# Patient Record
Sex: Female | Born: 1945 | Race: White | Hispanic: No | Smoking: Former smoker
Health system: Southern US, Community
[De-identification: ages and names within clinical notes are randomized; demographics above are authoritative.]

## PROBLEM LIST (undated history)

## (undated) DIAGNOSIS — E039 Hypothyroidism, unspecified: Secondary | ICD-10-CM

## (undated) DIAGNOSIS — I2789 Other specified pulmonary heart diseases: Secondary | ICD-10-CM

## (undated) DIAGNOSIS — I4891 Unspecified atrial fibrillation: Secondary | ICD-10-CM

## (undated) DIAGNOSIS — Z9989 Dependence on other enabling machines and devices: Secondary | ICD-10-CM

## (undated) DIAGNOSIS — I10 Essential (primary) hypertension: Secondary | ICD-10-CM

## (undated) DIAGNOSIS — E662 Morbid (severe) obesity with alveolar hypoventilation: Secondary | ICD-10-CM

## (undated) DIAGNOSIS — E785 Hyperlipidemia, unspecified: Secondary | ICD-10-CM

## (undated) DIAGNOSIS — J449 Chronic obstructive pulmonary disease, unspecified: Secondary | ICD-10-CM

## (undated) DIAGNOSIS — G2581 Restless legs syndrome: Secondary | ICD-10-CM

## (undated) DIAGNOSIS — I5032 Chronic diastolic (congestive) heart failure: Secondary | ICD-10-CM

## (undated) DIAGNOSIS — E119 Type 2 diabetes mellitus without complications: Secondary | ICD-10-CM

## (undated) DIAGNOSIS — G4733 Obstructive sleep apnea (adult) (pediatric): Secondary | ICD-10-CM

## (undated) DIAGNOSIS — I2721 Secondary pulmonary arterial hypertension: Secondary | ICD-10-CM

## (undated) DIAGNOSIS — J45909 Unspecified asthma, uncomplicated: Secondary | ICD-10-CM

## (undated) DIAGNOSIS — I2781 Cor pulmonale (chronic): Secondary | ICD-10-CM

## (undated) HISTORY — DX: Restless legs syndrome: G25.81

## (undated) HISTORY — DX: Hypothyroidism, unspecified: E03.9

## (undated) HISTORY — DX: Chronic obstructive pulmonary disease, unspecified: J44.9

## (undated) HISTORY — DX: Unspecified asthma, uncomplicated: J45.909

## (undated) HISTORY — DX: Dependence on other enabling machines and devices: Z99.89

## (undated) HISTORY — DX: Morbid (severe) obesity with alveolar hypoventilation: E66.2

## (undated) HISTORY — DX: Other specified pulmonary heart diseases: I27.89

## (undated) HISTORY — PX: CARDIOVERSION: SHX1299

## (undated) HISTORY — DX: Unspecified atrial fibrillation: I48.91

## (undated) HISTORY — DX: Chronic diastolic (congestive) heart failure: I50.32

## (undated) HISTORY — DX: Secondary pulmonary arterial hypertension: I27.21

## (undated) HISTORY — DX: Cor pulmonale (chronic): I27.81

## (undated) HISTORY — DX: Obstructive sleep apnea (adult) (pediatric): G47.33

## (undated) HISTORY — DX: Type 2 diabetes mellitus without complications: E11.9

## (undated) HISTORY — DX: Essential (primary) hypertension: I10

## (undated) HISTORY — DX: Hyperlipidemia, unspecified: E78.5

---
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
## 2002-09-05 HISTORY — PX: ABDOMINAL HYSTERECTOMY: SHX81

## 2005-09-05 LAB — HM COLONOSCOPY

## 2009-09-05 LAB — HM MAMMOGRAPHY

## 2012-09-26 ENCOUNTER — Ambulatory Visit (INDEPENDENT_AMBULATORY_CARE_PROVIDER_SITE_OTHER): Payer: Medicare Other | Admitting: Emergency Medicine

## 2012-09-26 ENCOUNTER — Encounter: Payer: Self-pay | Admitting: Emergency Medicine

## 2012-09-26 VITALS — BP 104/68 | HR 86 | Temp 97.0°F | Ht 65.0 in | Wt 282.6 lb

## 2012-09-26 DIAGNOSIS — I10 Essential (primary) hypertension: Secondary | ICD-10-CM

## 2012-09-26 DIAGNOSIS — E662 Morbid (severe) obesity with alveolar hypoventilation: Secondary | ICD-10-CM

## 2012-09-26 DIAGNOSIS — Z9989 Dependence on other enabling machines and devices: Secondary | ICD-10-CM

## 2012-09-26 DIAGNOSIS — R0902 Hypoxemia: Secondary | ICD-10-CM

## 2012-09-26 DIAGNOSIS — R0609 Other forms of dyspnea: Secondary | ICD-10-CM

## 2012-09-26 DIAGNOSIS — J449 Chronic obstructive pulmonary disease, unspecified: Secondary | ICD-10-CM

## 2012-09-26 DIAGNOSIS — I482 Chronic atrial fibrillation, unspecified: Secondary | ICD-10-CM | POA: Insufficient documentation

## 2012-09-26 DIAGNOSIS — R0600 Dyspnea, unspecified: Secondary | ICD-10-CM

## 2012-09-26 DIAGNOSIS — I4891 Unspecified atrial fibrillation: Secondary | ICD-10-CM

## 2012-09-26 DIAGNOSIS — R0989 Other specified symptoms and signs involving the circulatory and respiratory systems: Secondary | ICD-10-CM

## 2012-09-26 DIAGNOSIS — G4733 Obstructive sleep apnea (adult) (pediatric): Secondary | ICD-10-CM

## 2012-09-26 MED ORDER — ROSUVASTATIN CALCIUM 20 MG PO TABS: 20.0000 mg | ORAL_TABLET | Freq: Every day | ORAL | Status: DC

## 2012-09-26 MED ORDER — HYDROCHLOROTHIAZIDE 50 MG PO TABS: 50.0000 mg | ORAL_TABLET | Freq: Every day | ORAL | Status: DC

## 2012-09-26 NOTE — Assessment & Plan Note (Signed)
Severely hypoxic on presentation today (her baseline) - start O2 with an oximizer, titrate by DME company

## 2012-09-26 NOTE — Assessment & Plan Note (Addendum)
-  continue BD's - start O2 - get copies of her PFT from [place] - needs a PCP, will set her up with [place] or [facility]

## 2012-09-26 NOTE — Patient Instructions (Addendum)
Order o2 (needs new DME company) for rest and exertion; also for CPAP maintenance Refer to [facility] Cardiology [facility] Tracey Duncan) and PCP Continue with same bronchodilators Will request same records from MD in [place] Walking oximetry done today and O2 started today Please follow up in One month

## 2012-09-26 NOTE — Progress Notes (Signed)
Subjective:    Patient ID: Tracey Duncan, female    DOB: 07-06-1946, 67 y.o.   MRN: [id number]  HPI 67 yo woman, former tobacco (80 pk-yrs), hx of OSA dx in 4/13 (on CPAP + O2), HTN, A fib (failed cardioversion), allergies. COPD dx in 2002, currently on Advair + Spiriva. Albuterol prn, uses it only . Since moving to  in last 6 months she has had more SOB. She is having occasional wheeze, no cough. No CP.  Significant hypoxemia on arrival. Needs a local PCP and cardiologist   Review of Systems  Constitutional: Negative for fever and unexpected weight change.  HENT: Positive for congestion, rhinorrhea, sneezing, postnasal drip and sinus pressure. Negative for ear pain, nosebleeds, sore throat, trouble swallowing and dental problem.   Eyes: Negative for redness and itching.  Respiratory: Positive for cough and shortness of breath. Negative for chest tightness and wheezing.   Cardiovascular: Positive for palpitations and leg swelling.  Gastrointestinal: Negative for nausea and vomiting.  Genitourinary: Negative for dysuria.  Musculoskeletal: Negative for joint swelling.  Skin: Negative for rash.  Neurological: Negative for headaches.  Hematological: Does not bruise/bleed easily.  Psychiatric/Behavioral: Negative for dysphoric mood. The patient is nervous/anxious.     Past Medical History  Diagnosis Date  . Hypertension   . Atrial fibrillation   . Sinus trouble   . Sleep apnea   . Asthma   . Hyperlipidemia   . COPD (chronic obstructive pulmonary disease)      Family History  Problem Relation Age of Onset  . Emphysema Mother   . Allergies Mother   . Heart disease Father   . Heart disease Brother   . Cancer Paternal Grandmother     tongue     History   Social History  . Marital Status: Widowed    Spouse Name: N/A    Number of Children: 0  . Years of Education: N/A   Occupational History  . retired    Social History Main Topics  . Smoking status: Former Smoker -- 1.5  packs/day for 28 years    Types: Cigarettes    Quit date: 09/06/1991  . Smokeless tobacco: Never Used  . Alcohol Use: Yes     Comment: "very seldom" 09/26/12  . Drug Use: No  . Sexually Active: Not on file   Other Topics Concern  . Not on file   Social History Narrative  . No narrative on file     Allergies  Allergen Reactions  . Volmax (Albuterol)     Pt was told this but is unsure     No outpatient prescriptions prior to visit.    This patient's medications have not been reviewed.       Objective:   Physical Exam Filed Vitals:   09/26/12 1612  BP: 104/68  Pulse: 86  Temp: 97 F (36.1 C)   Gen: Pleasant, obese, in no distress,  normal affect  ENT: No lesions,  mouth clear,  oropharynx clear, no postnasal drip  Neck: No JVD, no TMG, no carotid bruits  Lungs: No use of accessory muscles, clear without rales or rhonchi  Cardiovascular: RRR, heart sounds normal, no murmur or gallops, 1+ peripheral edema  Musculoskeletal: No deformities, no cyanosis or clubbing  Neuro: alert, non focal  Skin: Warm, no lesions or rashes     Assessment & Plan:  OSA on CPAP Will get her a local DME to maintain her CPAP Will get her PSG records from [place]  Hypoxemia Severely  hypoxic on presentation today (her baseline) - start O2 with an oximizer, titrate by DME company   COPD (chronic obstructive pulmonary disease) - continue BD's - start O2 - get copies of her PFT from [place] - needs a PCP, will set her up with [place] or [place]  Atrial fibrillation, chronic Will refer to cardiology to establish care in [place]

## 2012-09-26 NOTE — Assessment & Plan Note (Signed)
Will refer to cardiology to establish care in [place]

## 2012-09-26 NOTE — Assessment & Plan Note (Signed)
Will get her a local DME to maintain her CPAP Will get her PSG records from [place]

## 2012-09-28 ENCOUNTER — Telehealth: Payer: Self-pay | Admitting: Emergency Medicine

## 2012-09-28 NOTE — Telephone Encounter (Signed)
Spoke with patients sister, states patient has not been sleeping well (at night or in the day) prior and especially since concentrator has been placed in home 2 days ago.  Patient using CPAP at night and concentrator throughout day. Sister would like to know if this is normal or if there is anything that can be done to help patient sleep. Also states she "cant really explain" symptoms patient is having.  I have advised to have patient call our office so that we can speak directly with her to know what is going on. Sister states she will have patient call our office to better verbalize symptoms.

## 2012-09-28 NOTE — Telephone Encounter (Signed)
Pt returned call & asked to be reached [phone].  Satira Anis

## 2012-09-28 NOTE — Telephone Encounter (Signed)
She can temporarily try stopping the oxygen and continuing CPAP alone until I get her PSG from [place] and she gets a local DME company - new equipment may solve the noise problem.

## 2012-09-28 NOTE — Telephone Encounter (Signed)
Spoke with pt She states unable to sleep with CPAP and o2 3lpm  She states that the concentrator is too loud and she thinks this is why No problems with tolerating the pressure or mask Any recs?  Please advise, thanks!

## 2012-09-28 NOTE — Telephone Encounter (Signed)
Spoke with the pt and notified of recs per RB Pt verbalized understanding and states nothing further needed

## 2012-10-05 ENCOUNTER — Encounter: Payer: Self-pay | Admitting: *Deleted

## 2012-10-08 ENCOUNTER — Ambulatory Visit (INDEPENDENT_AMBULATORY_CARE_PROVIDER_SITE_OTHER): Payer: Medicare Other | Admitting: Cardiology

## 2012-10-08 ENCOUNTER — Encounter: Payer: Self-pay | Admitting: Cardiology

## 2012-10-08 ENCOUNTER — Other Ambulatory Visit: Payer: Self-pay | Admitting: *Deleted

## 2012-10-08 VITALS — BP 110/64 | HR 87 | Ht 65.0 in | Wt 281.0 lb

## 2012-10-08 DIAGNOSIS — I509 Heart failure, unspecified: Secondary | ICD-10-CM

## 2012-10-08 DIAGNOSIS — R0602 Shortness of breath: Secondary | ICD-10-CM

## 2012-10-08 DIAGNOSIS — I5032 Chronic diastolic (congestive) heart failure: Secondary | ICD-10-CM

## 2012-10-08 DIAGNOSIS — J449 Chronic obstructive pulmonary disease, unspecified: Secondary | ICD-10-CM

## 2012-10-08 DIAGNOSIS — I482 Chronic atrial fibrillation, unspecified: Secondary | ICD-10-CM

## 2012-10-08 DIAGNOSIS — R7301 Impaired fasting glucose: Secondary | ICD-10-CM

## 2012-10-08 DIAGNOSIS — I4891 Unspecified atrial fibrillation: Secondary | ICD-10-CM

## 2012-10-08 LAB — CBC WITH DIFFERENTIAL/PLATELET
Basophils Absolute: 0 10*3/uL (ref 0.0–0.1)
Basophils Relative: 0.4 % (ref 0.0–3.0)
Eosinophils Absolute: 0.1 10*3/uL (ref 0.0–0.7)
HCT: 37.6 % (ref 36.0–46.0)
Hemoglobin: 12.1 g/dL (ref 12.0–15.0)
Lymphocytes Relative: 11.7 % — ABNORMAL LOW (ref 12.0–46.0)
Lymphs Abs: 0.9 10*3/uL (ref 0.7–4.0)
MCHC: 32.3 g/dL (ref 30.0–36.0)
MCV: 87.8 fl (ref 78.0–100.0)
Neutro Abs: 6 10*3/uL (ref 1.4–7.7)
RBC: 4.28 Mil/uL (ref 3.87–5.11)
RDW: 18.2 % — ABNORMAL HIGH (ref 11.5–14.6)

## 2012-10-08 LAB — BRAIN NATRIURETIC PEPTIDE: Pro B Natriuretic peptide (BNP): 364 pg/mL — ABNORMAL HIGH (ref 0.0–100.0)

## 2012-10-08 LAB — LIPID PANEL
HDL: 42.5 mg/dL (ref >39.00)
Total CHOL/HDL Ratio: 2
VLDL: 19.2 mg/dL (ref 0.0–40.0)

## 2012-10-08 LAB — BASIC METABOLIC PANEL
BUN: 32 mg/dL — ABNORMAL HIGH (ref 6–23)
Creatinine, Ser: 1.7 mg/dL — ABNORMAL HIGH (ref 0.4–1.2)
GFR: 31.37 mL/min — ABNORMAL LOW (ref >60.00)
Potassium: 4 mEq/L (ref 3.5–5.1)

## 2012-10-08 MED ORDER — APIXABAN 5 MG PO TABS: 5.0000 mg | ORAL_TABLET | Freq: Two times a day (BID) | ORAL | Status: DC

## 2012-10-08 MED ORDER — APIXABAN 2.5 MG PO TABS: 2.5000 mg | ORAL_TABLET | Freq: Two times a day (BID) | ORAL | Status: DC

## 2012-10-08 MED ORDER — POTASSIUM CHLORIDE CRYS ER 20 MEQ PO TBCR: 20.0000 meq | EXTENDED_RELEASE_TABLET | Freq: Every day | ORAL | Status: DC

## 2012-10-08 MED ORDER — BISOPROLOL FUMARATE 10 MG PO TABS: 10.0000 mg | ORAL_TABLET | Freq: Every day | ORAL | Status: DC

## 2012-10-08 MED ORDER — FUROSEMIDE 40 MG PO TABS: 40.0000 mg | ORAL_TABLET | Freq: Every day | ORAL | Status: DC

## 2012-10-08 NOTE — Progress Notes (Signed)
Patient ID: Tracey Duncan, female   DOB: 1946-06-19, 67 y.o.   MRN: [id number] PCP: Baity  67 yo with history of COPD on home oxygen, OSA (? OHS/OSA), and chronic atrial fibrillation presents to establish outpatient cardiology followup.  Patient moved recently from [place] to [place] and is currently living with her sister.  She was diagnosed with atrial fibrillation around 2004.  She failed cardioversion and it has been chronic.  She is not on anticoagulation though she says that her cardiologist had planned to start her on a NOAC drug.  She has noted exertional dyspnea since at least 2012.  She was diagnosed with COPD back in 2002.  She no longer smokes.  She had been on CPAP for OSA prior, but was started on home oxygen all the time after her last appointment with Dr. Lamonte Sakai where she was found to be hypoxemic.  Her oxygen saturation was in the mid 80s today with her oxygen after walking in the office.  It rose to 98% at rest.  No tachypalpitations, no chest pain.  She reports dyspnea walking up steps or walking about 50 feet on flat ground.  She has chronic lower leg edema.  No history of GI bleeding.   ECG: atrial fibrillation with right axis deviation, low voltage, pulmonary disease pattern.   PMH: 1. OSA (? OHS/OSA): On CPAP at night.  2. COPD: Diagnosed 2002.  Recently started home oxygen.   3. HTN 4. Chronic atrial fibrillation: Failed DCCV in 12/04.   5. Chronic diastolic CHF: Echo (54/98) with EF 60% and enlarged RV. 6. Lexiscan Thallium in 11/11 with no evidence for ischemia or infarction.  7. Impaired fasting glucose. 8. Hypothyroidism 9. Obesity.  SH: Widow, recently moved to [place] from [place] to live with sister.  Quit smoking in 1993.  Rare ETOH. Retired Optometrist.   FH: Brother with MI at 70, father with MI at 4.  ROS: All systems reviewed and negative except as per HPI.   Current Outpatient Prescriptions  Medication Sig Dispense Refill  . albuterol (PROAIR HFA) 108 (90 BASE)  MCG/ACT inhaler Inhale 2 puffs into the lungs every 6 (six) hours as needed.      Marland Kitchen albuterol (PROVENTIL) (2.5 MG/3ML) 0.083% nebulizer solution Take 2.5 mg by nebulization every 6 (six) hours as needed.      . Calcium Carb-Cholecalciferol (CALCIUM + D3 PO) Take 1 tablet by mouth 2 (two) times daily.       . cetirizine (ZYRTEC) 10 MG tablet Take 10 mg by mouth daily.      . Cholecalciferol (VITAMIN D PO) Take 1.25 mg by mouth once a week.      . Fluticasone-Salmeterol (ADVAIR) 250-50 MCG/DOSE AEPB Inhale 1 puff into the lungs every 12 (twelve) hours.      Marland Kitchen ibuprofen (ADVIL,MOTRIN) 800 MG tablet Take 800 mg by mouth every 8 (eight) hours as needed.      . irbesartan (AVAPRO) 300 MG tablet Take 300 mg by mouth daily.       Marland Kitchen levothyroxine (LEVOTHROID) 50 MCG tablet Take 50 mcg by mouth daily.      . metFORMIN (GLUCOPHAGE) 500 MG tablet Take 500 mg by mouth 2 (two) times daily with a meal.      . montelukast (SINGULAIR) 10 MG tablet Take 10 mg by mouth at bedtime.      . Multiple Vitamin (MULTIVITAMIN) tablet Take 1 tablet by mouth daily.      . NON FORMULARY CPAP w 3L o2  each night      . rosuvastatin (CRESTOR) 20 MG tablet Take 1 tablet (20 mg total) by mouth daily.  30 tablet  6  . tiotropium (SPIRIVA) 18 MCG inhalation capsule Place 18 mcg into inhaler and inhale daily.      Marland Kitchen apixaban (ELIQUIS) 2.5 MG TABS tablet Take 1 tablet (2.5 mg total) by mouth 2 (two) times daily.  60 tablet  1  . bisoprolol (ZEBETA) 10 MG tablet Take 1 tablet (10 mg total) by mouth daily.  30 tablet  6  . furosemide (LASIX) 40 MG tablet Take 1 tablet (40 mg total) by mouth daily.  30 tablet  6  . potassium chloride SA (K-DUR,KLOR-CON) 20 MEQ tablet Take 1 tablet (20 mEq total) by mouth daily.  30 tablet  6    BP 110/64  Pulse 87  Ht _0  (1.651 m)  Wt 281 lb (127.461 kg)  BMI 46.76 kg/m2  SpO2 98% General: NAD, obese.  Neck:JVP 10-12 cm, no thyromegaly or thyroid nodule.  Lungs: Distant breath sounds  bilaterally.  CV: Nondisplaced PMI.  Heart irregular S1/S2, no S3/S4, no murmur.  1+ edema to knees bilaterally, L>R.  No carotid bruit.   Abdomen: Soft, nontender, no hepatosplenomegaly, no distention.  Skin: Intact without lesions or rashes.  Neurologic: Alert and oriented x 3.  Psych: Normal affect. Extremities: No clubbing or cyanosis.  HEENT: Normal.   Assessment/Plan: 1. Atrial fibrillation: Chronic x years.  She would be unlikely to remain in NSR if cardioversion were attempted.  Plan rate control.  CHADSVASC = 3 at least.  She should be anticoagulated.  No history of GI bleeding. - Start apixaban => will get CBC and BMET today prior to starting the medication.   - She can stop aspirin after starting apixaban.  - Given COPD history, would use bisoprolol (more beta-1 selective) rather than Coreg for rate control.  I will stop Coreg today and start bisoprolol 10 mg daily.  2. CHF: Chronic diastolic CHF.  She is volume overloaded on exam.   She has dyspnea that is likely a combination of COPD, ? OHS, and CHF.  Last echo report from prior cardiologist reports an "enlarged RV."  I suspect that she has cor pulmonale.  She was just started on home oxygen but may have been hypoxemic for a long period prior, leading to pulmonary vasoconstriction, pulmonary arterial hypertension, and RV failure.   - Echocardiogram - Stop HCTZ and start Lasix 40 mg daily + KCl 20 mEq daily.  - Needs BMET and BNP checked today.  3. Family history of premature CAD: Negative stress test in 11/11.  She is on Crestor.  I will check lipids.   [name] 10/08/2012

## 2012-10-08 NOTE — Patient Instructions (Signed)
Stop coreg(carvedilol).   Start bisoprolol 68m daily.  Stop HCTZ(hydrochlorothiazide).   Start lasix(furosemide) 424mdaily,   Start KCL(potassium) 20 mEq daily.  Stop aspirin. Do not stop until we have your lab results.   Start Eliquis 26m26mapixiban)  two times a day the day after you stop aspirin.   Your physician recommends that you have lab work today--BMET/BNP/CBCd/HGB A1c/ Lipid profile.   Your physician has requested that you have an echocardiogram. Echocardiography is a painless test that uses sound waves to create images of your heart. It provides your doctor with information about the size and shape of your heart and how well your heart's chambers and valves are working. This procedure takes approximately one hour. There are no restrictions for this procedure.  Your physician recommends that you schedule a follow-up appointment in: 3 weeks with Dr McLAundra Dubinour physician recommends that you schedule a follow-up appointment in: Coumadin Clinic 1 month after you start Eliquis.

## 2012-10-17 ENCOUNTER — Encounter: Payer: Self-pay | Admitting: Cardiology

## 2012-10-22 ENCOUNTER — Other Ambulatory Visit (INDEPENDENT_AMBULATORY_CARE_PROVIDER_SITE_OTHER): Payer: Medicare Other

## 2012-10-22 ENCOUNTER — Ambulatory Visit (HOSPITAL_COMMUNITY): Payer: Medicare Other | Attending: Internal Medicine | Admitting: Radiology

## 2012-10-22 DIAGNOSIS — R0989 Other specified symptoms and signs involving the circulatory and respiratory systems: Secondary | ICD-10-CM | POA: Insufficient documentation

## 2012-10-22 DIAGNOSIS — I4891 Unspecified atrial fibrillation: Secondary | ICD-10-CM | POA: Insufficient documentation

## 2012-10-22 DIAGNOSIS — I5032 Chronic diastolic (congestive) heart failure: Secondary | ICD-10-CM

## 2012-10-22 DIAGNOSIS — R0602 Shortness of breath: Secondary | ICD-10-CM

## 2012-10-22 DIAGNOSIS — J4489 Other specified chronic obstructive pulmonary disease: Secondary | ICD-10-CM | POA: Insufficient documentation

## 2012-10-22 DIAGNOSIS — G4733 Obstructive sleep apnea (adult) (pediatric): Secondary | ICD-10-CM | POA: Insufficient documentation

## 2012-10-22 DIAGNOSIS — I509 Heart failure, unspecified: Secondary | ICD-10-CM | POA: Insufficient documentation

## 2012-10-22 DIAGNOSIS — R0609 Other forms of dyspnea: Secondary | ICD-10-CM | POA: Insufficient documentation

## 2012-10-22 DIAGNOSIS — J449 Chronic obstructive pulmonary disease, unspecified: Secondary | ICD-10-CM | POA: Insufficient documentation

## 2012-10-22 LAB — BASIC METABOLIC PANEL
CO2: 29 mEq/L (ref 19–32)
Chloride: 104 mEq/L (ref 96–112)
Potassium: 4.1 mEq/L (ref 3.5–5.1)
Sodium: 143 mEq/L (ref 135–145)

## 2012-10-22 NOTE — Progress Notes (Signed)
Echocardiogram performed.  

## 2012-10-23 ENCOUNTER — Telehealth: Payer: Self-pay | Admitting: *Deleted

## 2012-10-23 NOTE — Telephone Encounter (Signed)
Spoke with patient about test results. Pt states she had gotten a letter from [employer] that Eliquis need prior authorization. I spoke with Malachy Mood at [place]. Eliquis 2.51m bid approved for 1 year. Pt aware.

## 2012-10-25 ENCOUNTER — Ambulatory Visit (INDEPENDENT_AMBULATORY_CARE_PROVIDER_SITE_OTHER): Payer: Medicare Other | Admitting: Internal Medicine

## 2012-10-25 ENCOUNTER — Encounter: Payer: Self-pay | Admitting: Internal Medicine

## 2012-10-25 ENCOUNTER — Other Ambulatory Visit (INDEPENDENT_AMBULATORY_CARE_PROVIDER_SITE_OTHER): Payer: Medicare Other

## 2012-10-25 VITALS — BP 128/68 | HR 92 | Temp 98.5°F | Ht 65.0 in | Wt 274.5 lb

## 2012-10-25 DIAGNOSIS — R7303 Prediabetes: Secondary | ICD-10-CM | POA: Insufficient documentation

## 2012-10-25 DIAGNOSIS — B372 Candidiasis of skin and nail: Secondary | ICD-10-CM | POA: Insufficient documentation

## 2012-10-25 DIAGNOSIS — M899 Disorder of bone, unspecified: Secondary | ICD-10-CM

## 2012-10-25 DIAGNOSIS — M8580 Other specified disorders of bone density and structure, unspecified site: Secondary | ICD-10-CM | POA: Insufficient documentation

## 2012-10-25 DIAGNOSIS — E039 Hypothyroidism, unspecified: Secondary | ICD-10-CM | POA: Insufficient documentation

## 2012-10-25 DIAGNOSIS — Z1239 Encounter for other screening for malignant neoplasm of breast: Secondary | ICD-10-CM

## 2012-10-25 DIAGNOSIS — J3489 Other specified disorders of nose and nasal sinuses: Secondary | ICD-10-CM

## 2012-10-25 DIAGNOSIS — R7309 Other abnormal glucose: Secondary | ICD-10-CM

## 2012-10-25 DIAGNOSIS — L299 Pruritus, unspecified: Secondary | ICD-10-CM

## 2012-10-25 LAB — TSH: TSH: 4.54 u[IU]/mL (ref 0.35–5.50)

## 2012-10-25 MED ORDER — METFORMIN HCL 500 MG PO TABS: 500.0000 mg | ORAL_TABLET | Freq: Two times a day (BID) | ORAL | Status: DC

## 2012-10-25 MED ORDER — HYDROXYZINE HCL 10 MG PO TABS: 10.0000 mg | ORAL_TABLET | Freq: Three times a day (TID) | ORAL | Status: DC | PRN

## 2012-10-25 MED ORDER — KETOCONAZOLE 2 % EX CREA: TOPICAL_CREAM | Freq: Every day | CUTANEOUS | Status: DC

## 2012-10-25 MED ORDER — MUPIROCIN 2 % EX OINT: TOPICAL_OINTMENT | Freq: Three times a day (TID) | CUTANEOUS | Status: DC

## 2012-10-25 NOTE — Assessment & Plan Note (Signed)
Dexa scan normal Will recheck Vit D level today

## 2012-10-25 NOTE — Assessment & Plan Note (Signed)
Wash and dry the area thourougly eRx for ketoconazole cream

## 2012-10-25 NOTE — Progress Notes (Signed)
HPI  Pt presents to the clinic to establish care. Tracey Duncan recently moved to the are from [place]. Tracey Duncan is in need of some medication refills today. Tracey Duncan does c/o generalized itching. This is a chronic problem for her. Tracey Duncan admits to not moisturizing as well as Tracey Duncan could. Tracey Duncan does have dry have dry skin in the winter. Tracey Duncan wonders if there is a pill Tracey Duncan can take for itching.  Flu: 09/2012 Pneumonia: unsure Tetanus: unsure Shingles: 2010 Colonoscopy: 2007 Mammogram: 2011 Pap Smear: hysterectomy  Past Medical History  Diagnosis Date  . Hypertension   . Atrial fibrillation     failed cardioversion  . Sinus trouble   . OSA on CPAP   . Asthma   . Hyperlipidemia   . COPD (chronic obstructive pulmonary disease)   . Thyroid disease     Current Outpatient Prescriptions  Medication Sig Dispense Refill  . albuterol (PROAIR HFA) 108 (90 BASE) MCG/ACT inhaler Inhale 2 puffs into the lungs every 6 (six) hours as needed.      Marland Kitchen albuterol (PROVENTIL) (2.5 MG/3ML) 0.083% nebulizer solution Take 2.5 mg by nebulization every 6 (six) hours as needed.      Marland Kitchen apixaban (ELIQUIS) 2.5 MG TABS tablet Take 1 tablet (2.5 mg total) by mouth 2 (two) times daily.  60 tablet  1  . bisoprolol (ZEBETA) 10 MG tablet Take 1 tablet (10 mg total) by mouth daily.  30 tablet  6  . calcium-vitamin D (OSCAL WITH D) 500-200 MG-UNIT per tablet Take 1 tablet by mouth daily.      . cetirizine (ZYRTEC) 10 MG tablet Take 10 mg by mouth daily.      . Cholecalciferol (VITAMIN D PO) Take 1.25 mg by mouth once a week.      . Fluticasone-Salmeterol (ADVAIR) 250-50 MCG/DOSE AEPB Inhale 1 puff into the lungs every 12 (twelve) hours.      . furosemide (LASIX) 40 MG tablet Take 1 tablet (40 mg total) by mouth daily.  30 tablet  6  . hydrOXYzine (ATARAX/VISTARIL) 10 MG tablet Take 1 tablet (10 mg total) by mouth 3 (three) times daily as needed for itching.  30 tablet  0  . ibuprofen (ADVIL,MOTRIN) 800 MG tablet Take 800 mg by mouth every 8  (eight) hours as needed.      . irbesartan (AVAPRO) 300 MG tablet Take 300 mg by mouth daily.       Marland Kitchen levothyroxine (LEVOTHROID) 50 MCG tablet Take 50 mcg by mouth daily.      . metFORMIN (GLUCOPHAGE) 500 MG tablet Take 1 tablet (500 mg total) by mouth 2 (two) times daily with a meal.  60 tablet  2  . montelukast (SINGULAIR) 10 MG tablet Take 10 mg by mouth at bedtime.      . Multiple Vitamin (MULTIVITAMIN) tablet Take 1 tablet by mouth daily.      . NON FORMULARY CPAP w 3L o2 each night      . potassium chloride SA (K-DUR,KLOR-CON) 20 MEQ tablet Take 1 tablet (20 mEq total) by mouth daily.  30 tablet  6  . rosuvastatin (CRESTOR) 20 MG tablet Take 1 tablet (20 mg total) by mouth daily.  30 tablet  6  . tiotropium (SPIRIVA) 18 MCG inhalation capsule Place 18 mcg into inhaler and inhale daily.      Marland Kitchen ketoconazole (NIZORAL) 2 % cream Apply topically daily.  15 g  0  . mupirocin ointment (BACTROBAN) 2 % Apply topically 3 (three) times daily.  22 g  0   No current facility-administered medications for this visit.    Allergies  Allergen Reactions  . Volmax (Albuterol)     Pt was told this but is unsure    Family History  Problem Relation Age of Onset  . Emphysema Mother   . Allergies Mother   . Arthritis Mother   . Heart disease Father   . Heart disease Brother   . Tongue cancer Paternal Grandmother   . Cancer Cousin     Ovarian Cancer  . Cancer Cousin     Breast Cancer    History   Social History  . Marital Status: Widowed    Spouse Name: N/A    Number of Children: 0  . Years of Education: N/A   Occupational History  . retired     Optometrist   Social History Main Topics  . Smoking status: Former Smoker -- 1.50 packs/day for 28 years    Types: Cigarettes    Quit date: 09/06/1991  . Smokeless tobacco: Never Used  . Alcohol Use: No     Comment: "very seldom" 09/26/12  . Drug Use: No  . Sexually Active: Not on file   Other Topics Concern  . Not on file   Social  History Narrative   Regular exercise-no   Caffeine Use-yes          ROS:  Constitutional: Pt reports fatigue. Denies fever, malaise, headache or abrupt weight changes.  HEENT: Denies eye pain, eye redness, ear pain, ringing in the ears, wax buildup, runny nose, nasal congestion, bloody nose, or sore throat. Respiratory:  Pt reports difficulty breathing. Tracey Duncan does wear 3 L [place]. Denies difficulty breathing, shortness of breath, cough or sputum production.   Cardiovascular: Denies chest pain, chest tightness, palpitations or swelling in the hands or feet.  Gastrointestinal: Denies abdominal pain, bloating, constipation, diarrhea or blood in the stool.  GU: Denies frequency, urgency, pain with urination, blood in urine, odor or discharge. Musculoskeletal: Denies decrease in range of motion, difficulty with gait, muscle pain or joint pain and swelling.  Skin: Denies redness, rashes, lesions or ulcercations.  Neurological: Denies dizziness, difficulty with memory, difficulty with speech or problems with balance and coordination.   No other specific complaints in a complete review of systems (except as listed in HPI above).  PE:  BP 128/68  Pulse 92  Temp(Src) 98.5 F (36.9 C) (Oral)  Ht _0  (1.651 m)  Wt 274 lb 8 oz (124.512 kg)  BMI 45.68 kg/m2  SpO2 90% Wt Readings from Last 3 Encounters:  10/25/12 274 lb 8 oz (124.512 kg)  10/08/12 281 lb (127.461 kg)  09/26/12 282 lb 9.6 oz (128.187 kg)    General: Appears her stated age, obese but well developed, well nourished in NAD. HEENT: Head: normal shape and size; Eyes: sclera white, no icterus, conjunctiva pink, PERRLA and EOMs intact; Ears: Tm's gray and intact, normal light reflex; Nose: mucosa pink and moist, septum midline; Throat/Mouth: Teeth present, mucosa pink and moist, no lesions or ulcerations noted.  Neck: Normal range of motion. Neck supple, trachea midline. No massses, lumps or thyromegaly present.  Cardiovascular: Normal  rate and rhythm. S1,S2 noted.  No murmur, rubs or gallops noted. No JVD or BLE edema. No carotid bruits noted. Pulmonary/Chest: Normal effort and positive vesicular breath sounds. Mild respiratory distress. No wheezes, rales or ronchi noted.  Abdomen: Soft and nontender. Normal bowel sounds, no bruits noted. No distention or masses noted. Liver, spleen and  kidneys non palpable. Musculoskeletal: Normal range of motion. No signs of joint swelling. No difficulty with gait.  Neurological: Alert and oriented. Cranial nerves II-XII intact. Coordination normal. +DTRs bilaterally. Psychiatric: Mood and affect normal. Behavior is normal. Judgment and thought content normal.     BMET    Component Value Date/Time   NA 143 10/22/2012 1535   K 4.1 10/22/2012 1535   CL 104 10/22/2012 1535   CO2 29 10/22/2012 1535   GLUCOSE 101* 10/22/2012 1535   BUN 28* 10/22/2012 1535   CREATININE 1.7* 10/22/2012 1535   CALCIUM 10.0 10/22/2012 1535    Lipid Panel     Component Value Date/Time   CHOL 95 10/08/2012 1131   TRIG 96.0 10/08/2012 1131   HDL 42.50 10/08/2012 1131   CHOLHDL 2 10/08/2012 1131   VLDL 19.2 10/08/2012 1131   LDLCALC 33 10/08/2012 1131    CBC    Component Value Date/Time   WBC 7.4 10/08/2012 1131   RBC 4.28 10/08/2012 1131   HGB 12.1 10/08/2012 1131   HCT 37.6 10/08/2012 1131   PLT 130.0* 10/08/2012 1131   MCV 87.8 10/08/2012 1131   MCHC 32.3 10/08/2012 1131   RDW 18.2* 10/08/2012 1131   LYMPHSABS 0.9 10/08/2012 1131   MONOABS 0.4 10/08/2012 1131   EOSABS 0.1 10/08/2012 1131   BASOSABS 0.0 10/08/2012 1131    Hgb A1C Lab Results  Component Value Date   HGBA1C 6.2 10/08/2012     Assessment and Plan:  Itching, new onset with additional workup required:  Apply moisturizer as needed eRx for Hydroxyzine  Preventative health maintenance:  Will obtain mammogram Start a diet and exercise program  RTC in 6 months for f/u

## 2012-10-25 NOTE — Assessment & Plan Note (Signed)
Will recheck TSH today Will refill synthroid after lab results back. Will titrate as needed

## 2012-10-25 NOTE — Patient Instructions (Signed)
Itching Itching is a symptom that can be caused by many things. These include skin problems (including infections) as well as some internal diseases.  If the itching is affecting just one area of the body, it is most likely due to a common skin problem, such as:  Poison oak and poison ivy.  Contact dermatitis (skin irritation from a plant, chemicals, fiberglass, detergents, new cosmetic, new jewelry, or other substance).  Fungus (such as athlete's foot, jock itch, or ringworm).  Head lice  Dandruff  Insect bite  Infection (such as Shingles or other virus infections). If the itching is all over (widespread), the possible causes are many. These include:   Dry skin or eczema  Heat rash  Hives  Liver disorders  Kidney disorders TREATMENT  Localized itching   Lubrication of the skin. Use an ointment or cream or other unperfumed moisturizers if the skin is dry. Apply frequently, especially after bathing.  Anti-itch medicines. These medications may help control the urge to scratch. Scratching always makes itching worse and increases the chance of getting an infection.  Cortisone creams and ointments. These help reduce the inflammation.  Antibiotics. Skin infections can cause itching. Topical or oral antibiotics may be needed for 10 to 20 days to get rid of an infection. If you can identify what caused the itching, avoid this substance in the future.  Widespread itching  The following measures may help to relieve itching regardless of the cause:   Wash the skin once with soap to remove irritants.  Bathe in tepid water with baking soda, cornstarch, or oatmeal.  Use calamine lotion (nonprescription) or a baking soda solution (1 teaspoon in 4 ounces of water on the skin).  Apply 1% hydrocortisone cream (no prescription needed). Do not use this if there might be a skin infection.  Avoid scratching.  Avoid itchy or tight-fitting clothes.  Avoid excessive heat, sweating,  scented soaps, and swimming pools.  The lubricants, anti-itch medicines, etc. noted above may be helpful for controlling symptoms. SEEK MEDICAL CARE IF:   The itching becomes severe.  Your itch is not better after 1 week of treatment. Contact your caregiver to schedule further evaluation. Document Released: 08/22/2005 Document Revised: 11/14/2011 Document Reviewed: 02/09/2007 [facility] Patient Information 2013 [place].

## 2012-10-25 NOTE — Assessment & Plan Note (Signed)
Will check HgbA1C today Will refill Metformin today

## 2012-10-25 NOTE — Assessment & Plan Note (Signed)
Likely due to un humidified oxygen via [place] eRx for bactroban ointment

## 2012-10-29 ENCOUNTER — Other Ambulatory Visit: Payer: Self-pay

## 2012-10-29 MED ORDER — LEVOTHYROXINE SODIUM 50 MCG PO TABS: 50.0000 ug | ORAL_TABLET | Freq: Every day | ORAL | Status: DC

## 2012-10-30 ENCOUNTER — Telehealth: Payer: Self-pay | Admitting: *Deleted

## 2012-10-30 ENCOUNTER — Ambulatory Visit (INDEPENDENT_AMBULATORY_CARE_PROVIDER_SITE_OTHER): Payer: Medicare Other | Admitting: Emergency Medicine

## 2012-10-30 ENCOUNTER — Encounter: Payer: Self-pay | Admitting: Emergency Medicine

## 2012-10-30 VITALS — BP 112/62 | HR 72 | Temp 98.5°F | Ht 65.0 in | Wt 274.0 lb

## 2012-10-30 DIAGNOSIS — R0902 Hypoxemia: Secondary | ICD-10-CM

## 2012-10-30 DIAGNOSIS — J449 Chronic obstructive pulmonary disease, unspecified: Secondary | ICD-10-CM

## 2012-10-30 DIAGNOSIS — I2789 Other specified pulmonary heart diseases: Secondary | ICD-10-CM

## 2012-10-30 DIAGNOSIS — IMO0002 Reserved for concepts with insufficient information to code with codable children: Secondary | ICD-10-CM | POA: Insufficient documentation

## 2012-10-30 DIAGNOSIS — G4733 Obstructive sleep apnea (adult) (pediatric): Secondary | ICD-10-CM

## 2012-10-30 DIAGNOSIS — Z9989 Dependence on other enabling machines and devices: Secondary | ICD-10-CM

## 2012-10-30 MED ORDER — VITAMIN D 50 MCG (2000 UT) PO CAPS: 1.0000 | ORAL_CAPSULE | Freq: Every day | ORAL | Status: DC

## 2012-10-30 MED ORDER — MONTELUKAST SODIUM 10 MG PO TABS: 10.0000 mg | ORAL_TABLET | Freq: Every day | ORAL | Status: DC

## 2012-10-30 MED ORDER — TIOTROPIUM BROMIDE MONOHYDRATE 18 MCG IN CAPS: 18.0000 ug | ORAL_CAPSULE | Freq: Every day | RESPIRATORY_TRACT | Status: DC

## 2012-10-30 NOTE — Telephone Encounter (Signed)
Ok to refill 

## 2012-10-30 NOTE — Telephone Encounter (Signed)
Rx sent to [facility], pt informed.

## 2012-10-30 NOTE — Assessment & Plan Note (Signed)
Severe hypoxemia that is out of proportion to her OSA/OHS, likely also due to COPD and newly identified secondary PAH . Currently on 6-8L/min via oximizer, will need to consider duel tanks or a portable concentrator

## 2012-10-30 NOTE — Patient Instructions (Addendum)
Please continue your oxygen at 6L/min at rest and 8L/min with activity Continue your inhaled medications as you are taking them  We will set up a CPAP titration study at your home We will perform full pulmonary function testing at your next office visit Follow with Dr Lamonte Sakai in 1 month with full PFT

## 2012-10-30 NOTE — Progress Notes (Signed)
  Subjective:  Patient ID: Tracey Duncan, female    DOB: Aug 20, 1946, 67 y.o.   MRN: [id number] HPI 67 yo woman, former tobacco (56 pk-yrs), hx of OSA dx in 4/13 (on CPAP + O2), HTN, A fib (failed cardioversion), allergies. COPD dx in 2002, currently on Advair + Spiriva. Albuterol prn, uses it only . Since moving to [place] in last 6 months she has had more SOB. She is having occasional wheeze, no cough. No CP.  Significant hypoxemia on arrival. Needs a local PCP and cardiologist  ROV 10/30/12 -- COPD, OSA, allergies, A Fib, HTN. She was seen by Dr Aundra Dubin and started anticoagulation for her A fib. Also had TTE that showed PAH with PASP ~ 26mHg. She is wearing her O2 reliably - has a documented desat on O2 at Dr MClaris [facility]addenoffice. She is ordered for 6L and 8L with exertion.  Currently on advair, spiriva. Singulair + zyrtec.      Objective:   Physical Exam Filed Vitals:   10/30/12 1629  BP: 112/62  Pulse: 72  Temp: 98.5 F (36.9 C)    Gen: Pleasant, obese, in no distress,  normal affect  ENT: No lesions,  mouth clear,  oropharynx clear, no postnasal drip  Neck: No JVD, no TMG, no carotid bruits  Lungs: No use of accessory muscles, clear without rales or rhonchi  Cardiovascular: RRR, heart sounds normal, no murmur or gallops, 1+ peripheral edema  Musculoskeletal: No deformities, no cyanosis or clubbing  Neuro: alert, non focal  Skin: Warm, no lesions or rashes   10/22/12 --  Study Conclusions  - Left ventricle: The cavity size was normal. Wall thickness was normal. Systolic function was normal. The estimated ejection fraction was in the range of 55% to 60%. Wall motion was normal; there were no regional wall motion abnormalities. - Left atrium: The atrium was mildly dilated. - Right ventricle: The cavity size was moderately dilated. Systolic function was moderately reduced. - Right atrium: The atrium was moderately dilated. - Pulmonary arteries: Systolic pressure was moderately  to severely increased. PA peak pressure: 689mHg (S).      Assessment & Plan:  OSA on CPAP Auto-titration study with ONO, then adjust CPAP accordingly  COPD (chronic obstructive pulmonary disease) Continue current BD's and O2 Full PFT next visit  Hypoxemia Severe hypoxemia that is out of proportion to her OSA/OHS, likely also due to COPD and newly identified secondary PAH . Currently on 6-8L/min via oximizer, will need to consider duel tanks or a portable concentrator

## 2012-10-30 NOTE — Telephone Encounter (Signed)
Pt is requesting refill of prescription Vitamin D to go [facility].

## 2012-10-30 NOTE — Assessment & Plan Note (Signed)
Auto-titration study with ONO, then adjust CPAP accordingly

## 2012-10-30 NOTE — Assessment & Plan Note (Addendum)
Continue current BD's and O2 Full PFT next visit

## 2012-11-02 ENCOUNTER — Telehealth: Payer: Self-pay | Admitting: Emergency Medicine

## 2012-11-02 DIAGNOSIS — G4733 Obstructive sleep apnea (adult) (pediatric): Secondary | ICD-10-CM

## 2012-11-02 NOTE — Telephone Encounter (Signed)
Spoke with patient, she states she still has not heard from [facility] to set up for CPAP titration study. Saw in referrals that [facility] had spoke with Melissa from [facility]-- spoke with a rep that stated she did not see documentation about this study and gave me Lenna Sciara number @ [phone] Lenna Sciara stated she will check on this and call back.  Will await call, patient is aware of the above and verbalized understanding of this.

## 2012-11-02 NOTE — Telephone Encounter (Signed)
Spoke with Philemon Kingdom states patient does not  Get CPAP supplies only o2 from [facility]. Spoke with patient, patient CPAP is still be serviced from old DME in [place]. Patient needs order for new DME for cpap support and supplies.  Patients old DME company: [facility] [phone]  Or [phone]  Will forward to [facility] as well.

## 2012-11-05 NOTE — Telephone Encounter (Signed)
Spoke with Tracey Duncan this am informed her order for Cpap in her box.  She needs auto range that is needed for this cpap titration study, Can Dr Lamonte Sakai please put on order auto range?

## 2012-11-05 NOTE — Telephone Encounter (Signed)
[name] liasion has order  .Tracey Duncan

## 2012-11-05 NOTE — Telephone Encounter (Signed)
Auto Range- 5-20-- order placed

## 2012-11-08 ENCOUNTER — Ambulatory Visit (INDEPENDENT_AMBULATORY_CARE_PROVIDER_SITE_OTHER): Payer: Medicare Other | Admitting: Cardiology

## 2012-11-08 ENCOUNTER — Ambulatory Visit (INDEPENDENT_AMBULATORY_CARE_PROVIDER_SITE_OTHER): Payer: Medicare Other

## 2012-11-08 ENCOUNTER — Encounter: Payer: Self-pay | Admitting: Cardiology

## 2012-11-08 VITALS — BP 110/70 | HR 93 | Ht 65.0 in | Wt 277.0 lb

## 2012-11-08 DIAGNOSIS — I5032 Chronic diastolic (congestive) heart failure: Secondary | ICD-10-CM

## 2012-11-08 DIAGNOSIS — R0602 Shortness of breath: Secondary | ICD-10-CM

## 2012-11-08 DIAGNOSIS — I2781 Cor pulmonale (chronic): Secondary | ICD-10-CM

## 2012-11-08 DIAGNOSIS — E662 Morbid (severe) obesity with alveolar hypoventilation: Secondary | ICD-10-CM

## 2012-11-08 DIAGNOSIS — I4891 Unspecified atrial fibrillation: Secondary | ICD-10-CM

## 2012-11-08 DIAGNOSIS — Z7901 Long term (current) use of anticoagulants: Secondary | ICD-10-CM

## 2012-11-08 DIAGNOSIS — I279 Pulmonary heart disease, unspecified: Secondary | ICD-10-CM

## 2012-11-08 DIAGNOSIS — I482 Chronic atrial fibrillation, unspecified: Secondary | ICD-10-CM

## 2012-11-08 MED ORDER — FUROSEMIDE 20 MG PO TABS: ORAL_TABLET | ORAL | Status: DC

## 2012-11-08 MED ORDER — POTASSIUM CHLORIDE CRYS ER 20 MEQ PO TBCR: EXTENDED_RELEASE_TABLET | ORAL | Status: DC

## 2012-11-08 MED ORDER — IRBESARTAN 300 MG PO TABS: 150.0000 mg | ORAL_TABLET | Freq: Every day | ORAL | Status: DC

## 2012-11-08 NOTE — Patient Instructions (Addendum)
Your physician has recommended you make the following change in your medication: decrease Avapro to 150 mg daily, Lasix to 40 mg in the mornings and 20 mg in the evening and Potassium to 20 meq in the mornings and 40 meq in the evening.  Your physician recommends that you return for lab work in: 3/17 any time that day  Your physician recommends that you schedule a follow-up appointment in: 1 month

## 2012-11-08 NOTE — Patient Instructions (Addendum)
A full discussion of the nature of anticoagulants has been carried out.  A benefit/risk analysis has been presented to the patient, so that they understand the justification for choosing anticoagulation with Eliquis at this time.  The need for compliance is stressed.  Pt is aware to take the medication twice daily.  Side effects of potential bleeding are discussed, including unusual colored urine or stools, coughing up blood or coffee ground emesis, nose bleeds or serious fall or head trauma.  Discussed signs and symptoms of stroke. The patient should avoid any OTC items containing aspirin or ibuprofen.  Avoid alcohol consumption.   Call if any signs of abnormal bleeding.  Discussed financial obligations and resolved any difficulty in obtaining medication.  Next lab test test in 6 months.

## 2012-11-08 NOTE — Progress Notes (Signed)
Pt was started on Eliquis 2.75m BID for atrial fibrillation on 10/08/12 by Dr MAundra Dubin  Reviewed patients medication list.  Pt is not currently on any combined P-gp and strong CYP3A4 inhibitors/inducers (ketoconazole, traconazole, ritonavir, carbamazepine, phenytoin, rifampin, St. John's wort).  Reviewed labs.  SCr 2.4, Weight 117 kg.  Dose inappropriate based on weight, age, and serum creatine.   Hgb and HCT  Decreased from 12.1 10/08/12 to 10.8 12/05/12. Discussed with Dr MAundra Dubin  per his instructions pt to remain on 2.516mBID given decr renal fx and drop in Hgb.

## 2012-11-09 ENCOUNTER — Other Ambulatory Visit: Payer: Self-pay | Admitting: *Deleted

## 2012-11-09 DIAGNOSIS — I2781 Cor pulmonale (chronic): Secondary | ICD-10-CM | POA: Insufficient documentation

## 2012-11-09 DIAGNOSIS — I272 Pulmonary hypertension, unspecified: Secondary | ICD-10-CM

## 2012-11-09 NOTE — Progress Notes (Signed)
Patient ID: Tracey Duncan, female   DOB: 1945-11-22, 67 y.o.   MRN: [id number] PCP: Baity  67 yo with history of COPD on home oxygen, OSA (? OHS/OSA), and chronic atrial fibrillation presents for cardiology followup.  Patient moved recently from [place] to [place] and is currently living with her sister.  She was diagnosed with atrial fibrillation around 2004.  She failed cardioversion and it has been chronic.  She has noted exertional dyspnea since at least 2012.  She was diagnosed with COPD back in 2002.  She no longer smokes.  She had been on CPAP for OSA prior, but was started on home oxygen all the time by Dr. Lamonte Sakai.  Her oxygen saturation was in the mid 80s again today with her oxygen after walking in the office.  It rose to 98% at rest.  No tachypalpitations, no chest pain.  She reports dyspnea walking up steps or walking about 50 feet on flat ground.  She has chronic lower leg edema.    At last appointment, I started her on Lasix.  Weight is down 4 lbs and her edema has decreased a little but she has not noted much difference in her breathing.  Echo showed a moderately dilated RV with moderately decreased systolic function, PA systolic pressure 65 mmHg. She has started on low-dose apixaban for atrial fibrillation (dosed at 2.5 mg bid due to elevated creatinine).     PMH: 1. OSA/OHS: On CPAP at night and oxygen during the day. 2. COPD: Diagnosed 2002.   3. HTN 4. Chronic atrial fibrillation: Failed DCCV in 12/04.   5. 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  Echo (2/14) with EF 55-60%, moderately dilated RV with moderately decreased systolic function, PA systolic pressure 65 mmHg.   6. Lexiscan Thallium in 11/11 with no evidence for ischemia or infarction.  7. Impaired fasting glucose. 8. Hypothyroidism 9. Obesity. 10. CKD  SH: Widow, recently moved to [place] from [place] to live with sister.  Quit smoking in 1993.  Rare ETOH. Retired Optometrist.    FH: Brother with MI at 80, father with MI at 74.  ROS: All systems reviewed and negative except as per HPI.   Current Outpatient Prescriptions  Medication Sig Dispense Refill  . albuterol (PROAIR HFA) 108 (90 BASE) MCG/ACT inhaler Inhale 2 puffs into the lungs every 6 (six) hours as needed.      Marland Kitchen albuterol (PROVENTIL) (2.5 MG/3ML) 0.083% nebulizer solution Take 2.5 mg by nebulization every 6 (six) hours as needed.      Marland Kitchen apixaban (ELIQUIS) 2.5 MG TABS tablet Take 1 tablet (2.5 mg total) by mouth 2 (two) times daily.  60 tablet  1  . bisoprolol (ZEBETA) 10 MG tablet Take 1 tablet (10 mg total) by mouth daily.  30 tablet  6  . calcium-vitamin D (OSCAL WITH D) 500-200 MG-UNIT per tablet Take 1 tablet by mouth daily.      . cetirizine (ZYRTEC) 10 MG tablet Take 10 mg by mouth daily.      . Cholecalciferol (VITAMIN D) 2000 UNITS CAPS Take 1 capsule (2,000 Units total) by mouth daily.  30 capsule  5  . Fluticasone-Salmeterol (ADVAIR) 250-50 MCG/DOSE AEPB Inhale 1 puff into the lungs every 12 (twelve) hours.      . furosemide (LASIX) 20 MG tablet Take 40 mg in the am and 20 mg in the pm  90 tablet  1  . hydrOXYzine (ATARAX/VISTARIL) 10 MG tablet Take  1 tablet (10 mg total) by mouth 3 (three) times daily as needed for itching.  30 tablet  0  . ibuprofen (ADVIL,MOTRIN) 800 MG tablet Take 800 mg by mouth every 8 (eight) hours as needed.      . irbesartan (AVAPRO) 300 MG tablet Take 0.5 tablets (150 mg total) by mouth daily.  30 tablet  1  . ketoconazole (NIZORAL) 2 % cream Apply topically daily.  15 g  0  . levothyroxine (LEVOTHROID) 50 MCG tablet Take 1 tablet (50 mcg total) by mouth daily.  90 tablet  1  . metFORMIN (GLUCOPHAGE) 500 MG tablet Take 1 tablet (500 mg total) by mouth 2 (two) times daily with a meal.  60 tablet  2  . montelukast (SINGULAIR) 10 MG tablet Take 1 tablet (10 mg total) by mouth at bedtime.  30 tablet  3  . Multiple Vitamin (MULTIVITAMIN) tablet Take 1 tablet by mouth daily.       . mupirocin ointment (BACTROBAN) 2 % Apply topically 3 (three) times daily.  22 g  0  . NON FORMULARY CPAP w 3L o2 each night      . potassium chloride SA (K-DUR,KLOR-CON) 20 MEQ tablet Take 20 meq in the am and 40 mg in the pm  90 tablet  1  . rosuvastatin (CRESTOR) 20 MG tablet Take 1 tablet (20 mg total) by mouth daily.  30 tablet  6  . tiotropium (SPIRIVA) 18 MCG inhalation capsule Place 1 capsule (18 mcg total) into inhaler and inhale daily.  30 capsule  3   No current facility-administered medications for this visit.    BP 110/70  Pulse 93  Ht _0  (1.651 m)  Wt 277 lb (125.646 kg)  BMI 46.1 kg/m2  SpO2 97% General: NAD, obese.  Neck:JVP 10-12 cm, no thyromegaly or thyroid nodule.  Lungs: Distant breath sounds bilaterally.  CV: Nondisplaced PMI.  Heart irregular S1/S2, no S3/S4, no murmur.  1+ edema to knees bilaterally, L>R.  No carotid bruit.   Abdomen: Soft, nontender, no hepatosplenomegaly, no distention.  Skin: Intact without lesions or rashes.  Neurologic: Alert and oriented x 3.  Psych: Normal affect. Extremities: No clubbing or cyanosis.   Assessment/Plan: 1. Atrial fibrillation: Chronic x years.  She would be unlikely to remain in NSR if cardioversion were attempted.  Plan rate control.  CHADSVASC = 3 at least.  No history of GI bleeding.  She has been started on apixaban 2.5 mg bid (used lower dose given creatinine 1.7).  Reasonable rate control with bisoprolol.  2. Pulmonary HTN/cor pulmonale: Significant right heart failure.  Echo with moderately dilated and dysfunctional RV and PA systolic pressure 65 mmHg.  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  I do not think that there is a role here for pulmonary vasodilators.  I am going to try to gently diurese her to decompress the RV (and simultaneously decrease any compression of the RV on the LV).  - Increase Lasix to 40 qam, 20 qpm - Increase Lasix to 20 qam, 10 qpm.  -  BMET/BNP in 10 days.  - Followup in 1 month.  - V/Q scan to rule out chronic PE as a contributor to pulmonary HTN.  3. CKD: Creatinine 1.7.  May improve if cardiac output improves with diuresis.  As her BP is not elevated, will decrease irbesartan to 150 mg daily.    [name] 11/09/2012

## 2012-11-12 ENCOUNTER — Telehealth: Payer: Self-pay | Admitting: Cardiology

## 2012-11-12 ENCOUNTER — Other Ambulatory Visit: Payer: Self-pay | Admitting: *Deleted

## 2012-11-12 DIAGNOSIS — I2699 Other pulmonary embolism without acute cor pulmonale: Secondary | ICD-10-CM

## 2012-11-12 MED ORDER — POTASSIUM CHLORIDE CRYS ER 20 MEQ PO TBCR: EXTENDED_RELEASE_TABLET | ORAL | Status: DC

## 2012-11-12 NOTE — Telephone Encounter (Signed)
Prescription sent to pharmacy. Pt aware.

## 2012-11-12 NOTE — Telephone Encounter (Signed)
New problem    K+  & potassium was increase. Need a new Rx for [facility]      [facility] [place] [phone]

## 2012-11-15 ENCOUNTER — Telehealth: Payer: Self-pay | Admitting: Emergency Medicine

## 2012-11-15 NOTE — Telephone Encounter (Signed)
Tracey Duncan from [facility] needing sleep study from pt old DME company. We have requested this before when patient had consult and received papers on patient however RB has cleaned out his "look out" and "scan folder" recently so papers may be in process of being scanned. I have called DME -- [facility] x 2  [phone]) and spoke with representative to have them send over sleep study so that patient can do sleep titration/cpap/supplies. First time I spoke with DME i asked them to fax study to [phone] Second time (today) I spoke with Levada Dy and Asked her to fax to [employer] [facility]) fax [phone]  Levada Dy has stated she will send request to their records dept and have this done.

## 2012-11-21 ENCOUNTER — Encounter (HOSPITAL_COMMUNITY)
Admission: RE | Admit: 2012-11-21 | Discharge: 2012-11-21 | Disposition: A | Discharge status: Home / Self Care | Payer: Medicare Other | Source: Ambulatory Visit | Attending: Cardiology | Admitting: Cardiology

## 2012-11-21 ENCOUNTER — Ambulatory Visit (HOSPITAL_COMMUNITY)
Admission: RE | Admit: 2012-11-21 | Discharge: 2012-11-21 | Disposition: A | Discharge status: Home / Self Care | Payer: Medicare Other | Source: Ambulatory Visit | Attending: Cardiology | Admitting: Cardiology

## 2012-11-21 ENCOUNTER — Other Ambulatory Visit (HOSPITAL_COMMUNITY): Payer: Medicare Other

## 2012-11-21 DIAGNOSIS — I2699 Other pulmonary embolism without acute cor pulmonale: Secondary | ICD-10-CM | POA: Insufficient documentation

## 2012-11-21 DIAGNOSIS — Z86711 Personal history of pulmonary embolism: Secondary | ICD-10-CM | POA: Insufficient documentation

## 2012-11-21 DIAGNOSIS — J4489 Other specified chronic obstructive pulmonary disease: Secondary | ICD-10-CM | POA: Insufficient documentation

## 2012-11-21 DIAGNOSIS — I2789 Other specified pulmonary heart diseases: Secondary | ICD-10-CM | POA: Insufficient documentation

## 2012-11-21 DIAGNOSIS — I272 Pulmonary hypertension, unspecified: Secondary | ICD-10-CM

## 2012-11-21 DIAGNOSIS — J449 Chronic obstructive pulmonary disease, unspecified: Secondary | ICD-10-CM | POA: Insufficient documentation

## 2012-11-21 DIAGNOSIS — Z86718 Personal history of other venous thrombosis and embolism: Secondary | ICD-10-CM | POA: Insufficient documentation

## 2012-11-21 MED ORDER — TECHNETIUM TO 99M ALBUMIN AGGREGATED
5.5000 | Freq: Once | INTRAVENOUS | Status: AC | PRN
  Administered 2012-11-21: 6 via INTRAVENOUS

## 2012-11-21 MED ORDER — TECHNETIUM TC 99M DIETHYLENETRIAME-PENTAACETIC ACID
50.5000 | Freq: Once | INTRAVENOUS | Status: AC | PRN
  Administered 2012-11-21: 50.5 via INTRAVENOUS

## 2012-11-23 NOTE — Telephone Encounter (Signed)
error

## 2012-11-26 ENCOUNTER — Telehealth: Payer: Self-pay | Admitting: Emergency Medicine

## 2012-11-26 ENCOUNTER — Encounter: Payer: Self-pay | Admitting: Emergency Medicine

## 2012-11-26 NOTE — Telephone Encounter (Signed)
Sleep study found now scanned into Epic. I have printed out study and given to Alida to get patient started on new cpap w [facility].

## 2012-11-29 ENCOUNTER — Ambulatory Visit (HOSPITAL_COMMUNITY)
Admission: RE | Admit: 2012-11-29 | Discharge: 2012-11-29 | Disposition: A | Discharge status: Home / Self Care | Payer: Medicare Other | Source: Ambulatory Visit | Attending: Internal Medicine | Admitting: Internal Medicine

## 2012-11-29 DIAGNOSIS — Z1239 Encounter for other screening for malignant neoplasm of breast: Secondary | ICD-10-CM

## 2012-11-29 DIAGNOSIS — Z1231 Encounter for screening mammogram for malignant neoplasm of breast: Secondary | ICD-10-CM | POA: Insufficient documentation

## 2012-11-30 ENCOUNTER — Other Ambulatory Visit: Payer: Self-pay | Admitting: Internal Medicine

## 2012-11-30 DIAGNOSIS — R928 Other abnormal and inconclusive findings on diagnostic imaging of breast: Secondary | ICD-10-CM

## 2012-12-01 ENCOUNTER — Other Ambulatory Visit: Payer: Self-pay | Admitting: Cardiology

## 2012-12-04 ENCOUNTER — Telehealth: Payer: Self-pay | Admitting: Emergency Medicine

## 2012-12-04 NOTE — Telephone Encounter (Signed)
I spoke with pt and she is wanting to know if she can go to our lab to have labs drawn that her cardiologists ordered. I advised her it would not be a problem if her cardiologists did not mind. Nothing further was needed

## 2012-12-05 ENCOUNTER — Other Ambulatory Visit: Payer: Medicare Other

## 2012-12-05 ENCOUNTER — Ambulatory Visit (INDEPENDENT_AMBULATORY_CARE_PROVIDER_SITE_OTHER): Payer: Medicare Other | Admitting: Emergency Medicine

## 2012-12-05 ENCOUNTER — Encounter: Payer: Self-pay | Admitting: Emergency Medicine

## 2012-12-05 ENCOUNTER — Other Ambulatory Visit: Payer: Self-pay | Admitting: *Deleted

## 2012-12-05 ENCOUNTER — Other Ambulatory Visit (INDEPENDENT_AMBULATORY_CARE_PROVIDER_SITE_OTHER): Payer: Medicare Other

## 2012-12-05 VITALS — BP 104/68 | HR 103 | Temp 98.0°F | Ht 64.0 in | Wt 259.0 lb

## 2012-12-05 DIAGNOSIS — I4891 Unspecified atrial fibrillation: Secondary | ICD-10-CM

## 2012-12-05 DIAGNOSIS — J449 Chronic obstructive pulmonary disease, unspecified: Secondary | ICD-10-CM

## 2012-12-05 DIAGNOSIS — R0602 Shortness of breath: Secondary | ICD-10-CM

## 2012-12-05 DIAGNOSIS — I2789 Other specified pulmonary heart diseases: Secondary | ICD-10-CM

## 2012-12-05 DIAGNOSIS — I5032 Chronic diastolic (congestive) heart failure: Secondary | ICD-10-CM

## 2012-12-05 DIAGNOSIS — G4733 Obstructive sleep apnea (adult) (pediatric): Secondary | ICD-10-CM

## 2012-12-05 LAB — PULMONARY FUNCTION TEST

## 2012-12-05 LAB — BASIC METABOLIC PANEL
CO2: 28 mEq/L (ref 19–32)
Calcium: 10 mg/dL (ref 8.4–10.5)
GFR: 21.8 mL/min — ABNORMAL LOW (ref >60.00)
Sodium: 138 mEq/L (ref 135–145)

## 2012-12-05 LAB — CBC
Platelets: 163 10*3/uL (ref 150.0–400.0)
RBC: 3.64 Mil/uL — ABNORMAL LOW (ref 3.87–5.11)

## 2012-12-05 LAB — BRAIN NATRIURETIC PEPTIDE: Pro B Natriuretic peptide (BNP): 318 pg/mL — ABNORMAL HIGH (ref 0.0–100.0)

## 2012-12-05 NOTE — Progress Notes (Signed)
PFT done today. 

## 2012-12-05 NOTE — Assessment & Plan Note (Signed)
-  continue the spiriva and advair - SABA prn

## 2012-12-05 NOTE — Progress Notes (Signed)
  Subjective:  Patient ID: Tracey Duncan, female    DOB: Sep 25, 1945, 67 y.o.   MRN: [id number] HPI 67 yo woman, former tobacco (10 pk-yrs), hx of OSA dx in 4/13 (on CPAP + O2), HTN, A fib (failed cardioversion), allergies. COPD dx in 2002, currently on Advair + Spiriva. Albuterol prn, uses it only . Since moving to  in last 6 months she has had more SOB. She is having occasional wheeze, no cough. No CP.  Significant hypoxemia on arrival. Needs a local PCP and cardiologist  ROV 10/30/12 -- COPD, OSA, allergies, A Fib, HTN. She was seen by Dr Aundra Dubin and started anticoagulation for her A fib. Also had TTE that showed PAH with PASP ~ 62mHg. She is wearing her O2 reliably - has a documented desat on O2 at Dr MClaris Gladdenoffice. She is ordered for 6L and 8L with exertion.  Currently on advair, spiriva. Singulair + zyrtec.   ROV 12/05/12 -- COPD, OSA, allergies, A Fib, HTN. Also secondary PAH and severe hypoxemia.  PFT today with moderately severe AFL, no BD response, decreased DLCO. A V/Q on 11/21/12 was normal. Her lasix has been adjusted by Dr MAundra Dubin She is using her oximizer reliably. She is trying to wear CPAP reliably, but still poor sleep - difficulty falling asleep and also staying asleep. Supposed to have ONO on CPAP 4/3.     Objective:   Physical Exam Filed Vitals:   12/05/12 1617  BP: 104/68  Pulse: 103  Temp: 98 F (36.7 C)   Gen: Pleasant, obese, in no distress,  normal affect  ENT: No lesions,  mouth clear,  oropharynx clear, no postnasal drip  Neck: No JVD, no TMG, no carotid bruits  Lungs: No use of accessory muscles, clear without rales or rhonchi  Cardiovascular: RRR, heart sounds normal, no murmur or gallops, 1+ peripheral edema  Musculoskeletal: No deformities, no cyanosis or clubbing  Neuro: alert, non focal  Skin: Warm, no lesions or rashes   10/22/12 --  Study Conclusions  - Left ventricle: The cavity size was normal. Wall thickness was normal. Systolic function was  normal. The estimated ejection fraction was in the range of 55% to 60%. Wall motion was normal; there were no regional wall motion abnormalities. - Left atrium: The atrium was mildly dilated. - Right ventricle: The cavity size was moderately dilated. Systolic function was moderately reduced. - Right atrium: The atrium was moderately dilated. - Pulmonary arteries: Systolic pressure was moderately to severely increased. PA peak pressure: 623mHg (S).      Assessment & Plan:  OSA on CPAP Await overnight oximetry on CPAP. May need to consider referral to Sleep Med to overhaul her CPAP pressures and O2 at night  Secondary PAH Multifactorial - will attempt to optimize all the contributing factors before considering pulm vasodilator therapy   COPD (chronic obstructive pulmonary disease) - continue the spiriva and advair - SABA prn

## 2012-12-05 NOTE — Assessment & Plan Note (Signed)
Multifactorial - will attempt to optimize all the contributing factors before considering pulm vasodilator therapy

## 2012-12-05 NOTE — Assessment & Plan Note (Signed)
Await overnight oximetry on CPAP. May need to consider referral to Sleep Med to overhaul her CPAP pressures and O2 at night

## 2012-12-05 NOTE — Patient Instructions (Addendum)
Please continue your Spiriva and Advair Wear your oxygen as directed Continue your CPAP at night We will review your overnight oximetry on CPAP  Follow with Dr Lamonte Sakai in 6 weeks or sooner if you have any problems

## 2012-12-11 ENCOUNTER — Encounter: Payer: Self-pay | Admitting: Cardiology

## 2012-12-11 ENCOUNTER — Ambulatory Visit (INDEPENDENT_AMBULATORY_CARE_PROVIDER_SITE_OTHER): Payer: Medicare Other | Admitting: Cardiology

## 2012-12-11 VITALS — BP 107/61 | HR 84 | Ht 64.0 in | Wt 253.0 lb

## 2012-12-11 DIAGNOSIS — I5032 Chronic diastolic (congestive) heart failure: Secondary | ICD-10-CM

## 2012-12-11 DIAGNOSIS — I482 Chronic atrial fibrillation, unspecified: Secondary | ICD-10-CM

## 2012-12-11 DIAGNOSIS — E662 Morbid (severe) obesity with alveolar hypoventilation: Secondary | ICD-10-CM

## 2012-12-11 DIAGNOSIS — I4891 Unspecified atrial fibrillation: Secondary | ICD-10-CM

## 2012-12-11 DIAGNOSIS — I509 Heart failure, unspecified: Secondary | ICD-10-CM

## 2012-12-11 NOTE — Patient Instructions (Addendum)
Decrease lasix(furosemide) to 66m daily.   Decrease KCL(potassium) to 20 mEq daily.  Your physician recommends that you return for lab work in: 1 week--BMET/BNP/ANA/Rheumatoid factor.   Your physician recommends that you schedule a follow-up appointment in: 2 months with Dr MAundra Dubin

## 2012-12-11 NOTE — Progress Notes (Signed)
Patient ID: Tracey Duncan, female   DOB: [birth year], 67 y.o.   MRN: [id number] PCP: Baity  67 yo with history of COPD on home oxygen, OSA (? OHS/OSA), and chronic atrial fibrillation presents for cardiology followup.  Patient moved recently from [place] to [place]. She was diagnosed with atrial fibrillation around 2004.  She failed cardioversion and it has been chronic.  She has noted exertional dyspnea since at least 2012.  She was diagnosed with COPD back in 2002.  She no longer smokes.  She had been on CPAP for OSA prior, but was started on home oxygen all the time by Dr. Lamonte Sakai.  Her oxygen saturation is 97% at rest today.  No tachypalpitations, no chest pain.  She reports dyspnea walking up steps or walking about 50 feet on flat ground.  She has chronic lower leg edema. Echo in 2/14 showed a moderately dilated RV with moderately decreased systolic function, PA systolic pressure 65 mmHg. She has started on low-dose apixaban for atrial fibrillation (dosed at 2.5 mg bid due to elevated creatinine).   V/Q scan was negative for chronic PE.  At last appointment, I increased her Lasix to 40 qam, 20 qpm.   Since then, she has lost 24 lbs.  She feels better overall but is still not very active. Her energy level is poor.  Creatinine rose to 2.4 with diuresis from 1.7, so I stopped her Avapro.  Her BP has been well-controlled off Avapro.  Main complaint today is arthritis in her finger joints.   PMH: 1. OSA/OHS: On CPAP at night and oxygen during the day. 2. COPD: Diagnosed 2002.   3. HTN 4. Chronic atrial fibrillation: Failed DCCV in 12/04.   5. 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  Echo (2/14) with EF 55-60%, moderately dilated RV with moderately decreased systolic function, PA systolic pressure 65 mmHg.  V/Q scan in 3/14 was negative for chronic PE.  6. Lexiscan Thallium in 11/11 with no evidence for ischemia or infarction.  7. Impaired fasting glucose. 8.  Hypothyroidism 9. Obesity. 10. CKD  SH: Widow, recently moved to [place] from [place] to live with sister.  Quit smoking in 1993.  Rare ETOH. Retired Optometrist.   FH: Brother with MI at 44, father with MI at 43.  ROS: All systems reviewed and negative except as per HPI.   Current Outpatient Prescriptions  Medication Sig Dispense Refill  . albuterol (PROAIR HFA) 108 (90 BASE) MCG/ACT inhaler Inhale 2 puffs into the lungs every 6 (six) hours as needed.      Marland Kitchen albuterol (PROVENTIL) (2.5 MG/3ML) 0.083% nebulizer solution Take 2.5 mg by nebulization every 6 (six) hours as needed.      . bisoprolol (ZEBETA) 10 MG tablet Take 1 tablet (10 mg total) by mouth daily.  30 tablet  6  . calcium-vitamin D (OSCAL WITH D) 500-200 MG-UNIT per tablet Take 1 tablet by mouth daily.      . cetirizine (ZYRTEC) 10 MG tablet Take 10 mg by mouth daily.      . Cholecalciferol (VITAMIN D) 2000 UNITS CAPS Take 1 capsule (2,000 Units total) by mouth daily.  30 capsule  5  . ELIQUIS 2.5 MG TABS tablet TAKE 1 TABLET (2.5 MG TOTAL) BY MOUTH 2 (TWO) TIMES DAILY.  60 tablet  10  . Fluticasone-Salmeterol (ADVAIR) 250-50 MCG/DOSE AEPB Inhale 1 puff into the lungs every 12 (twelve) hours.      . hydrOXYzine (ATARAX/VISTARIL) 10  MG tablet TAKE 1 TABLET (10 MG TOTAL) BY MOUTH 3 (THREE) TIMES DAILY AS NEEDED FOR ITCHING.  30 tablet  0  . ibuprofen (ADVIL,MOTRIN) 800 MG tablet Take 800 mg by mouth every 8 (eight) hours as needed.      Marland Kitchen ketoconazole (NIZORAL) 2 % cream Apply topically daily.  15 g  0  . levothyroxine (LEVOTHROID) 50 MCG tablet Take 1 tablet (50 mcg total) by mouth daily.  90 tablet  1  . metFORMIN (GLUCOPHAGE) 500 MG tablet Take 1 tablet (500 mg total) by mouth 2 (two) times daily with a meal.  60 tablet  2  . montelukast (SINGULAIR) 10 MG tablet Take 1 tablet (10 mg total) by mouth at bedtime.  30 tablet  3  . Multiple Vitamin (MULTIVITAMIN) tablet Take 1 tablet by mouth daily.      . mupirocin ointment  (BACTROBAN) 2 % Apply topically 3 (three) times daily.  22 g  0  . NON FORMULARY CPAP w 3L o2 each night      . rosuvastatin (CRESTOR) 20 MG tablet Take 1 tablet (20 mg total) by mouth daily.  30 tablet  6  . tiotropium (SPIRIVA) 18 MCG inhalation capsule Place 1 capsule (18 mcg total) into inhaler and inhale daily.  30 capsule  3  . furosemide (LASIX) 40 MG tablet Take 1 tablet (40 mg total) by mouth daily.      . potassium chloride SA (K-DUR,KLOR-CON) 20 MEQ tablet Take 1 tablet (20 mEq total) by mouth daily.       No current facility-administered medications for this visit.    BP 107/61  Pulse 84  Ht _0  (1.626 m)  Wt 253 lb (114.76 kg)  BMI 43.41 kg/m2  SpO2 97% General: NAD, obese.  Neck:JVP 8 cm (improved), no thyromegaly or thyroid nodule.  Lungs: Distant breath sounds bilaterally.  CV: Nondisplaced PMI.  Heart irregular S1/S2, no S3/S4, no murmur.  Trace ankle edema (improved).  No carotid bruit.   Abdomen: Soft, nontender, no hepatosplenomegaly, no distention.  Skin: Intact without lesions or rashes.  Neurologic: Alert and oriented x 3.  Psych: Normal affect. Extremities: No clubbing or cyanosis.   Assessment/Plan: 1. Atrial fibrillation: Chronic x years.  She would be unlikely to remain in NSR if cardioversion were attempted.  Plan rate control.  CHADSVASC = 3 at least.  No history of GI bleeding.  She has been started on apixaban 2.5 mg bid (used lower dose given elevated creatinine).  Reasonable rate control with bisoprolol.  2. Pulmonary HTN/cor pulmonale: Significant right heart failure.  Echo with moderately dilated and dysfunctional RV and PA systolic pressure 65 mmHg.  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  I do not think that there is a role here for pulmonary vasodilators.  She has lost 24 lbs with diuresis since last appointment.  - Given rise in creatinine and improved volume status, will decrease Lasix to 40 qd and will  decrease KCl to 20 daily.   - BMET/BNP in 7 days.  - Followup in 2 months.  3. CKD: Creatinine up to 2.4.  As above, decreasing Lasix.  Already stopped irbesartan.     [name] 12/11/2012

## 2012-12-13 ENCOUNTER — Ambulatory Visit
Admission: RE | Admit: 2012-12-13 | Discharge: 2012-12-13 | Disposition: A | Discharge status: Home / Self Care | Payer: Medicare Other | Source: Ambulatory Visit | Attending: Internal Medicine | Admitting: Internal Medicine

## 2012-12-13 DIAGNOSIS — R928 Other abnormal and inconclusive findings on diagnostic imaging of breast: Secondary | ICD-10-CM

## 2012-12-13 LAB — HM MAMMOGRAPHY: HM MAMMO: NORMAL

## 2012-12-18 ENCOUNTER — Other Ambulatory Visit (INDEPENDENT_AMBULATORY_CARE_PROVIDER_SITE_OTHER): Payer: Medicare Other

## 2012-12-18 DIAGNOSIS — R0609 Other forms of dyspnea: Secondary | ICD-10-CM

## 2012-12-18 DIAGNOSIS — I5032 Chronic diastolic (congestive) heart failure: Secondary | ICD-10-CM

## 2012-12-18 LAB — BRAIN NATRIURETIC PEPTIDE: Pro B Natriuretic peptide (BNP): 294 pg/mL — ABNORMAL HIGH (ref 0.0–100.0)

## 2012-12-18 LAB — BASIC METABOLIC PANEL
BUN: 40 mg/dL — ABNORMAL HIGH (ref 6–23)
Glucose, Bld: 153 mg/dL — ABNORMAL HIGH (ref 70–99)
Sodium: 137 mEq/L (ref 135–145)

## 2012-12-24 ENCOUNTER — Encounter: Payer: Self-pay | Admitting: *Deleted

## 2013-01-16 ENCOUNTER — Ambulatory Visit (INDEPENDENT_AMBULATORY_CARE_PROVIDER_SITE_OTHER): Payer: Medicare Other | Admitting: Emergency Medicine

## 2013-01-16 ENCOUNTER — Encounter: Payer: Self-pay | Admitting: Emergency Medicine

## 2013-01-16 VITALS — BP 120/76 | HR 104 | Ht 64.0 in | Wt 251.2 lb

## 2013-01-16 DIAGNOSIS — M129 Arthropathy, unspecified: Secondary | ICD-10-CM

## 2013-01-16 DIAGNOSIS — I2789 Other specified pulmonary heart diseases: Secondary | ICD-10-CM

## 2013-01-16 DIAGNOSIS — M1990 Unspecified osteoarthritis, unspecified site: Secondary | ICD-10-CM

## 2013-01-16 DIAGNOSIS — J4489 Other specified chronic obstructive pulmonary disease: Secondary | ICD-10-CM

## 2013-01-16 DIAGNOSIS — G4733 Obstructive sleep apnea (adult) (pediatric): Secondary | ICD-10-CM

## 2013-01-16 DIAGNOSIS — Z9989 Dependence on other enabling machines and devices: Secondary | ICD-10-CM

## 2013-01-16 DIAGNOSIS — J449 Chronic obstructive pulmonary disease, unspecified: Secondary | ICD-10-CM

## 2013-01-16 MED ORDER — TRAMADOL HCL 50 MG PO TABS: 50.0000 mg | ORAL_TABLET | Freq: Four times a day (QID) | ORAL | Status: DC | PRN

## 2013-01-16 NOTE — Assessment & Plan Note (Addendum)
Continue albuterol as needed, scheduled advair, spiriva, singulair.

## 2013-01-16 NOTE — Progress Notes (Signed)
Subjective:  Patient ID: Tracey Duncan, female    DOB: 05-21-1946, 67 y.o.   MRN: [id number] HPI 67 yo woman, former tobacco (68 pk-yrs), hx of OSA dx in 4/13 (on CPAP + O2), HTN, A fib (failed cardioversion), allergies. COPD dx in 2002, currently on Advair + Spiriva. Albuterol prn, uses it only . Since moving to [place] in last 6 months she has had more SOB. She is having occasional wheeze, no cough. No CP.  Significant hypoxemia on arrival. Needs a local PCP and cardiologist  ROV 10/30/12 -- COPD, OSA, allergies, A Fib, HTN. She was seen by Dr Aundra Dubin and started anticoagulation for her A fib. Also had TTE that showed PAH with PASP ~ 100mHg. She is wearing her O2 reliably - has a documented desat on O2 at Dr MClaris Gladdenoffice. She is ordered for 6L and 8L with exertion.  Currently on advair, spiriva. Singulair + zyrtec.   ROV 12/05/12 -- COPD, OSA, allergies, A Fib, HTN. Also secondary PAH and severe hypoxemia.  PFT today with moderately severe AFL, no BD response, decreased DLCO. A V/Q on 11/21/12 was normal. Her lasix has been adjusted by Dr MAundra Dubin She is using her oximizer reliably. She is trying to wear CPAP reliably, but still poor sleep - difficulty falling asleep and also staying asleep. Supposed to have ONO on CPAP 4/3.  ROV 01/16/13 --  COPD (FEV1 1.38L), OSA on CPAP, allergies, A Fib, HTN. Also secondary PAH and severe hypoxemia.  Returns for f/u. Reports that her wt has dropped another 8 lbs since our last visit,  breathing well. ONO on CPAP + 3L/min performed > shows 3 discrete episodes of desat that could represent REM-related desats (the report says 2, but it was really 3L/min). Unfortunately about a week ago she developed R UE, hand pain, L knee pain. No f/c, no medication changes or other infectious sx. She follows w [facility].   PULMONARY FUNCTON TEST 12/05/2012  FVC 2.15  FEV1 1.38  FEV1/FVC 64.2  FVC  % Predicted 73  FEV % Predicted 65  FeF 25-75 .65  FeF 25-75 % Predicted 2.44       Objective:   Physical Exam Filed Vitals:   01/16/13 1649  BP: 120/76  Pulse: 104  Height: _0  (1.626 m)  Weight: 251 lb 3.2 oz (113.944 kg)  SpO2: 95%   Gen: Pleasant, obese, in no distress,  normal affect  ENT: No lesions,  mouth clear,  oropharynx clear, no postnasal drip  Neck: No JVD, no TMG, no carotid bruits  Lungs: No use of accessory muscles, clear without rales or rhonchi  Cardiovascular: RRR, heart sounds normal, no murmur or gallops, 1+ peripheral edema  Musculoskeletal: No deformities, no cyanosis or clubbing  Neuro: alert, non focal  Skin: Warm, no lesions or rashes   10/22/12 --  Study Conclusions - Left ventricle: The cavity size was normal. Wall thickness was normal. Systolic function was normal. The estimated ejection fraction was in the range of 55% to 60%. Wall motion was normal; there were no regional wall motion abnormalities. - Left atrium: The atrium was mildly dilated. - Right ventricle: The cavity size was moderately dilated. Systolic function was moderately reduced. - Right atrium: The atrium was moderately dilated. - Pulmonary arteries: Systolic pressure was moderately to severely increased. PA peak pressure: 658mHg (S).      Assessment & Plan:  COPD (chronic obstructive pulmonary disease) Continue albuterol as needed, scheduled advair, spiriva, singulair.    Arthritis  Of the R hand, L knee, also associated with R arm pain. No injury reported. No signs to support an infectious arthritis. Etiology is unclear to me, note a normal RF in the past during her [place] workup. I believe she will need further eval for this, have asked her to see Webb Silversmith to investigate. In the meantime will try to treat w ultram - AVOID NSAIDS given her renal fxn - tylenol and ultram prn - further eval by IM  OSA on CPAP Need to get download info from her CPAP, see if she is having leak or apneic events. She owns the machine (from [place]). Will ask [facility] to do  this and to start maintenance on her machine   Secondary PAH Continue management of her chronic underlying issues, diuretics per Dr Claris Gladden instructions.

## 2013-01-16 NOTE — Assessment & Plan Note (Signed)
Need to get download info from her CPAP, see if she is having leak or apneic events. She owns the machine (from [place]). Will ask [facility] to do this and to start maintenance on her machine

## 2013-01-16 NOTE — Assessment & Plan Note (Signed)
Of the R hand, L knee, also associated with R arm pain. No injury reported. No signs to support an infectious arthritis. Etiology is unclear to me, note a normal RF in the past during her [place] workup. I believe she will need further eval for this, have asked her to see Webb Silversmith to investigate. In the meantime will try to treat w ultram - AVOID NSAIDS given her renal fxn - tylenol and ultram prn - further eval by IM

## 2013-01-16 NOTE — Patient Instructions (Addendum)
Please continue your CPAP and oxygen at 3L/min every night Continue your oxygen at 6-8L/min during the day Continue your inhaled medications as you are taking them Please follow up with Webb Silversmith in Internal Medicine to report on and further investigate your hand and knee pain Take ultram 1-2 tabs up to every 6 hours if needed for pain.  You may also use tylenol, but AVOID ibuprofen Follow with Dr Lamonte Sakai in 3 months or sooner if you have any problems.

## 2013-01-16 NOTE — Assessment & Plan Note (Signed)
Continue management of her chronic underlying issues, diuretics per Dr Claris Gladden instructions.

## 2013-01-17 ENCOUNTER — Other Ambulatory Visit: Payer: Self-pay | Admitting: Emergency Medicine

## 2013-01-17 ENCOUNTER — Telehealth: Payer: Self-pay | Admitting: Emergency Medicine

## 2013-01-17 MED ORDER — ALBUTEROL SULFATE (2.5 MG/3ML) 0.083% IN NEBU: 2.5000 mg | INHALATION_SOLUTION | Freq: Four times a day (QID) | RESPIRATORY_TRACT | Status: DC | PRN

## 2013-01-17 NOTE — Telephone Encounter (Signed)
Per Dr. Lamonte Sakai patient needs to be in to see Webb Silversmith at first available Spoke with Hoyle Sauer-- @ [facility] Patient has been scheduled to see Rollene Fare 01/18/13 @ 330pm Patient aware of appt time/date and nothing further needed at this time

## 2013-01-17 NOTE — Telephone Encounter (Signed)
Rx printed and faxed to [facility] per patient request at last OV 01/16/13

## 2013-01-18 ENCOUNTER — Ambulatory Visit (INDEPENDENT_AMBULATORY_CARE_PROVIDER_SITE_OTHER)
Admission: RE | Admit: 2013-01-18 | Discharge: 2013-01-18 | Disposition: A | Discharge status: Home / Self Care | Payer: Medicare Other | Source: Ambulatory Visit | Attending: Internal Medicine | Admitting: Internal Medicine

## 2013-01-18 ENCOUNTER — Ambulatory Visit (INDEPENDENT_AMBULATORY_CARE_PROVIDER_SITE_OTHER): Payer: Medicare Other | Admitting: Internal Medicine

## 2013-01-18 ENCOUNTER — Encounter: Payer: Self-pay | Admitting: Internal Medicine

## 2013-01-18 VITALS — BP 122/68 | HR 85 | Temp 98.4°F

## 2013-01-18 DIAGNOSIS — S76112D Strain of left quadriceps muscle, fascia and tendon, subsequent encounter: Secondary | ICD-10-CM

## 2013-01-18 DIAGNOSIS — M79609 Pain in unspecified limb: Secondary | ICD-10-CM

## 2013-01-18 DIAGNOSIS — M25539 Pain in unspecified wrist: Secondary | ICD-10-CM

## 2013-01-18 DIAGNOSIS — Z5189 Encounter for other specified aftercare: Secondary | ICD-10-CM

## 2013-01-18 DIAGNOSIS — M79641 Pain in right hand: Secondary | ICD-10-CM

## 2013-01-18 DIAGNOSIS — M25531 Pain in right wrist: Secondary | ICD-10-CM

## 2013-01-18 NOTE — Progress Notes (Signed)
Subjective:    Patient ID: Tracey Duncan, female    DOB: [birth year], 67 y.o.   MRN: [id number]  HPI  Pt presents to the clinic today with c/o joint pain in her right hand and right wrist. This started about 1 month ago. She is taking Ibuprofen 800 mg prn and tramadol for pain. She has never had a history of arthritis that she know of. She has been checked for rheumatoid arthritis which has been negative. She wants to know if that is for sure causing her pain or not. Additionally she c/o pain behind her left knee. It is worse when she bends her knees. The ibuprofen and tramadol do help. She has had this pain in the past and stretching seemed to help it.  Review of Systems      Past Medical History  Diagnosis Date  . Hypertension   . Atrial fibrillation     failed cardioversion  . Sinus trouble   . OSA on CPAP   . Asthma   . Hyperlipidemia   . COPD (chronic obstructive pulmonary disease)   . Thyroid disease     Current Outpatient Prescriptions  Medication Sig Dispense Refill  . albuterol (PROAIR HFA) 108 (90 BASE) MCG/ACT inhaler Inhale 2 puffs into the lungs every 6 (six) hours as needed.      Marland Kitchen albuterol (PROVENTIL) (2.5 MG/3ML) 0.083% nebulizer solution Take 3 mLs (2.5 mg total) by nebulization every 6 (six) hours as needed.  360 mL  11  . bisoprolol (ZEBETA) 10 MG tablet Take 1 tablet (10 mg total) by mouth daily.  30 tablet  6  . calcium-vitamin D (OSCAL WITH D) 500-200 MG-UNIT per tablet Take 1 tablet by mouth daily.      . cetirizine (ZYRTEC) 10 MG tablet Take 10 mg by mouth daily.      . Cholecalciferol (VITAMIN D) 2000 UNITS CAPS Take 1 capsule (2,000 Units total) by mouth daily.  30 capsule  5  . ELIQUIS 2.5 MG TABS tablet TAKE 1 TABLET (2.5 MG TOTAL) BY MOUTH 2 (TWO) TIMES DAILY.  60 tablet  10  . Fluticasone-Salmeterol (ADVAIR) 250-50 MCG/DOSE AEPB Inhale 1 puff into the lungs every 12 (twelve) hours.      . furosemide (LASIX) 40 MG tablet Take 1 tablet (40 mg total) by  mouth daily.      Marland Kitchen ibuprofen (ADVIL,MOTRIN) 800 MG tablet Take 800 mg by mouth every 8 (eight) hours as needed.      Marland Kitchen levothyroxine (LEVOTHROID) 50 MCG tablet Take 1 tablet (50 mcg total) by mouth daily.  90 tablet  1  . metFORMIN (GLUCOPHAGE) 500 MG tablet Take 1 tablet (500 mg total) by mouth 2 (two) times daily with a meal.  60 tablet  2  . montelukast (SINGULAIR) 10 MG tablet Take 1 tablet (10 mg total) by mouth at bedtime.  30 tablet  3  . Multiple Vitamin (MULTIVITAMIN) tablet Take 1 tablet by mouth daily.      . NON FORMULARY CPAP w 3L o2 each night      . potassium chloride SA (K-DUR,KLOR-CON) 20 MEQ tablet Take 1 tablet (20 mEq total) by mouth daily.      . rosuvastatin (CRESTOR) 20 MG tablet Take 1 tablet (20 mg total) by mouth daily.  30 tablet  6  . tiotropium (SPIRIVA) 18 MCG inhalation capsule Place 1 capsule (18 mcg total) into inhaler and inhale daily.  30 capsule  3  . traMADol (ULTRAM) 50 MG tablet  Take 1-2 tablets (50-100 mg total) by mouth every 6 (six) hours as needed for pain.  60 tablet  0   No current facility-administered medications for this visit.    No Known Allergies  Family History  Problem Relation Age of Onset  . Emphysema Mother   . Allergies Mother   . Arthritis Mother   . Heart disease Father   . Heart disease Brother   . Tongue cancer Paternal Grandmother   . Cancer Cousin     Ovarian Cancer  . Cancer Cousin     Breast Cancer    History   Social History  . Marital Status: Widowed    Spouse Name: N/A    Number of Children: 0  . Years of Education: N/A   Occupational History  . retired     Optometrist   Social History Main Topics  . Smoking status: Former Smoker -- 1.50 packs/day for 28 years    Types: Cigarettes    Quit date: 09/06/1991  . Smokeless tobacco: Never Used  . Alcohol Use: No     Comment: "very seldom" 09/26/12  . Drug Use: No  . Sexually Active: Not on file   Other Topics Concern  . Not on file   Social History  Narrative   Regular exercise-no   Caffeine Use-yes           Constitutional: Denies fever, malaise, fatigue, headache or abrupt weight changes. . Musculoskeletal: Pt reports joint pain and pain behind left knee.Denies decrease in range of motion, difficulty with gait, muscle pain or joint pain and swelling.    No other specific complaints in a complete review of systems (except as listed in HPI above).  Objective:   Physical Exam   BP 122/68  Pulse 85  Temp(Src) 98.4 F (36.9 C) (Oral)  SpO2 92% Wt Readings from Last 3 Encounters:  01/16/13 251 lb 3.2 oz (113.944 kg)  12/11/12 253 lb (114.76 kg)  12/05/12 259 lb (117.482 kg)    General: Appears her stated age, obese but well developed, well nourished in NAD.Marland Kitchen  Cardiovascular: Normal rate and rhythm. S1,S2 noted.  No murmur, rubs or gallops noted. No JVD or BLE edema. No carotid bruits noted. Pulmonary/Chest: Normal effort and positive vesicular breath sounds. No respiratory distress. No wheezes, rales or ronchi noted. . Musculoskeletal: Arthritis noted of right hand. Decreased flexion of fingers of right hand. .        Assessment & Plan:   Pain in right hand and right wrist, new onset:  Likely due to osteoarthritis Continue ibuprofen and tramadol Will obtain xray of right hand and wrist  Tendonitis of left quad, recurrent:  Continue ibuprofen and tramadol Perform stretching exercises as indicated on handout  RTC as needed or if pain persist

## 2013-01-21 ENCOUNTER — Telehealth: Payer: Self-pay | Admitting: Internal Medicine

## 2013-01-21 NOTE — Telephone Encounter (Signed)
Patient is requesting a call back with the results of her x-ray

## 2013-01-21 NOTE — Telephone Encounter (Signed)
Pt informed results released to MyChart also informed of results and NP's advisement per MyChart message.

## 2013-01-26 ENCOUNTER — Other Ambulatory Visit: Payer: Self-pay | Admitting: Internal Medicine

## 2013-01-30 ENCOUNTER — Other Ambulatory Visit: Payer: Self-pay | Admitting: Internal Medicine

## 2013-01-30 ENCOUNTER — Other Ambulatory Visit: Payer: Self-pay | Admitting: Cardiology

## 2013-02-19 ENCOUNTER — Encounter: Payer: Self-pay | Admitting: Cardiology

## 2013-02-19 ENCOUNTER — Ambulatory Visit (INDEPENDENT_AMBULATORY_CARE_PROVIDER_SITE_OTHER): Payer: Medicare Other | Admitting: Cardiology

## 2013-02-19 VITALS — BP 110/58 | HR 86 | Ht 64.0 in | Wt 234.0 lb

## 2013-02-19 DIAGNOSIS — M129 Arthropathy, unspecified: Secondary | ICD-10-CM

## 2013-02-19 DIAGNOSIS — M1990 Unspecified osteoarthritis, unspecified site: Secondary | ICD-10-CM

## 2013-02-19 DIAGNOSIS — E662 Morbid (severe) obesity with alveolar hypoventilation: Secondary | ICD-10-CM

## 2013-02-19 DIAGNOSIS — I2781 Cor pulmonale (chronic): Secondary | ICD-10-CM

## 2013-02-19 DIAGNOSIS — I482 Chronic atrial fibrillation, unspecified: Secondary | ICD-10-CM

## 2013-02-19 DIAGNOSIS — M2550 Pain in unspecified joint: Secondary | ICD-10-CM

## 2013-02-19 DIAGNOSIS — I279 Pulmonary heart disease, unspecified: Secondary | ICD-10-CM

## 2013-02-19 DIAGNOSIS — I4891 Unspecified atrial fibrillation: Secondary | ICD-10-CM

## 2013-02-19 DIAGNOSIS — G4733 Obstructive sleep apnea (adult) (pediatric): Secondary | ICD-10-CM

## 2013-02-19 DIAGNOSIS — Z9989 Dependence on other enabling machines and devices: Secondary | ICD-10-CM

## 2013-02-19 DIAGNOSIS — I5032 Chronic diastolic (congestive) heart failure: Secondary | ICD-10-CM

## 2013-02-19 MED ORDER — TRAMADOL HCL 50 MG PO TABS: 50.0000 mg | ORAL_TABLET | Freq: Two times a day (BID) | ORAL | Status: DC | PRN

## 2013-02-19 MED ORDER — APIXABAN 5 MG PO TABS: 5.0000 mg | ORAL_TABLET | Freq: Two times a day (BID) | ORAL | Status: DC

## 2013-02-19 NOTE — Patient Instructions (Addendum)
Increase Eliquis(apixiban) to 101m two times a day.  Your physician recommends that you have  lab work today--CRP/Sed Rate  You have been referred to a rheumatologist for joint pain--Dr BAmil Amenor Dr DEstanislado Pandy Your physician recommends that you schedule a follow-up appointment in: 3 months with Dr MAundra Dubin

## 2013-02-20 LAB — SEDIMENTATION RATE: Sed Rate: 70 mm/hr — ABNORMAL HIGH (ref 0–22)

## 2013-02-21 ENCOUNTER — Telehealth: Payer: Self-pay | Admitting: Cardiology

## 2013-02-21 NOTE — Telephone Encounter (Signed)
Will forward to [facility].

## 2013-02-21 NOTE — Progress Notes (Signed)
Patient ID: Tracey Duncan, female   DOB: 07-06-1946, 67 y.o.   MRN: [id number] PCP: Baity  67 yo with history of COPD on home oxygen, OSA (? OHS/OSA), and chronic atrial fibrillation presents for cardiology followup.  Patient moved recently from [place] to [place]. She was diagnosed with atrial fibrillation around 2004.  She failed cardioversion and it has been chronic.  She has noted exertional dyspnea since at least 2012.  She was diagnosed with COPD back in 2002.  She no longer smokes.  She had been on CPAP for OSA prior, but was started on home oxygen all the time by Dr. Lamonte Sakai.  Her oxygen saturation is 97% at rest today.  No tachypalpitations, no chest pain.  She reports dyspnea walking up steps or walking about 50 feet on flat ground.  She has chronic lower leg edema. Echo in 2/14 showed a moderately dilated RV with moderately decreased systolic function, PA systolic pressure 65 mmHg. She was started on low-dose apixaban for atrial fibrillation (dosed at 2.5 mg bid due to elevated creatinine).   V/Q scan was negative for chronic PE.  Since last appointment, Tracey Duncan has lost another 19 lbs.  She is overall breathing better.  Still wearing her home oxygen (oxygen saturation in the mid to upper 90s at home).  She remains in atrial fibrillation with good rate control.  Main complaint today is joint pain.  She still has significant pain in her left knee and in her right wrist, MCP joints, and PIP joints.  This has been going on for a couple of months to this degree.   Labs (4/14): RF < 10, ANA negative, BNP 294, K 3.8, creatinine 1.7   PMH: 1. OSA/OHS: On CPAP at night and oxygen during the day. 2. COPD: Diagnosed 2002.   3. HTN 4. Chronic atrial fibrillation: Failed DCCV in 12/04.   5. 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  Echo (2/14) with EF 55-60%, moderately dilated RV with moderately decreased systolic function, PA systolic pressure 65 mmHg.  V/Q  scan in 3/14 was negative for chronic PE.  6. Lexiscan Thallium in 11/11 with no evidence for ischemia or infarction.  7. Impaired fasting glucose. 8. Hypothyroidism 9. Obesity. 10. CKD 11. Arthritis  SH: Widow, recently moved to [place] from [place] to live with sister.  Quit smoking in 1993.  Rare ETOH. Retired Optometrist.   FH: Brother with MI at 12, father with MI at 95.  ROS: All systems reviewed and negative except as per HPI.   Current Outpatient Prescriptions  Medication Sig Dispense Refill  . albuterol (PROAIR HFA) 108 (90 BASE) MCG/ACT inhaler Inhale 2 puffs into the lungs every 6 (six) hours as needed.      Marland Kitchen albuterol (PROVENTIL) (2.5 MG/3ML) 0.083% nebulizer solution Take 3 mLs (2.5 mg total) by nebulization every 6 (six) hours as needed.  360 mL  11  . apixaban (ELIQUIS) 5 MG TABS tablet Take 1 tablet (5 mg total) by mouth 2 (two) times daily.  60 tablet  4  . bisoprolol (ZEBETA) 10 MG tablet Take 1 tablet (10 mg total) by mouth daily.  30 tablet  6  . calcium-vitamin D (OSCAL WITH D) 500-200 MG-UNIT per tablet Take 1 tablet by mouth daily.      . cetirizine (ZYRTEC) 10 MG tablet Take 10 mg by mouth daily.      . Cholecalciferol (VITAMIN D) 2000 UNITS CAPS Take 1 capsule (  2,000 Units total) by mouth daily.  30 capsule  5  . Fluticasone-Salmeterol (ADVAIR) 250-50 MCG/DOSE AEPB Inhale 1 puff into the lungs every 12 (twelve) hours.      . furosemide (LASIX) 40 MG tablet Take 1 tablet (40 mg total) by mouth daily.      . furosemide (LASIX) 40 MG tablet Take 40 mg once daily  90 tablet  1  . ibuprofen (ADVIL,MOTRIN) 800 MG tablet Take 800 mg by mouth every 8 (eight) hours as needed.      Marland Kitchen levothyroxine (LEVOTHROID) 50 MCG tablet Take 1 tablet (50 mcg total) by mouth daily.  90 tablet  1  . metFORMIN (GLUCOPHAGE) 500 MG tablet TAKE 1 TABLET (500 MG TOTAL) BY MOUTH 2 (TWO) TIMES DAILY WITH A MEAL.  60 tablet  2  . metFORMIN (GLUCOPHAGE) 500 MG tablet TAKE 1 TABLET (500 MG TOTAL)  BY MOUTH 2 (TWO) TIMES DAILY WITH A MEAL.  60 tablet  2  . montelukast (SINGULAIR) 10 MG tablet Take 1 tablet (10 mg total) by mouth at bedtime.  30 tablet  3  . Multiple Vitamin (MULTIVITAMIN) tablet Take 1 tablet by mouth daily.      . NON FORMULARY CPAP w 3L o2 each night      . potassium chloride SA (K-DUR,KLOR-CON) 20 MEQ tablet Take 1 tablet (20 mEq total) by mouth daily.      . rosuvastatin (CRESTOR) 20 MG tablet Take 1 tablet (20 mg total) by mouth daily.  30 tablet  6  . tiotropium (SPIRIVA) 18 MCG inhalation capsule Place 1 capsule (18 mcg total) into inhaler and inhale daily.  30 capsule  3  . traMADol (ULTRAM) 50 MG tablet Take 1 tablet (50 mg total) by mouth 2 (two) times daily as needed for pain.  30 tablet  0   No current facility-administered medications for this visit.    BP 110/58  Pulse 86  Ht _0  (1.626 m)  Wt 234 lb (106.142 kg)  BMI 40.15 kg/m2 General: NAD, obese.  Neck:JVP 7 cm (improved), no thyromegaly or thyroid nodule.  Lungs: Distant breath sounds bilaterally.  CV: Nondisplaced PMI.  Heart irregular S1/S2, no S3/S4, no murmur.  Trace ankle edema (improved).  No carotid bruit.   Abdomen: Soft, nontender, no hepatosplenomegaly, no distention.  Skin: Intact without lesions or rashes.  Neurologic: Alert and oriented x 3.  Psych: Normal affect. Extremities: No clubbing or cyanosis.   Assessment/Plan: 1. Atrial fibrillation: Chronic x years.  She would be unlikely to remain in NSR if cardioversion were attempted.  Plan rate control.  CHADSVASC = 3 at least.  No history of GI bleeding.  She can increase her apixaban to 5 mg bid today.  Reasonable rate control with bisoprolol.  2. Pulmonary HTN/cor pulmonale: Significant right heart failure.  Echo with moderately dilated and dysfunctional RV and PA systolic pressure 65 mmHg.  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  I do not think that there is a role here for pulmonary  vasodilators.  She has lost 19 lbs with diuresis since last appointment.  - Continue current Lasix and K.    - BMET today  3. CKD: Creatinine down to 1.7 when last checked.  4. Arthritis: Negative ANA and RF.  May be osteoarthritis but given her pulmonary HTN think that we should formally rule out collagen vascular disease as this could be playing a role in the pulmonary HTN.  Will check ESR and CRP.  I will refer her to a rheumatologist.   [name] 02/21/2013

## 2013-02-21 NOTE — Telephone Encounter (Signed)
02/21/13 Spoke with Tracey Duncan today about her appointment to see the Rheumatology,she is aware that it could take a few weeks and that I will keep her up dated.

## 2013-02-21 NOTE — Telephone Encounter (Signed)
New Problem  Pt said she was supposed to get a call back from [place] for an appt for a rheumatologist. She said when she left her appt the other day, the office was closed so we could not do the referral. She asked is someone could call her back regarding this appt.

## 2013-03-06 ENCOUNTER — Telehealth: Payer: Self-pay | Admitting: *Deleted

## 2013-03-06 NOTE — Telephone Encounter (Signed)
03/06/13 Webb Silversmith, I spoke with his patient on yesterday about her appointment she told me that Dr.Beckman's office call her with an appointment as well,she will call her Insurance company and see witch one is in net work and keep that appt. And cancel the other one herself. Dr.Deveshwar is on 7/28 and Dr.Beckmans on 04/02/13.

## 2013-03-11 ENCOUNTER — Other Ambulatory Visit: Payer: Self-pay | Admitting: Cardiology

## 2013-03-11 ENCOUNTER — Other Ambulatory Visit: Payer: Self-pay | Admitting: Emergency Medicine

## 2013-03-31 ENCOUNTER — Other Ambulatory Visit: Payer: Self-pay | Admitting: Emergency Medicine

## 2013-04-08 ENCOUNTER — Telehealth: Payer: Self-pay | Admitting: Emergency Medicine

## 2013-04-08 MED ORDER — FLUTICASONE-SALMETEROL 250-50 MCG/DOSE IN AEPB: 1.0000 | INHALATION_SPRAY | Freq: Two times a day (BID) | RESPIRATORY_TRACT | Status: DC

## 2013-04-08 NOTE — Telephone Encounter (Signed)
Pt aware RX for advair had been sent. Nothing further was needed

## 2013-04-10 ENCOUNTER — Other Ambulatory Visit: Payer: Self-pay

## 2013-04-29 ENCOUNTER — Other Ambulatory Visit: Payer: Self-pay | Admitting: Internal Medicine

## 2013-04-29 ENCOUNTER — Other Ambulatory Visit: Payer: Self-pay | Admitting: Cardiology

## 2013-04-29 ENCOUNTER — Other Ambulatory Visit: Payer: Self-pay | Admitting: Emergency Medicine

## 2013-05-07 ENCOUNTER — Other Ambulatory Visit: Payer: Self-pay | Admitting: *Deleted

## 2013-05-07 MED ORDER — ROSUVASTATIN CALCIUM 20 MG PO TABS: 20.0000 mg | ORAL_TABLET | Freq: Every day | ORAL | Status: DC

## 2013-05-22 ENCOUNTER — Ambulatory Visit: Payer: Medicare Other | Admitting: Emergency Medicine

## 2013-05-27 ENCOUNTER — Telehealth: Payer: Self-pay | Admitting: Cardiology

## 2013-05-27 NOTE — Telephone Encounter (Signed)
Pt has appt Thursday with dentist, has tooth ached and wants to know if she needs abx prior?

## 2013-05-27 NOTE — Telephone Encounter (Signed)
Per AHA recommendations pt does not need antibiotics. Pt states appt Thurs is for check up since she is having a toothache and no procedures planned at present. I advised pt to make dentist aware she was taking Eliquis. I will forward to Dr Aundra Dubin for review.

## 2013-05-29 ENCOUNTER — Ambulatory Visit (INDEPENDENT_AMBULATORY_CARE_PROVIDER_SITE_OTHER): Payer: Medicare Other | Admitting: Emergency Medicine

## 2013-05-29 ENCOUNTER — Encounter: Payer: Self-pay | Admitting: Emergency Medicine

## 2013-05-29 ENCOUNTER — Encounter: Payer: Self-pay | Admitting: Cardiology

## 2013-05-29 ENCOUNTER — Encounter: Payer: Self-pay | Admitting: *Deleted

## 2013-05-29 VITALS — BP 110/80 | HR 101 | Temp 98.4°F | Ht 64.0 in | Wt 225.2 lb

## 2013-05-29 DIAGNOSIS — J449 Chronic obstructive pulmonary disease, unspecified: Secondary | ICD-10-CM

## 2013-05-29 DIAGNOSIS — I2789 Other specified pulmonary heart diseases: Secondary | ICD-10-CM

## 2013-05-29 DIAGNOSIS — Z23 Encounter for immunization: Secondary | ICD-10-CM

## 2013-05-29 DIAGNOSIS — R059 Cough, unspecified: Secondary | ICD-10-CM

## 2013-05-29 DIAGNOSIS — R05 Cough: Secondary | ICD-10-CM

## 2013-05-29 DIAGNOSIS — G4733 Obstructive sleep apnea (adult) (pediatric): Secondary | ICD-10-CM

## 2013-05-29 NOTE — Assessment & Plan Note (Addendum)
-  add nasonex to her singulair and zyrtec. If she benefits then will order fluticasone

## 2013-05-29 NOTE — Patient Instructions (Addendum)
Please continue your CPAP every night Please continue your inhaled medications as you are taking them Wear your oxygen at all times Try using nasonex 2 sprays each nostril daily to see if this helps your cough and congestion. If you benefit call our office and we will order a generic version from your pharmacy.  Follow with Dr Lamonte Sakai in 4 months or sooner if you have any problems.

## 2013-05-29 NOTE — Progress Notes (Signed)
Subjective:  Patient ID: Tracey Duncan, female    DOB: 1946/08/15, 67 y.o.   MRN: [id number] HPI 67 yo woman, former tobacco (42 pk-yrs), hx of OSA dx in 4/13 (on CPAP + O2), HTN, A fib (failed cardioversion), allergies. COPD dx in 2002, currently on Advair + Spiriva. Albuterol prn, uses it only . Since moving to [place] in last 6 months she has had more SOB. She is having occasional wheeze, no cough. No CP.  Significant hypoxemia on arrival. Needs a local PCP and cardiologist  ROV 10/30/12 -- COPD, OSA, allergies, A Fib, HTN. She was seen by Dr Aundra Dubin and started anticoagulation for her A fib. Also had TTE that showed PAH with PASP ~ 70mHg. She is wearing her O2 reliably - has a documented desat on O2 at Dr MClaris Gladdenoffice. She is ordered for 6L and 8L with exertion.  Currently on advair, spiriva. Singulair + zyrtec.   ROV 12/05/12 -- COPD, OSA, allergies, A Fib, HTN. Also secondary PAH and severe hypoxemia.  PFT today with moderately severe AFL, no BD response, decreased DLCO. A V/Q on 11/21/12 was normal. Her lasix has been adjusted by Dr MAundra Dubin She is using her oximizer reliably. She is trying to wear CPAP reliably, but still poor sleep - difficulty falling asleep and also staying asleep. Supposed to have ONO on CPAP 4/3.  ROV 01/16/13 --  COPD (FEV1 1.38L), OSA on CPAP, allergies, A Fib, HTN. Also secondary PAH and severe hypoxemia.  Returns for f/u. Reports that her wt has dropped another 8 lbs since our last visit,  breathing well. ONO on CPAP + 3L/min performed > shows 3 discrete episodes of desat that could represent REM-related desats (the report says 2, but it was really 3L/min). Unfortunately about a week ago she developed R UE, hand pain, L knee pain. No f/c, no medication changes or other infectious sx. She follows w [facility].   PULMONARY FUNCTON TEST 12/05/2012  FVC 2.15  FEV1 1.38  FEV1/FVC 64.2  FVC  % Predicted 73  FEV % Predicted 65  FeF 25-75 .65  FeF 25-75 % Predicted 2.44       05/29/13 -- COPD, OSA, newly dx RA. Secondary PAH and hypoxemia. She was started on prednisone, now weaning and being converted to [place] She feels MUCH better, is more active. She is compliant with her oximizer. She wears her CPAP. Compliance data shows that she wears it, she states that it helps her clinically.  She is having more nasal drainage, cough with clear sputum.   Objective:   Physical Exam Filed Vitals:   05/29/13 1625  BP: 110/80  Pulse: 101  Temp: 98.4 F (36.9 C)  TempSrc: Oral  Height: _0  (1.626 m)  Weight: 225 lb 3.2 oz (102.15 kg)  SpO2: 95%   Gen: Pleasant, obese, in no distress,  normal affect  ENT: No lesions,  mouth clear,  oropharynx clear, no postnasal drip  Neck: No JVD, no TMG, no carotid bruits  Lungs: No use of accessory muscles, clear without rales or rhonchi  Cardiovascular: RRR, heart sounds normal, no murmur or gallops, 1+ peripheral edema  Musculoskeletal: No deformities, no cyanosis or clubbing  Neuro: alert, non focal  Skin: Warm, no lesions or rashes   10/22/12 --  Study Conclusions - Left ventricle: The cavity size was normal. Wall thickness was normal. Systolic function was normal. The estimated ejection fraction was in the range of 55% to 60%. Wall motion was normal; there were no  regional wall motion abnormalities. - Left atrium: The atrium was mildly dilated. - Right ventricle: The cavity size was moderately dilated. Systolic function was moderately reduced. - Right atrium: The atrium was moderately dilated. - Pulmonary arteries: Systolic pressure was moderately to severely increased. PA peak pressure: 44m Hg (S).      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 Interestingly she now carries dx of RA. Her hypoxemia is more than I would expect due to her chronic conditions. It is possible that she might benefit from targeted therapy to treat PAH at some point.   OSA on  CPAP Download data confirms good compliance, 6cm H2O.   COPD (chronic obstructive pulmonary disease) - continue current BDs and O2  Cough - add nasonex to her singulair and zyrtec. If she benefits then will order fluticasone

## 2013-05-29 NOTE — Assessment & Plan Note (Signed)
Download data confirms good compliance, 6cm H2O.

## 2013-05-29 NOTE — Assessment & Plan Note (Signed)
-  continue current BDs and O2

## 2013-05-29 NOTE — Assessment & Plan Note (Signed)
I have ascribed to A Fib, L sided pressures, OSA, COPD. Interestingly she now carries dx of RA. Her hypoxemia is more than I would expect due to her chronic conditions. It is possible that she might benefit from targeted therapy to treat PAH at some point.

## 2013-06-26 ENCOUNTER — Ambulatory Visit (INDEPENDENT_AMBULATORY_CARE_PROVIDER_SITE_OTHER): Payer: Medicare Other | Admitting: Cardiology

## 2013-06-26 ENCOUNTER — Encounter: Payer: Self-pay | Admitting: Cardiology

## 2013-06-26 ENCOUNTER — Encounter: Payer: Self-pay | Admitting: *Deleted

## 2013-06-26 VITALS — BP 126/80 | HR 83 | Ht 64.0 in | Wt 230.8 lb

## 2013-06-26 DIAGNOSIS — I2789 Other specified pulmonary heart diseases: Secondary | ICD-10-CM

## 2013-06-26 DIAGNOSIS — J449 Chronic obstructive pulmonary disease, unspecified: Secondary | ICD-10-CM

## 2013-06-26 DIAGNOSIS — I4891 Unspecified atrial fibrillation: Secondary | ICD-10-CM

## 2013-06-26 DIAGNOSIS — E662 Morbid (severe) obesity with alveolar hypoventilation: Secondary | ICD-10-CM

## 2013-06-26 DIAGNOSIS — I5032 Chronic diastolic (congestive) heart failure: Secondary | ICD-10-CM

## 2013-06-26 DIAGNOSIS — I482 Chronic atrial fibrillation, unspecified: Secondary | ICD-10-CM

## 2013-06-26 DIAGNOSIS — I509 Heart failure, unspecified: Secondary | ICD-10-CM

## 2013-06-26 MED ORDER — FUROSEMIDE 40 MG PO TABS: ORAL_TABLET | ORAL | Status: DC

## 2013-06-26 NOTE — Progress Notes (Signed)
Patient ID: MARKI FREDE, female   DOB: [birth year], 67 y.o.   MRN: [id number] PCP: Baity  67 yo with history of COPD on home oxygen, OSA (? OHS/OSA), rheumatoid arthritis, and chronic atrial fibrillation presents for cardiology followup.  She was diagnosed with atrial fibrillation around 2004.  She failed cardioversion and it has been chronic.  She has noted exertional dyspnea since at least 2012.  She was diagnosed with COPD back in 2002.  She no longer smokes.  She had been on CPAP for OSA prior, but was started on home oxygen all the time by Dr. Lamonte Sakai.  No tachypalpitations, no chest pain.  Echo in 2/14 showed a moderately dilated RV with moderately decreased systolic function, PA systolic pressure 65 mmHg. She was started on apixaban.   V/Q scan was negative for chronic PE.  She has been diagnosed with rheumatoid arthritis since I last saw her and is on Arava and prednisone.  She is having less joint pain.    Since last appointment, Mrs Gamm has lost another 4 lbs.  However, for the last 2 weeks (since vacation to the beach), she has been breathing worse.  She has also re-developed ankle edema.  She is short of breath walking short distances such as around her house.  Stable orthopnea.  No chest pain, no lightheadedness.   Labs (4/14): RF < 10, ANA negative, BNP 294, K 3.8, creatinine 1.7  Labs (9/14): K 4.5, creatinine 1.74  PMH: 1. OSA/OHS: On CPAP at night and oxygen during the day. 2. COPD: Diagnosed 2002.  FEV1 65% predicted in 4/14.  3. HTN 4. Chronic atrial fibrillation: Failed DCCV in 12/04.   5. 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  Echo (2/14) with EF 55-60%, moderately dilated RV with moderately decreased systolic function, PA systolic pressure 65 mmHg.  V/Q scan in 3/14 was negative for chronic PE.  6. Lexiscan Thallium in 11/11 with no evidence for ischemia or infarction.  7. Impaired fasting glucose. 8. Hypothyroidism 9.  Obesity. 10. CKD 11. Arthritis  SH: Widow, moved to [place] from [place] to live with sister.  Quit smoking in 1993.  Rare ETOH. Retired Optometrist.   FH: Brother with MI at 37, father with MI at 44.  ROS: All systems reviewed and negative except as per HPI.   Current Outpatient Prescriptions  Medication Sig Dispense Refill  . albuterol (PROAIR HFA) 108 (90 BASE) MCG/ACT inhaler Inhale 2 puffs into the lungs every 6 (six) hours as needed.      Marland Kitchen albuterol (PROVENTIL) (2.5 MG/3ML) 0.083% nebulizer solution Take 3 mLs (2.5 mg total) by nebulization every 6 (six) hours as needed.  360 mL  11  . apixaban (ELIQUIS) 5 MG TABS tablet Take 1 tablet (5 mg total) by mouth 2 (two) times daily.  60 tablet  4  . bisoprolol (ZEBETA) 10 MG tablet TAKE 1 TABLET (10 MG TOTAL) BY MOUTH DAILY.  30 tablet  6  . calcium-vitamin D (OSCAL WITH D) 500-200 MG-UNIT per tablet Take 1 tablet by mouth daily.      . cetirizine (ZYRTEC) 10 MG tablet Take 10 mg by mouth daily.      . [employer] VITAMIN D 2000 UNITS CAPS TAKE ONE CAPSULE EVERY DAY  30 capsule  5  . Fluticasone-Salmeterol (ADVAIR) 250-50 MCG/DOSE AEPB Inhale 1 puff into the lungs every 12 (twelve) hours.  60 each  5  . leflunomide (ARAVA) 10 MG tablet  Take 10 mg by mouth daily.      Marland Kitchen levothyroxine (SYNTHROID, LEVOTHROID) 50 MCG tablet TAKE 1 TABLET (50 MCG TOTAL) BY MOUTH DAILY.  90 tablet  1  . metFORMIN (GLUCOPHAGE) 500 MG tablet TAKE 1 TABLET (500 MG TOTAL) BY MOUTH 2 (TWO) TIMES DAILY WITH A MEAL.  60 tablet  2  . montelukast (SINGULAIR) 10 MG tablet TAKE 1 TABLET (10 MG TOTAL) BY MOUTH AT BEDTIME.  30 tablet  6  . Multiple Vitamin (MULTIVITAMIN) tablet Take 1 tablet by mouth daily.      . NON FORMULARY CPAP w 3L o2 each night      . potassium chloride SA (K-DUR,KLOR-CON) 20 MEQ tablet Take 1 tablet (20 mEq total) by mouth daily.      . predniSONE (DELTASONE) 5 MG tablet Take 5 mg by mouth daily.      . rosuvastatin (CRESTOR) 20 MG tablet Take 1 tablet (20  mg total) by mouth daily.  30 tablet  6  . SPIRIVA HANDIHALER 18 MCG inhalation capsule INHALE 1 CAPSULE VIA HANDIHALER ONCE DAILY AT THE SAME TIME EVERY DAY  30 capsule  3  . traMADol (ULTRAM) 50 MG tablet Take 1 tablet (50 mg total) by mouth 2 (two) times daily as needed for pain.  30 tablet  0  . furosemide (LASIX) 40 MG tablet 1 tablet (total 73m) in the AM and 1/2 tablet (total 252m in the PM  45 tablet  3   No current facility-administered medications for this visit.    BP 126/80  Pulse 83  Ht _0  (1.626 m)  Wt 104.69 kg (230 lb 12.8 oz)  BMI 39.6 kg/m2 General: NAD, obese.  Neck:JVP 8-9 cm, no thyromegaly or thyroid nodule.  Lungs: Distant breath sounds bilaterally.  CV: Nondisplaced PMI.  Heart irregular S1/S2, no S3/S4, no murmur.  1+ ankle edema.  No carotid bruit.   Abdomen: Soft, nontender, no hepatosplenomegaly, no distention.  Skin: Intact without lesions or rashes.  Neurologic: Alert and oriented x 3.  Psych: Normal affect. Extremities: No clubbing or cyanosis.   Assessment/Plan: 1. Atrial fibrillation: Chronic x years.  She would be unlikely to remain in NSR if cardioversion were attempted.  Plan rate control.  CHADSVASC = 3 at least.  No history of GI bleeding.  Continue apixaban.  Reasonable rate control with bisoprolol.  2. Pulmonary HTN/cor pulmonale: Significant right heart failure.  Last echo with moderately dilated and dysfunctional RV and PA systolic pressure 65 mmHg.  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  However, she has been diagnosed with rheumatoid arthritis, so there could be a WHO group I component that could be responsive to pulmonary vasodilators.   - I am going to arrange for a right heart cath to document pulmonary arterial HTN (versus pulmonary venous hypertension).   - If patient has PAH, would consider trial of Tyvaso (would use inhaled agent to target well-ventilated lung segments to avoid  worsening of V/Q mismatch with worse hypoxemia that could theoretically occur with use of systemic medication).   3. CKD: Creatinine 1.7 when last checked.  4. RA: Recent diagnosis, being treated (Dr. BeAmil Amen  5. CHF: Primarily RV failure.  She does appear more volume overloaded after a trip to the beach.  Possible sodium load from eating out.   - Increase Lasix to 40 mg bid x 3 days, then 40 qam/20 qpm after that.   - BMET in  1 week.    [name] 06/26/2013

## 2013-06-26 NOTE — Patient Instructions (Addendum)
Take lasix (furosemide) 34m two times a day for 3 days, then take lasix 46min the AM and 201mn the PM. This will be 1 of a lasix 49m22mblet two times a day for 3 days, then 1 of a 49mg38mlet in the AM and 1/2 of a 49mg 67met in the PM. Stay on this dose.   Your physician recommends that you return for lab work in: 1 week--BMET.   Your physician has requested that you have a right heart catheterization. For further information please visit [email or web address] follow instruction sheet, as given. Thursday November 6,2014  Your physician recommends that you schedule a follow-up appointment with Dr McLeanAundra Dubinnday November 24,2014 at 9:45AM.

## 2013-06-27 ENCOUNTER — Other Ambulatory Visit: Payer: Self-pay | Admitting: Internal Medicine

## 2013-06-28 ENCOUNTER — Other Ambulatory Visit: Payer: Self-pay | Admitting: Emergency Medicine

## 2013-07-03 ENCOUNTER — Other Ambulatory Visit (INDEPENDENT_AMBULATORY_CARE_PROVIDER_SITE_OTHER): Payer: Medicare Other

## 2013-07-03 DIAGNOSIS — I2789 Other specified pulmonary heart diseases: Secondary | ICD-10-CM

## 2013-07-03 DIAGNOSIS — I509 Heart failure, unspecified: Secondary | ICD-10-CM

## 2013-07-03 DIAGNOSIS — I5032 Chronic diastolic (congestive) heart failure: Secondary | ICD-10-CM

## 2013-07-03 LAB — BASIC METABOLIC PANEL
BUN: 29 mg/dL — ABNORMAL HIGH (ref 6–23)
CO2: 29 mEq/L (ref 19–32)
Chloride: 99 mEq/L (ref 96–112)
GFR: 28.97 mL/min — ABNORMAL LOW (ref >60.00)
Potassium: 3.8 mEq/L (ref 3.5–5.1)

## 2013-07-05 ENCOUNTER — Encounter (HOSPITAL_COMMUNITY): Payer: Self-pay | Admitting: Pharmacist

## 2013-07-10 ENCOUNTER — Telehealth: Payer: Self-pay | Admitting: Cardiology

## 2013-07-10 DIAGNOSIS — M79605 Pain in left leg: Secondary | ICD-10-CM

## 2013-07-10 DIAGNOSIS — M79604 Pain in right leg: Secondary | ICD-10-CM

## 2013-07-10 NOTE — Telephone Encounter (Signed)
Spoke with patient who states she stopped apixaban on Monday per Dr. Claris Gladden instructions.  I reviewed Dr. Claris Gladden advice regarding scheduling ABIs and patient is agreeable.  I advised her that someone from our office would call her to schedule.  Patient verbalized understanding of instructions and asked if she could take Tramadol tonight for pain.  I advised her that there was no contraindication for Tramadol from a heart cath perspective and patient verbalized understanding. Order for ABIs in epic.

## 2013-07-10 NOTE — Telephone Encounter (Signed)
Spoke with patient who has questions about right heart cath tomorrow.  Patient c/o bilateral leg pain that may be r/t hx of RA.  Patient states her rheumatologist took her off Prednisone last week in order to start a different medication and since tapering off she has experienced severe bilateral leg pain.  Patient states she called her rheumatologist Monday and was advised to resume Prednisone 15 mg daily.  Patient states she is concerned about cardiac cath because she does not know if she can lay flat for an extended time.  Patient would also like to know if Dr. Aundra Dubin thinks that her leg pain could be caused by PV issues.  I advised patient that I would send message to Dr. Aundra Dubin for advice.  Patient verbalized understanding and agreement. I spoke with Dr. Aundra Dubin who advised that cath can be done through her arm to decrease interference with leg pain.  I am routing message to Dr. Aundra Dubin for his review regarding patient's concern about PV issues in legs.

## 2013-07-10 NOTE — Telephone Encounter (Signed)
Leg pain may be from arthritis but could get ABIs to rule out PAD.

## 2013-07-10 NOTE — Telephone Encounter (Signed)
Sent message to [facility] scheduler for appointment for ABIs.

## 2013-07-10 NOTE — Telephone Encounter (Signed)
Make sure that she holds her apixaban tonight and tomorrow morning for RHC.

## 2013-07-10 NOTE — Telephone Encounter (Signed)
New Problem:  Pt has questions about her cath procedure

## 2013-07-11 ENCOUNTER — Ambulatory Visit (HOSPITAL_COMMUNITY)
Admission: RE | Admit: 2013-07-11 | Discharge: 2013-07-11 | Disposition: A | Discharge status: Home / Self Care | Payer: Medicare Other | Source: Ambulatory Visit | Attending: Cardiology | Admitting: Cardiology

## 2013-07-11 ENCOUNTER — Other Ambulatory Visit: Payer: Self-pay | Admitting: Nurse Practitioner

## 2013-07-11 ENCOUNTER — Encounter (HOSPITAL_COMMUNITY)
Admission: RE | Disposition: A | Discharge status: Home / Self Care | Payer: Self-pay | Source: Ambulatory Visit | Attending: Cardiology

## 2013-07-11 DIAGNOSIS — J449 Chronic obstructive pulmonary disease, unspecified: Secondary | ICD-10-CM | POA: Insufficient documentation

## 2013-07-11 DIAGNOSIS — G4733 Obstructive sleep apnea (adult) (pediatric): Secondary | ICD-10-CM | POA: Insufficient documentation

## 2013-07-11 DIAGNOSIS — I4891 Unspecified atrial fibrillation: Secondary | ICD-10-CM | POA: Insufficient documentation

## 2013-07-11 DIAGNOSIS — M069 Rheumatoid arthritis, unspecified: Secondary | ICD-10-CM | POA: Insufficient documentation

## 2013-07-11 DIAGNOSIS — I279 Pulmonary heart disease, unspecified: Secondary | ICD-10-CM

## 2013-07-11 DIAGNOSIS — E876 Hypokalemia: Secondary | ICD-10-CM

## 2013-07-11 DIAGNOSIS — J4489 Other specified chronic obstructive pulmonary disease: Secondary | ICD-10-CM | POA: Insufficient documentation

## 2013-07-11 DIAGNOSIS — E039 Hypothyroidism, unspecified: Secondary | ICD-10-CM | POA: Insufficient documentation

## 2013-07-11 DIAGNOSIS — R7301 Impaired fasting glucose: Secondary | ICD-10-CM | POA: Insufficient documentation

## 2013-07-11 HISTORY — PX: RIGHT HEART CATHETERIZATION: SHX5447

## 2013-07-11 LAB — POCT I-STAT 3, VENOUS BLOOD GAS (G3P V)
Bicarbonate: 27.6 mEq/L — ABNORMAL HIGH (ref 20.0–24.0)
TCO2: 29 mmol/L (ref 0–100)
pCO2, Ven: 43.1 mmHg — ABNORMAL LOW (ref 45.0–50.0)
pH, Ven: 7.414 — ABNORMAL HIGH (ref 7.250–7.300)
pO2, Ven: 33 mmHg (ref 30.0–45.0)

## 2013-07-11 LAB — CBC
MCH: 29.2 pg (ref 26.0–34.0)
MCV: 90 fL (ref 78.0–100.0)
Platelets: 204 10*3/uL (ref 150–400)
RBC: 4.18 MIL/uL (ref 3.87–5.11)
RDW: 17.1 % — ABNORMAL HIGH (ref 11.5–15.5)
WBC: 12.9 10*3/uL — ABNORMAL HIGH (ref 4.0–10.5)

## 2013-07-11 LAB — PROTIME-INR
INR: 1.12 (ref 0.00–1.49)
Prothrombin Time: 14.2 seconds (ref 11.6–15.2)

## 2013-07-11 LAB — BASIC METABOLIC PANEL
BUN: 30 mg/dL — ABNORMAL HIGH (ref 6–23)
Chloride: 98 mEq/L (ref 96–112)
GFR calc Af Amer: 37 mL/min — ABNORMAL LOW (ref >90)
Glucose, Bld: 127 mg/dL — ABNORMAL HIGH (ref 70–99)
Potassium: 3.2 mEq/L — ABNORMAL LOW (ref 3.5–5.1)
Sodium: 142 mEq/L (ref 135–145)

## 2013-07-11 SURGERY — RIGHT HEART CATH
Anesthesia: LOCAL

## 2013-07-11 MED ORDER — SODIUM CHLORIDE 0.9 % IJ SOLN: 3.0000 mL | INTRAMUSCULAR | Status: DC | PRN

## 2013-07-11 MED ORDER — POTASSIUM CHLORIDE 20 MEQ/15ML (10%) PO LIQD
ORAL | Status: AC
  Filled 2013-07-11: qty 30

## 2013-07-11 MED ORDER — SODIUM CHLORIDE 0.9 % IV SOLN: 250.0000 mL | INTRAVENOUS | Status: DC | PRN

## 2013-07-11 MED ORDER — FENTANYL CITRATE 0.05 MG/ML IJ SOLN
INTRAMUSCULAR | Status: AC
  Filled 2013-07-11: qty 2

## 2013-07-11 MED ORDER — MIDAZOLAM HCL 2 MG/2ML IJ SOLN
INTRAMUSCULAR | Status: AC
  Filled 2013-07-11: qty 2

## 2013-07-11 MED ORDER — LIDOCAINE HCL (PF) 1 % IJ SOLN
INTRAMUSCULAR | Status: AC
  Filled 2013-07-11: qty 30

## 2013-07-11 MED ORDER — SODIUM CHLORIDE 0.9 % IV SOLN
INTRAVENOUS | Status: DC
  Administered 2013-07-11: 09:00:00 via INTRAVENOUS

## 2013-07-11 MED ORDER — HEPARIN (PORCINE) IN NACL 2-0.9 UNIT/ML-% IJ SOLN
INTRAMUSCULAR | Status: AC
  Filled 2013-07-11: qty 500

## 2013-07-11 MED ORDER — SODIUM CHLORIDE 0.9 % IJ SOLN: 3.0000 mL | Freq: Two times a day (BID) | INTRAMUSCULAR | Status: DC

## 2013-07-11 MED ORDER — ASPIRIN 81 MG PO CHEW
CHEWABLE_TABLET | ORAL | Status: AC
  Administered 2013-07-11: 81 mg via ORAL
  Filled 2013-07-11: qty 1

## 2013-07-11 MED ORDER — ACETAMINOPHEN 325 MG PO TABS: 650.0000 mg | ORAL_TABLET | ORAL | Status: DC | PRN

## 2013-07-11 MED ORDER — ONDANSETRON HCL 4 MG/2ML IJ SOLN: 4.0000 mg | Freq: Four times a day (QID) | INTRAMUSCULAR | Status: DC | PRN

## 2013-07-11 MED ORDER — ASPIRIN 81 MG PO CHEW
81.0000 mg | CHEWABLE_TABLET | ORAL | Status: AC
  Administered 2013-07-11: 81 mg via ORAL

## 2013-07-11 NOTE — H&P (View-Only) (Signed)
Patient ID: Tracey Duncan, female   DOB: 02/16/1946, 67 y.o.   MRN: [id number] PCP: Baity  67 yo with history of COPD on home oxygen, OSA (? OHS/OSA), rheumatoid arthritis, and chronic atrial fibrillation presents for cardiology followup.  She was diagnosed with atrial fibrillation around 2004.  She failed cardioversion and it has been chronic.  She has noted exertional dyspnea since at least 2012.  She was diagnosed with COPD back in 2002.  She no longer smokes.  She had been on CPAP for OSA prior, but was started on home oxygen all the time by Dr. Byrum.  No tachypalpitations, no chest pain.  Echo in 2/14 showed a moderately dilated RV with moderately decreased systolic function, PA systolic pressure 65 mmHg. She was started on apixaban.   V/Q scan was negative for chronic PE.  She has been diagnosed with rheumatoid arthritis since I last saw her and is on Arava and prednisone.  She is having less joint pain.    Since last appointment, Tracey Duncan has lost another 4 lbs.  However, for the last 2 weeks (since vacation to the beach), she has been breathing worse.  She has also re-developed ankle edema.  She is short of breath walking short distances such as around her house.  Stable orthopnea.  No chest pain, no lightheadedness.   Labs (4/14): RF < 10, ANA negative, BNP 294, K 3.8, creatinine 1.7  Labs (9/14): K 4.5, creatinine 1.74  PMH: 1. OSA/OHS: On CPAP at night and oxygen during the day. 2. COPD: Diagnosed 2002.  FEV1 65% predicted in 4/14.  3. HTN 4. Chronic atrial fibrillation: Failed DCCV in 12/04.   5. 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  Echo (2/14) with EF 55-60%, moderately dilated RV with moderately decreased systolic function, PA systolic pressure 65 mmHg.  V/Q scan in 3/14 was negative for chronic PE.  6. Lexiscan Thallium in 11/11 with no evidence for ischemia or infarction.  7. Impaired fasting glucose. 8. Hypothyroidism 9.  Obesity. 10. CKD 11. Arthritis  SH: Widow, [place] to live with sister.  Quit smoking in 1993.  Rare ETOH. Retired accountant.   FH: Brother with MI at 60, father with MI at 36.  ROS: All systems reviewed and negative except as per HPI.   Current Outpatient Prescriptions  Medication Sig Dispense Refill  . albuterol (PROAIR HFA) 108 (90 BASE) MCG/ACT inhaler Inhale 2 puffs into the lungs every 6 (six) hours as needed.      . albuterol (PROVENTIL) (2.5 MG/3ML) 0.083% nebulizer solution Take 3 mLs (2.5 mg total) by nebulization every 6 (six) hours as needed.  360 mL  11  . apixaban (ELIQUIS) 5 MG TABS tablet Take 1 tablet (5 mg total) by mouth 2 (two) times daily.  60 tablet  4  . bisoprolol (ZEBETA) 10 MG tablet TAKE 1 TABLET (10 MG TOTAL) BY MOUTH DAILY.  30 tablet  6  . calcium-vitamin D (OSCAL WITH D) 500-200 MG-UNIT per tablet Take 1 tablet by mouth daily.      . cetirizine (ZYRTEC) 10 MG tablet Take 10 mg by mouth daily.      . [employer] VITAMIN D 2000 UNITS CAPS TAKE ONE CAPSULE EVERY DAY  30 capsule  5  . Fluticasone-Salmeterol (ADVAIR) 250-50 MCG/DOSE AEPB Inhale 1 puff into the lungs every 12 (twelve) hours.  60 each  5  . leflunomide (ARAVA) 10 MG tablet   Take 10 mg by mouth daily.      . levothyroxine (SYNTHROID, LEVOTHROID) 50 MCG tablet TAKE 1 TABLET (50 MCG TOTAL) BY MOUTH DAILY.  90 tablet  1  . metFORMIN (GLUCOPHAGE) 500 MG tablet TAKE 1 TABLET (500 MG TOTAL) BY MOUTH 2 (TWO) TIMES DAILY WITH A MEAL.  60 tablet  2  . montelukast (SINGULAIR) 10 MG tablet TAKE 1 TABLET (10 MG TOTAL) BY MOUTH AT BEDTIME.  30 tablet  6  . Multiple Vitamin (MULTIVITAMIN) tablet Take 1 tablet by mouth daily.      . NON FORMULARY CPAP w 3L o2 each night      . potassium chloride SA (K-DUR,KLOR-CON) 20 MEQ tablet Take 1 tablet (20 mEq total) by mouth daily.      . predniSONE (DELTASONE) 5 MG tablet Take 5 mg by mouth daily.      . rosuvastatin (CRESTOR) 20 MG tablet Take 1 tablet (20  mg total) by mouth daily.  30 tablet  6  . SPIRIVA HANDIHALER 18 MCG inhalation capsule INHALE 1 CAPSULE VIA HANDIHALER ONCE DAILY AT THE SAME TIME EVERY DAY  30 capsule  3  . traMADol (ULTRAM) 50 MG tablet Take 1 tablet (50 mg total) by mouth 2 (two) times daily as needed for pain.  30 tablet  0  . furosemide (LASIX) 40 MG tablet 1 tablet (total 40mg) in the AM and 1/2 tablet (total 20mg) in the PM  45 tablet  3   No current facility-administered medications for this visit.    BP 126/80  Pulse 83  Ht 5' 4" (1.626 m)  Wt 104.69 kg (230 lb 12.8 oz)  BMI 39.6 kg/m2 General: NAD, obese.  Neck:JVP 8-9 cm, no thyromegaly or thyroid nodule.  Lungs: Distant breath sounds bilaterally.  CV: Nondisplaced PMI.  Heart irregular S1/S2, no S3/S4, no murmur.  1+ ankle edema.  No carotid bruit.   Abdomen: Soft, nontender, no hepatosplenomegaly, no distention.  Skin: Intact without lesions or rashes.  Neurologic: Alert and oriented x 3.  Psych: Normal affect. Extremities: No clubbing or cyanosis.   Assessment/Plan: 1. Atrial fibrillation: Chronic x years.  She would be unlikely to remain in NSR if cardioversion were attempted.  Plan rate control.  CHADSVASC = 3 at least.  No history of GI bleeding.  Continue apixaban.  Reasonable rate control with bisoprolol.  2. Pulmonary HTN/cor pulmonale: Significant right heart failure.  Last echo with moderately dilated and dysfunctional RV and PA systolic pressure 65 mmHg.  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  However, she has been diagnosed with rheumatoid arthritis, so there could be a WHO group I component that could be responsive to pulmonary vasodilators.   - I am going to arrange for a right heart cath to document pulmonary arterial HTN (versus pulmonary venous hypertension).   - If patient has PAH, would consider trial of Tyvaso (would use inhaled agent to target well-ventilated lung segments to avoid  worsening of V/Q mismatch with worse hypoxemia that could theoretically occur with use of systemic medication).   3. CKD: Creatinine 1.7 when last checked.  4. RA: Recent diagnosis, being treated (Dr. Beekman).  5. CHF: Primarily RV failure.  She does appear more volume overloaded after a trip to the beach.  Possible sodium load from eating out.   - Increase Lasix to 40 mg bid x 3 days, then 40 qam/20 qpm after that.   - BMET in   1 week.    Goldye Tourangeau 06/26/2013   

## 2013-07-11 NOTE — Telephone Encounter (Signed)
Pt will resume all medications as indicated in her discharge instructions from today's right heart cath Horton Chin RN

## 2013-07-11 NOTE — Telephone Encounter (Signed)
Following up     Pt needs a call back about when she should start taking her ELIQUIS again??

## 2013-07-11 NOTE — Interval H&P Note (Signed)
History and Physical Interval Note:  07/11/2013 9:22 AM  Tracey Duncan  has presented today for surgery, with the diagnosis of CHF  The various methods of treatment have been discussed with the patient and family. After consideration of risks, benefits and other options for treatment, the patient has consented to  Procedure(s): RIGHT HEART CATH (N/A) as a surgical intervention .  The patient's history has been reviewed, patient examined, no change in status, stable for surgery.  I have reviewed the patient's chart and labs.  Questions were answered to the patient's satisfaction.     Ivan Maskell [employer]

## 2013-07-11 NOTE — CV Procedure (Addendum)
   Cardiac Catheterization Procedure Note  Name: Tracey Duncan MRN: [id number] DOB: 1945/11/02  Procedure: Right Heart Cath  Indication: Pulmonary hypertension   Procedural Details: The right brachial area was prepped, draped, and anesthetized with 1% lidocaine. A venous IV was placed in the right brachial area.  This was switched out for a 30F venous sheath.  A 30F Swan-Ganz catheter was used for the right heart catheterization. Standard protocol was followed for recording of right heart pressures and sampling of oxygen saturations. Fick cardiac output was calculated.  There were no immediate procedural complications. The patient was transferred to the post catheterization recovery area for further monitoring.  Procedural Findings: Hemodynamics (mmHg) RA mean 8 RV 70/7 PA 68/28, mean 45 PCWP mean 11  Oxygen saturations (on 5L home oxygen): PA 64% AO 99%  Cardiac Output (Fick) 4.74  Cardiac Index (Fick) 2.29  PVR 7.2 WU  Final Conclusions:  Normal left heart filling pressure.  Moderate to severe pulmonary arterial hypertension.  Per pulmonary, her hypoxia is about of proportion to her parenchymal lung disease.  FEV1 65%.  Given concern for contribution from rheumatoid arthritis (Group I PAH), will arrange Tyvaso initiation. Will use inhaled agent given underlying parenchymal lung disease to limit increased V/Q mismatching.   Complains of leg cramps.  K low, will increase to 40 qam, 20 qpm with BMET in 1 week.  Followup with me in 2 wks.    [name] 07/11/2013, 10:20 AM

## 2013-07-12 LAB — GLUCOSE, CAPILLARY: Glucose-Capillary: 133 mg/dL — ABNORMAL HIGH (ref 70–99)

## 2013-07-15 ENCOUNTER — Telehealth: Payer: Self-pay | Admitting: *Deleted

## 2013-07-15 NOTE — Telephone Encounter (Signed)
Application for Tyvaso faxed to [facility] [phone]. Pt advised.

## 2013-07-16 ENCOUNTER — Telehealth: Payer: Self-pay | Admitting: Cardiology

## 2013-07-16 ENCOUNTER — Encounter (HOSPITAL_COMMUNITY): Payer: Medicare Other

## 2013-07-16 NOTE — Telephone Encounter (Signed)
New problem   Receive a referral from Dr. Aundra Dubin regarding patient.    verify information on patient can they called the patient    Where therapy will be started in home or hospital.

## 2013-07-16 NOTE — Telephone Encounter (Signed)
Spoke with Tracey Duncan, verified pt is aware of the referral and will be started at home.

## 2013-07-19 ENCOUNTER — Other Ambulatory Visit (INDEPENDENT_AMBULATORY_CARE_PROVIDER_SITE_OTHER): Payer: Medicare Other

## 2013-07-19 ENCOUNTER — Ambulatory Visit (HOSPITAL_COMMUNITY): Payer: Medicare Other | Attending: Cardiology

## 2013-07-19 ENCOUNTER — Encounter: Payer: Self-pay | Admitting: Cardiology

## 2013-07-19 DIAGNOSIS — Z951 Presence of aortocoronary bypass graft: Secondary | ICD-10-CM | POA: Insufficient documentation

## 2013-07-19 DIAGNOSIS — G4733 Obstructive sleep apnea (adult) (pediatric): Secondary | ICD-10-CM | POA: Insufficient documentation

## 2013-07-19 DIAGNOSIS — N189 Chronic kidney disease, unspecified: Secondary | ICD-10-CM | POA: Insufficient documentation

## 2013-07-19 DIAGNOSIS — M79604 Pain in right leg: Secondary | ICD-10-CM

## 2013-07-19 DIAGNOSIS — I129 Hypertensive chronic kidney disease with stage 1 through stage 4 chronic kidney disease, or unspecified chronic kidney disease: Secondary | ICD-10-CM | POA: Insufficient documentation

## 2013-07-19 DIAGNOSIS — E876 Hypokalemia: Secondary | ICD-10-CM

## 2013-07-19 DIAGNOSIS — I798 Other disorders of arteries, arterioles and capillaries in diseases classified elsewhere: Secondary | ICD-10-CM | POA: Insufficient documentation

## 2013-07-19 DIAGNOSIS — I2789 Other specified pulmonary heart diseases: Secondary | ICD-10-CM | POA: Insufficient documentation

## 2013-07-19 DIAGNOSIS — J449 Chronic obstructive pulmonary disease, unspecified: Secondary | ICD-10-CM | POA: Insufficient documentation

## 2013-07-19 DIAGNOSIS — E1159 Type 2 diabetes mellitus with other circulatory complications: Secondary | ICD-10-CM | POA: Insufficient documentation

## 2013-07-19 DIAGNOSIS — I4891 Unspecified atrial fibrillation: Secondary | ICD-10-CM | POA: Insufficient documentation

## 2013-07-19 DIAGNOSIS — J4489 Other specified chronic obstructive pulmonary disease: Secondary | ICD-10-CM | POA: Insufficient documentation

## 2013-07-19 DIAGNOSIS — Z87891 Personal history of nicotine dependence: Secondary | ICD-10-CM | POA: Insufficient documentation

## 2013-07-19 DIAGNOSIS — M069 Rheumatoid arthritis, unspecified: Secondary | ICD-10-CM | POA: Insufficient documentation

## 2013-07-19 DIAGNOSIS — I2510 Atherosclerotic heart disease of native coronary artery without angina pectoris: Secondary | ICD-10-CM | POA: Insufficient documentation

## 2013-07-19 DIAGNOSIS — Z9981 Dependence on supplemental oxygen: Secondary | ICD-10-CM | POA: Insufficient documentation

## 2013-07-19 LAB — BASIC METABOLIC PANEL
CO2: 29 mEq/L (ref 19–32)
Calcium: 9.7 mg/dL (ref 8.4–10.5)
Chloride: 100 mEq/L (ref 96–112)
Creatinine, Ser: 1.6 mg/dL — ABNORMAL HIGH (ref 0.4–1.2)
GFR: 33.52 mL/min — ABNORMAL LOW (ref >60.00)
Glucose, Bld: 157 mg/dL — ABNORMAL HIGH (ref 70–99)

## 2013-07-22 ENCOUNTER — Telehealth: Payer: Self-pay | Admitting: *Deleted

## 2013-07-22 NOTE — Telephone Encounter (Signed)
I called pt with LEA results. Pt states she is still having leg cramps at night. Reviewed with Dr Aundra Dubin --he ordered increase KCL by 20 mEq daily (pt currently take 3 per day--she will increase to 4 per day) and take magnesium oxide 479m daily. Pt advised, verbalized understanding.

## 2013-07-25 ENCOUNTER — Other Ambulatory Visit: Payer: Self-pay | Admitting: Internal Medicine

## 2013-07-27 ENCOUNTER — Other Ambulatory Visit: Payer: Self-pay | Admitting: Cardiology

## 2013-07-29 ENCOUNTER — Ambulatory Visit (INDEPENDENT_AMBULATORY_CARE_PROVIDER_SITE_OTHER): Payer: Medicare Other | Admitting: Cardiology

## 2013-07-29 ENCOUNTER — Encounter: Payer: Self-pay | Admitting: Cardiology

## 2013-07-29 ENCOUNTER — Telehealth: Payer: Self-pay | Admitting: *Deleted

## 2013-07-29 VITALS — BP 100/64 | HR 102 | Ht 64.0 in | Wt 222.1 lb

## 2013-07-29 DIAGNOSIS — R0602 Shortness of breath: Secondary | ICD-10-CM

## 2013-07-29 DIAGNOSIS — I279 Pulmonary heart disease, unspecified: Secondary | ICD-10-CM

## 2013-07-29 DIAGNOSIS — I2721 Secondary pulmonary arterial hypertension: Secondary | ICD-10-CM

## 2013-07-29 DIAGNOSIS — R252 Cramp and spasm: Secondary | ICD-10-CM

## 2013-07-29 DIAGNOSIS — I2781 Cor pulmonale (chronic): Secondary | ICD-10-CM

## 2013-07-29 DIAGNOSIS — I482 Chronic atrial fibrillation, unspecified: Secondary | ICD-10-CM

## 2013-07-29 DIAGNOSIS — I2789 Other specified pulmonary heart diseases: Secondary | ICD-10-CM

## 2013-07-29 DIAGNOSIS — I5032 Chronic diastolic (congestive) heart failure: Secondary | ICD-10-CM

## 2013-07-29 DIAGNOSIS — M79609 Pain in unspecified limb: Secondary | ICD-10-CM

## 2013-07-29 DIAGNOSIS — I4891 Unspecified atrial fibrillation: Secondary | ICD-10-CM

## 2013-07-29 LAB — BRAIN NATRIURETIC PEPTIDE: Pro B Natriuretic peptide (BNP): 300 pg/mL — ABNORMAL HIGH (ref 0.0–100.0)

## 2013-07-29 LAB — BASIC METABOLIC PANEL
BUN: 30 mg/dL — ABNORMAL HIGH (ref 6–23)
CO2: 28 mEq/L (ref 19–32)
Creatinine, Ser: 1.6 mg/dL — ABNORMAL HIGH (ref 0.4–1.2)
GFR: 33.52 mL/min — ABNORMAL LOW (ref >60.00)
Glucose, Bld: 152 mg/dL — ABNORMAL HIGH (ref 70–99)
Potassium: 3.9 mEq/L (ref 3.5–5.1)

## 2013-07-29 MED ORDER — ZOLPIDEM TARTRATE 5 MG PO TABS: 5.0000 mg | ORAL_TABLET | Freq: Every evening | ORAL | Status: DC | PRN

## 2013-07-29 NOTE — Progress Notes (Signed)
Patient ID: Tracey Duncan, female   DOB: 1945/11/29, 66 y.o.   MRN: [id number] PCP: Baity  67 yo with history of COPD on home oxygen, OSA (? OHS/OSA), rheumatoid arthritis, and chronic atrial fibrillation presents for cardiology followup.  She was diagnosed with atrial fibrillation around 2004.  She failed cardioversion and it has been chronic.  She has noted exertional dyspnea since at least 2012.  She was diagnosed with COPD back in 2002.  She no longer smokes.  She had been on CPAP for OSA prior, but was started on home oxygen all the time by Dr. Lamonte Sakai.  No tachypalpitations, no chest pain.  Echo in 2/14 showed a moderately dilated RV with moderately decreased systolic function, PA systolic pressure 65 mmHg. She was started on apixaban.   V/Q scan was negative for chronic PE.  She has been diagnosed with rheumatoid arthritis and is on Arava and prednisone.  I did a right heart cath in 11/14 that confirmed moderate to severe PAH with PVR 7.4 WU.    Currently, main complaint is leg pain.  Her calves and feet bilaterally ache.  This has been present for several weeks.  It seemed to start after prednisone was stopped.  She has been put back on prednisone 5 mg daily, but this has not stopped the pain.  She is on Crestor but has taken this long-term with no prior problems.  ABIs were normal in 11/14.  She is on supplemental K and Mg and levels are now within normal range.    Breathing is stable.  She is short of breath if she walks more than about 5 minutes.  She does not think she can do a 6 minute walk test today due to her lower leg/foot aching.  No lightheadedness or syncope.  Weight is down 8 lbs on higher Lasix dose.   Labs (4/14): RF < 10, ANA negative, BNP 294, K 3.8, creatinine 1.7  Labs (9/14): K 4.5, creatinine 1.74 Labs (11/14): K 4.5, creatinine 1.6  PMH: 1. OSA/OHS: On CPAP at night and oxygen during the day. 2. COPD: Diagnosed 2002.  FEV1 65% predicted in 4/14.  3. HTN 4. Chronic atrial  fibrillation: Failed DCCV in 12/04.   5. 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  Echo (2/14) with EF 55-60%, moderately dilated RV with moderately decreased systolic function, PA systolic pressure 65 mmHg.  V/Q scan in 3/14 was negative for chronic PE. PFTs (4/14): FEV1 65% predicted, mild obstructive defect with severely decreased DLCO.  RHC (11/14): mean RA 8, PA 68/28 mean 45, mean PCWP 11, CI 2.29, PVR 7.2 WU.  6. Lexiscan Thallium in 11/11 with no evidence for ischemia or infarction.  7. Impaired fasting glucose. 8. Hypothyroidism 9. Obesity. 10. CKD 11. Arthritis 12. ABIs (11/14): Normal  SH: Widow, moved to [place] from [place] to live with sister.  Quit smoking in 1993.  Rare ETOH. Retired Optometrist.   FH: Brother with MI at 47, father with MI at 1.  ROS: All systems reviewed and negative except as per HPI.   Current Outpatient Prescriptions  Medication Sig Dispense Refill  . acetaminophen (TYLENOL) 500 MG tablet Take 1,000 mg by mouth every 6 (six) hours as needed for pain.      Marland Kitchen albuterol (PROAIR HFA) 108 (90 BASE) MCG/ACT inhaler Inhale 2 puffs into the lungs every 6 (six) hours as needed for wheezing or shortness of breath.       Marland Kitchen  albuterol (PROVENTIL) (2.5 MG/3ML) 0.083% nebulizer solution Take 2.5 mg by nebulization 2 (two) times daily as needed for wheezing or shortness of breath.      . Artificial Tear Ointment (ARTIFICIAL TEARS) ointment Place 1 drop into both eyes 2 (two) times daily as needed (dry eyes).      . bisoprolol (ZEBETA) 10 MG tablet Take 10 mg by mouth daily.      . calcium carbonate (TUMS - DOSED IN MG ELEMENTAL CALCIUM) 500 MG chewable tablet Chew 2 tablets by mouth daily as needed for heartburn.      . calcium-vitamin D (OSCAL WITH D) 500-200 MG-UNIT per tablet Take 1 tablet by mouth daily.      . cetirizine (ZYRTEC) 10 MG tablet Take 10 mg by mouth at bedtime.       . Cholecalciferol (VITAMIN D) 2000 UNITS  tablet Take 2,000 Units by mouth daily.      . Fluticasone-Salmeterol (ADVAIR) 250-50 MCG/DOSE AEPB Inhale 1 puff into the lungs every 12 (twelve) hours.      Marland Kitchen leflunomide (ARAVA) 10 MG tablet Take 10 mg by mouth daily.      Marland Kitchen levothyroxine (SYNTHROID, LEVOTHROID) 50 MCG tablet Take 50 mcg by mouth daily before breakfast.      . Magnesium 200 MG TABS 2 tablets (total 418m) daily      . metFORMIN (GLUCOPHAGE) 500 MG tablet TAKE 1 TABLET (500 MG TOTAL) BY MOUTH 2 (TWO) TIMES DAILY WITH A MEAL.  60 tablet  2  . montelukast (SINGULAIR) 10 MG tablet Take 10 mg by mouth at bedtime.      . Multiple Vitamins-Minerals (CENTRUM SILVER PO) Take 1 tablet by mouth daily.      . potassium chloride SA (K-DUR,KLOR-CON) 20 MEQ tablet 2 tablets (total 40 mEq) two times a day      . predniSONE (DELTASONE) 5 MG tablet 5 mg daily.      . rosuvastatin (CRESTOR) 20 MG tablet Take 1 tablet (20 mg total) by mouth at bedtime. ON HOLD 07/29/13      . tiotropium (SPIRIVA) 18 MCG inhalation capsule Place 18 mcg into inhaler and inhale daily.      . traMADol (ULTRAM) 50 MG tablet Take 50 mg by mouth every 8 (eight) hours as needed for pain.      .Marland KitchenELIQUIS 5 MG TABS tablet TAKE 1 TABLET TWICE A DAY  60 tablet  4  . furosemide (LASIX) 40 MG tablet TAKE 1 TABLET BY MOUTH EVERY DAY  90 tablet  1   No current facility-administered medications for this visit.    BP 100/64  Pulse 102  Ht _0  (1.626 m)  Wt 100.753 kg (222 lb 1.9 oz)  BMI 38.11 kg/m2 General: NAD, obese.  Neck:JVP 8 cm, no thyromegaly or thyroid nodule.  Lungs: Distant breath sounds bilaterally.  CV: Nondisplaced PMI.  Heart irregular S1/S2, no S3/S4, no murmur.  1+ ankle edema.  No carotid bruit.   Abdomen: Soft, nontender, no hepatosplenomegaly, no distention.  Skin: Intact without lesions or rashes.  Neurologic: Alert and oriented x 3.  Psych: Normal affect. Extremities: No clubbing or cyanosis.  Some tenderness to palpation of calves.    Assessment/Plan: 1. Atrial fibrillation: Chronic x years.  She would be unlikely to remain in NSR if cardioversion were attempted.  Plan rate control.  CHADSVASC = 3 at least.  No history of GI bleeding.  Continue apixaban.  Reasonable rate control with bisoprolol.  2. Pulmonary arterial HTN/cor  pulmonale: Significant right heart failure.  Last echo with moderately dilated and dysfunctional RV and PA systolic pressure 65 mmHg, PA systolic pressure 92/95 on RHC with normal PCWP.  PAH likely has mixed etiology: I suspect that there is a Group I PAH component from rheumatoid arthritis, but patient also has COPD and OHS/OSA.  FEV1 is 65%, so the degree of pulmonary HTN does seem out of proportion to COPD alone. - Given suspected component of Group I PAH, I would like to try her on Tyvaso (would use inhaled agent to target well-ventilated lung segments to avoid worsening of V/Q mismatch with worse hypoxemia that could theoretically occur with use of systemic medication in this patient with parenchymal lung disease from COPD).  This has been approved and she is awaiting delivery.  3. CKD: Creatinine 1.6 when last checked.  BMET today.  4. RA: Recent diagnosis, being treated (Dr. Amil Amen).  5. CHF: Primarily RV failure.  PCWP normal on recent RHC.  I will continue her on the current Lasix dose.  She is down 9 lbs since last appointment.  BMET/BNP today.  6. Leg pain: Tender calves/feet.  ABIs normal so do not think this is a circulation issue.  K and Mg have been optimized. I will check CPK level.  I will also have her hold Crestor for 10 days.  If pain does not improve off Crestor, would restart it. I would like her to go back to her rheumatologist as the pain seemed to start after prednisone was cut back.   Tracey Duncan 07/29/2013

## 2013-07-29 NOTE — Telephone Encounter (Signed)
LM for pt prescription for ambien 52m hs prn sleep to pharmacy

## 2013-07-29 NOTE — Telephone Encounter (Signed)
Larey Dresser, MD             ambien 5 mg daily prn.

## 2013-07-29 NOTE — Telephone Encounter (Signed)
Patient stated that she was seen today in the office and there was supposed to be a sleeping medication called in for her, but she went to the pharmacy and it was not there. Please advise. Thanks, MI

## 2013-07-29 NOTE — Patient Instructions (Signed)
Do not take Crestor for 2 weeks. Call in 2 weeks and let Dr Aundra Dubin know if your leg cramps are better off crestor. If they are not better off crestor he will want you to start taking it again.  See you rheumatologist.  Your physician recommends that you return for lab work today--BMET/BNP/Magnesium/CK  Your physician recommends that you schedule a follow-up appointment in: 2 months with Dr Aundra Dubin.

## 2013-07-30 ENCOUNTER — Telehealth: Payer: Self-pay | Admitting: Cardiology

## 2013-07-30 NOTE — Telephone Encounter (Signed)
New message  Patient spoke with your yesterday and your were going to call in [place] for her, she has been to the pharmacy twice and there is no RX. Please call patient & advise

## 2013-07-30 NOTE — Telephone Encounter (Signed)
Spoke with Jenny Reichmann at [facility] and gave her prescription for ambien 88m #15 hs prn sleep. Pt is aware.

## 2013-08-06 ENCOUNTER — Telehealth: Payer: Self-pay | Admitting: Emergency Medicine

## 2013-08-06 NOTE — Telephone Encounter (Signed)
I spoke with the pt and she states back on 05-29-13 when she saw Dr. Lamonte Sakai she gave his nurse a fax number ans asked that a letter be sent to the city of [place] about her not being able to take the trash cans to the end of the driveway. The pt is calling to see if this fas done because she has not heard anything about this.  I see that a letter was done and is in her chart but I cannot confirm that it was definitely faxed because there is not any documentation of this. The pt is going to look-up the fax number and call us back so we can re-send the letter that was done on 05-29-13. Will await call back. [place]away Bing, CMA

## 2013-08-08 NOTE — Telephone Encounter (Signed)
I spoke with pt and confirmed fax #. This has been faxed and pt aware. Nothing further needed

## 2013-08-08 NOTE — Telephone Encounter (Signed)
Pt says she was to call back with a number to fax a letter for her, can be faxed to [place]: [name] at [phone].Tracey Duncan

## 2013-08-14 ENCOUNTER — Telehealth: Payer: Self-pay | Admitting: Cardiology

## 2013-08-14 DIAGNOSIS — I2789 Other specified pulmonary heart diseases: Secondary | ICD-10-CM

## 2013-08-14 DIAGNOSIS — I5032 Chronic diastolic (congestive) heart failure: Secondary | ICD-10-CM

## 2013-08-14 NOTE — Telephone Encounter (Signed)
Pt. Called because she states has been off Crestor for more then 2 weeks due to leg pain. Pt  has been taking prednisone also, and is decreasing the dose 2 pills now and will decrease it to one tablet a day. Pt's leg pain has improved significantly,to just a little bit of pain not much. Pt would like to know if she can continue to stay off Crestor or go back to take the medication.

## 2013-08-14 NOTE — Telephone Encounter (Signed)
If muscle pain is better off Crestor, would have her try pravastatin 20 mg daily instead, with lipids/LFTs in 2 months.

## 2013-08-14 NOTE — Telephone Encounter (Signed)
New problem:  Pt states she is calling to report how she is feeling... Pt states since her appt ... Her leg cramps are better. She states Dr. Aundra Dubin took her off of Crestor. Pt also states her other doctor put her on prednisone. PT is wanting to know which medication change could have made her feel better. Also, pt is asking if she needs any other med to replace the crestor.

## 2013-08-15 MED ORDER — PRAVASTATIN SODIUM 20 MG PO TABS: 20.0000 mg | ORAL_TABLET | Freq: Every evening | ORAL | Status: DC

## 2013-08-15 NOTE — Telephone Encounter (Signed)
Pt will change to pravastatin from crestor, fasting lipid/liver profile 10/10/13

## 2013-08-22 ENCOUNTER — Telehealth: Payer: Self-pay | Admitting: Emergency Medicine

## 2013-08-22 MED ORDER — AZITHROMYCIN 250 MG PO TABS: ORAL_TABLET | ORAL | Status: DC

## 2013-08-22 MED ORDER — PREDNISONE 10 MG PO TABS: ORAL_TABLET | ORAL | Status: DC

## 2013-08-22 NOTE — Telephone Encounter (Signed)
Per RB sent pred taper and antibiotic into pharm.  Pt aware and will pick this up and begin. Nothing further is needed.

## 2013-08-22 NOTE — Telephone Encounter (Signed)
Called, spoke with pt - Reports the nurse from [facility] was at her house last night to start her on Tyvaso.  Per pt, the nurse reported pt had "bad right side congestion."  Pt reports cough x 3 wks with yellow to green mucus and some wheezing.  Is wearing o2 so is unsure is she's having increased SOB.  Denies chest tightness, chest pain, or f/c/s.  I offered OV today - pt declined.  Reports her caregiver isn't available to come with her today.  Pt is requesting rx be called in.  Dr. Lamonte Sakai, pls advise.  Thank you.  [facility]  No Known Allergies

## 2013-08-22 NOTE — Telephone Encounter (Signed)
Azithromycin > z-pack Prednisone: Take 9m daily for 3 days, then 341mdaily for 3 days, then 2069maily for 3 days, then 3m31mily for 3 days, then stop Try NSW's and chlorpheniramine Needs an OV with us, [place] or TP

## 2013-09-02 LAB — HM DEXA SCAN: HM Dexa Scan: -2.5

## 2013-09-19 ENCOUNTER — Other Ambulatory Visit: Payer: Self-pay | Admitting: *Deleted

## 2013-09-26 ENCOUNTER — Encounter: Payer: Self-pay | Admitting: *Deleted

## 2013-09-26 ENCOUNTER — Ambulatory Visit (INDEPENDENT_AMBULATORY_CARE_PROVIDER_SITE_OTHER): Payer: Medicare Other | Admitting: Emergency Medicine

## 2013-09-26 ENCOUNTER — Encounter: Payer: Self-pay | Admitting: Emergency Medicine

## 2013-09-26 VITALS — BP 118/78 | HR 97 | Ht 64.0 in | Wt 224.0 lb

## 2013-09-26 DIAGNOSIS — Z9989 Dependence on other enabling machines and devices: Secondary | ICD-10-CM

## 2013-09-26 DIAGNOSIS — J449 Chronic obstructive pulmonary disease, unspecified: Secondary | ICD-10-CM

## 2013-09-26 DIAGNOSIS — G4733 Obstructive sleep apnea (adult) (pediatric): Secondary | ICD-10-CM

## 2013-09-26 DIAGNOSIS — J441 Chronic obstructive pulmonary disease with (acute) exacerbation: Secondary | ICD-10-CM

## 2013-09-26 DIAGNOSIS — I2789 Other specified pulmonary heart diseases: Secondary | ICD-10-CM

## 2013-09-26 NOTE — Progress Notes (Signed)
Subjective:  Patient ID: Tracey Duncan, female    DOB: [birth year], 68 y.o.   MRN: [id number] HPI 68 yo woman, former tobacco (27 pk-yrs), hx of OSA dx in 4/13 (on CPAP + O2), HTN, A fib (failed cardioversion), allergies. COPD dx in 2002, currently on Advair + Spiriva. Albuterol prn, uses it only . Since moving to [place] in last 6 months she has had more SOB. She is having occasional wheeze, no cough. No CP.  Significant hypoxemia on arrival. Needs a local PCP and cardiologist  ROV 10/30/12 -- COPD, OSA, allergies, A Fib, HTN. She was seen by Dr Aundra Dubin and started anticoagulation for her A fib. Also had TTE that showed PAH with PASP ~ 62mHg. She is wearing her O2 reliably - has a documented desat on O2 at Dr MClaris Gladdenoffice. She is ordered for 6L and 8L with exertion.  Currently on advair, spiriva. Singulair + zyrtec.   ROV 12/05/12 -- COPD, OSA, allergies, A Fib, HTN. Also secondary PAH and severe hypoxemia.  PFT today with moderately severe AFL, no BD response, decreased DLCO. A V/Q on 11/21/12 was normal. Her lasix has been adjusted by Dr MAundra Dubin She is using her oximizer reliably. She is trying to wear CPAP reliably, but still poor sleep - difficulty falling asleep and also staying asleep. Supposed to have ONO on CPAP 4/3.  ROV 01/16/13 --  COPD (FEV1 1.38L), OSA on CPAP, allergies, A Fib, HTN. Also secondary PAH and severe hypoxemia.  Returns for f/u. Reports that her wt has dropped another 8 lbs since our last visit,  breathing well. ONO on CPAP + 3L/min performed > shows 3 discrete episodes of desat that could represent REM-related desats (the report says 2, but it was really 3L/min). Unfortunately about a week ago she developed R UE, hand pain, L knee pain. No f/c, no medication changes or other infectious sx. She follows w [facility].   PULMONARY FUNCTON TEST 12/05/2012  FVC 2.15  FEV1 1.38  FEV1/FVC 64.2  FVC  % Predicted 73  FEV % Predicted 65  FeF 25-75 .65  FeF 25-75 % Predicted 2.44       05/29/13 -- COPD, OSA, newly dx RA. Secondary PAH and hypoxemia. She was started on prednisone, now weaning and being converted to [place] She feels MUCH better, is more active. She is compliant with her oximizer. She wears her CPAP. Compliance data shows that she wears it, she states that it helps her clinically.  She is having more nasal drainage, cough with clear sputum.   09/26/13 -- COPD, OSA, newly dx RA. Secondary PAH and hypoxemia. She tells me that her breathing has been worse over the last several months. R heart cath (11/14): mean RA 8, PA 68/28 mean 45, mean PCWP 11, CI 2.29, PVR 7.2 WU. She was started on Tyvaso by Dr MAundra Dubin has titrated up to 9 puffs qid. She unfortunately feels worse not better, is now limited as to her activity, cannot walk any significant distance (a clear change). She also has now weaned off of prednisone and is on [place] which could be an influence here. She is on Advair and Spiriva.    Objective:   Physical Exam Filed Vitals:   09/26/13 1010  BP: 118/78  Pulse: 97  Height: _0  (1.626 m)  Weight: 224 lb (101.606 kg)  SpO2: 93%   Gen: Pleasant, obese, in no distress,  normal affect  ENT: No lesions,  mouth clear,  oropharynx clear, no postnasal drip  Neck: No JVD, no TMG, no carotid bruits  Lungs: No use of accessory muscles, clear without rales or rhonchi  Cardiovascular: RRR, heart sounds normal, no murmur or gallops, 1+ peripheral edema  Musculoskeletal: No deformities, no cyanosis or clubbing  Neuro: alert, non focal  Skin: Warm, no lesions or rashes   R heart cath 07/11/13  -- RA mean 8  RV 70/7  PA 68/28, mean 45  PCWP mean 11  Oxygen saturations (on 5L home oxygen):  PA 64%  AO 99%  Cardiac Output (Fick) 4.74  Cardiac Index (Fick) 2.29  PVR 7.2 WU   10/22/12 --  Study Conclusions - Left ventricle: The cavity size was normal. Wall thickness was normal. Systolic function was normal. The estimated ejection fraction was in the  range of 55% to 60%. Wall motion was normal; there were no regional wall motion abnormalities. - Left atrium: The atrium was mildly dilated. - Right ventricle: The cavity size was moderately dilated. Systolic function was moderately reduced. - Right atrium: The atrium was moderately dilated. - Pulmonary arteries: Systolic pressure was moderately to severely increased. PA peak pressure: 81m Hg (S).      Assessment & Plan:  COPD (chronic obstructive pulmonary disease) Will continue same BD regimen for now. Consider the possibility that some of her pulm sx are due to absence of prednisone (weaned off for RA). Will consider adding the pred back before committing to a change in her Tyvaso  OSA on CPAP Needs new DME, will [facility] [place] Consider a retitration study in the near future.   Secondary PAH Started on Tyvaso, now titrated up to 9 puffs qid. She feels more dyspnea, not better. Consider the possibility that she is missing prednisone (these med changes overlapped). Another possibility is COPD and regional changes are large contributor to her PAH and now she has more shunt on vasodilator. We may need to stop the Tyvaso. I will discuss with Dr MAundra Dubin consider trial back on pred first.

## 2013-09-26 NOTE — Patient Instructions (Signed)
Please continue your current medications as you are taking them, including Tyvaso for now.  We will discuss the Tyvaso and your symptoms with Dr Aundra Dubin We may decide to restart your prednisone to see if you benefit We will get a wheeled walker We will make a CPAP referral to [facility] Follow with Dr Lamonte Sakai in 1 month

## 2013-09-26 NOTE — Assessment & Plan Note (Signed)
Needs new DME, will refer to [facility] (got her device in [place]). Consider a retitration study in the near future.

## 2013-09-26 NOTE — Assessment & Plan Note (Signed)
Started on Tyvaso, now titrated up to 9 puffs qid. She feels more dyspnea, not better. Consider the possibility that she is missing prednisone (these med changes overlapped). Another possibility is COPD and regional changes are large contributor to her PAH and now she has more shunt on vasodilator. We may need to stop the Tyvaso. I will discuss with Dr Aundra Dubin, consider trial back on pred first.

## 2013-09-26 NOTE — Assessment & Plan Note (Signed)
Will continue same BD regimen for now. Consider the possibility that some of her pulm sx are due to absence of prednisone (weaned off for RA). Will consider adding the pred back before committing to a change in her Tyvaso

## 2013-09-30 ENCOUNTER — Encounter: Payer: Self-pay | Admitting: Cardiology

## 2013-09-30 ENCOUNTER — Ambulatory Visit (INDEPENDENT_AMBULATORY_CARE_PROVIDER_SITE_OTHER): Payer: Medicare Other | Admitting: Cardiology

## 2013-09-30 VITALS — BP 106/56 | HR 100 | Ht 64.0 in | Wt 231.0 lb

## 2013-09-30 DIAGNOSIS — E662 Morbid (severe) obesity with alveolar hypoventilation: Secondary | ICD-10-CM

## 2013-09-30 DIAGNOSIS — I5032 Chronic diastolic (congestive) heart failure: Secondary | ICD-10-CM

## 2013-09-30 DIAGNOSIS — G4733 Obstructive sleep apnea (adult) (pediatric): Secondary | ICD-10-CM

## 2013-09-30 DIAGNOSIS — I2789 Other specified pulmonary heart diseases: Secondary | ICD-10-CM

## 2013-09-30 DIAGNOSIS — J449 Chronic obstructive pulmonary disease, unspecified: Secondary | ICD-10-CM

## 2013-09-30 DIAGNOSIS — I4891 Unspecified atrial fibrillation: Secondary | ICD-10-CM

## 2013-09-30 DIAGNOSIS — I509 Heart failure, unspecified: Secondary | ICD-10-CM

## 2013-09-30 DIAGNOSIS — I279 Pulmonary heart disease, unspecified: Secondary | ICD-10-CM

## 2013-09-30 DIAGNOSIS — I482 Chronic atrial fibrillation, unspecified: Secondary | ICD-10-CM

## 2013-09-30 DIAGNOSIS — I2781 Cor pulmonale (chronic): Secondary | ICD-10-CM

## 2013-09-30 DIAGNOSIS — Z9989 Dependence on other enabling machines and devices: Secondary | ICD-10-CM

## 2013-09-30 MED ORDER — FUROSEMIDE 40 MG PO TABS: 40.0000 mg | ORAL_TABLET | Freq: Two times a day (BID) | ORAL | Status: DC

## 2013-09-30 MED ORDER — POTASSIUM CHLORIDE CRYS ER 20 MEQ PO TBCR: EXTENDED_RELEASE_TABLET | ORAL | Status: DC

## 2013-09-30 NOTE — Patient Instructions (Signed)
Increase lasix (furosemide) to 26m two times a day.   Increase KCL(potassium) to 3 tablets (60 mEq) in the AM and 2 tablets (total 40 mEq) in the PM.  You can take prednisone 129mtoday then 5 mg daily. Call Dr BeAmil Amenor further recommendations about prednisone instructions.   Your physician recommends that you return for a FASTING lipid profile /BMET/BNP in 10 days.   Your physician recommends that you schedule a follow-up appointment in: 3 weeks with Dr McAundra Dubin

## 2013-09-30 NOTE — Progress Notes (Signed)
Patient ID: Tracey Duncan, female   DOB: 1946-07-13, 68 y.o.   MRN: [id number] PCP: Baity  68 yo with history of COPD on home oxygen, OSA (? OHS/OSA), rheumatoid arthritis, and chronic atrial fibrillation presents for cardiology followup.  She was diagnosed with atrial fibrillation around 2004. She failed cardioversion and it has been chronic.  She has noted exertional dyspnea since at least 2012.  She was diagnosed with COPD back in 2002.  She no longer smokes.  She had been on CPAP for OSA prior, but was started on home oxygen all the time by Dr. Lamonte Sakai.  No tachypalpitations, no chest pain.  Echo in 2/14 showed a moderately dilated RV with moderately decreased systolic function, PA systolic pressure 65 mmHg. She was started on apixaban.   V/Q scan was negative for chronic PE.  She has been diagnosed with rheumatoid arthritis and is on [name].  I did a right heart cath in 11/14 that confirmed moderate to severe PAH with PVR 7.4 WU.  In 12/14, I started her on Tyvaso and she also came off prednisone.   Since last appointment, he breathing is worse.  She is short of breath with any exertion.  She is using a walker around the house.  No syncope or lightheadedness.  No chest pain. Lower extremity edema swelling is worse. Weight is up 9 lbs.  The leg aching she had at last appointment resolved after Crestor was stopped and she started on pravastatin.  Labs (4/14): RF < 10, ANA negative, BNP 294, K 3.8, creatinine 1.7  Labs (9/14): K 4.5, creatinine 1.74 Labs (11/14): K 4.5=>3.9, creatinine 1.6, BNP 300  PMH: 1. OSA/OHS: On CPAP at night and oxygen during the day. 2. COPD: Diagnosed 2002.  FEV1 65% predicted in 4/14.  3. HTN 4. Chronic atrial fibrillation: Failed DCCV in 12/04.   5. 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  Echo (2/14) with EF 55-60%, moderately dilated RV with moderately decreased systolic function, PA systolic pressure 65 mmHg.  V/Q scan in 3/14  was negative for chronic PE. PFTs (4/14): FEV1 65% predicted, mild obstructive defect with severely decreased DLCO.  RHC (11/14): mean RA 8, PA 68/28 mean 45, mean PCWP 11, CI 2.29, PVR 7.2 WU.  6. Lexiscan Thallium in 11/11 with no evidence for ischemia or infarction.  7. Impaired fasting glucose. 8. Hypothyroidism 9. Obesity. 10. CKD 11. Arthritis 12. ABIs (11/14): Normal 13. Hyperlipidemia: Myalgias with Crestor.   SH: Widow, moved to [place] from [place] to live with sister.  Quit smoking in 1993.  Rare ETOH. Retired Optometrist.   FH: Brother with MI at 90, father with MI at 32.  ROS: All systems reviewed and negative except as per HPI.   Current Outpatient Prescriptions  Medication Sig Dispense Refill  . albuterol (PROAIR HFA) 108 (90 BASE) MCG/ACT inhaler Inhale 2 puffs into the lungs every 6 (six) hours as needed for wheezing or shortness of breath.       Marland Kitchen albuterol (PROVENTIL) (2.5 MG/3ML) 0.083% nebulizer solution Take 2.5 mg by nebulization 2 (two) times daily as needed for wheezing or shortness of breath.      . Artificial Tear Ointment (ARTIFICIAL TEARS) ointment Place 1 drop into both eyes 2 (two) times daily as needed (dry eyes).      . bisoprolol (ZEBETA) 10 MG tablet Take 10 mg by mouth daily.      . calcium carbonate (TUMS - DOSED IN  MG ELEMENTAL CALCIUM) 500 MG chewable tablet Chew 2 tablets by mouth daily as needed for heartburn.      . calcium-vitamin D (OSCAL WITH D) 500-200 MG-UNIT per tablet Take 1 tablet by mouth daily.      . cetirizine (ZYRTEC) 10 MG tablet Take 10 mg by mouth at bedtime.       . Cholecalciferol (VITAMIN D) 2000 UNITS tablet Take 2,000 Units by mouth daily.      Marland Kitchen ELIQUIS 5 MG TABS tablet TAKE 1 TABLET TWICE A DAY  60 tablet  4  . Fluticasone-Salmeterol (ADVAIR) 250-50 MCG/DOSE AEPB Inhale 1 puff into the lungs every 12 (twelve) hours.      Marland Kitchen leflunomide (ARAVA) 10 MG tablet Take 10 mg by mouth daily.      Marland Kitchen levothyroxine (SYNTHROID, LEVOTHROID)  50 MCG tablet Take 50 mcg by mouth daily before breakfast.      . Magnesium 200 MG TABS 2 tablets (total 450m) daily      . metFORMIN (GLUCOPHAGE) 500 MG tablet TAKE 1 TABLET (500 MG TOTAL) BY MOUTH 2 (TWO) TIMES DAILY WITH A MEAL.  60 tablet  2  . montelukast (SINGULAIR) 10 MG tablet Take 10 mg by mouth at bedtime.      . Multiple Vitamins-Minerals (CENTRUM SILVER PO) Take 1 tablet by mouth daily.      . pravastatin (PRAVACHOL) 20 MG tablet Take 1 tablet (20 mg total) by mouth every evening.  30 tablet  3  . tiotropium (SPIRIVA) 18 MCG inhalation capsule Place 18 mcg into inhaler and inhale daily.      . traMADol (ULTRAM) 50 MG tablet Take 50 mg by mouth every 8 (eight) hours as needed for pain.      . Treprostinil (TYVASO) 0.6 MG/ML SOLN Inhale 18 mcg into the lungs 4 (four) times daily.      .Marland Kitchenzolpidem (AMBIEN) 5 MG tablet Take 1 tablet (5 mg total) by mouth at bedtime as needed for sleep.  15 tablet  0  . furosemide (LASIX) 40 MG tablet Take 1 tablet (40 mg total) by mouth 2 (two) times daily.  60 tablet  3  . potassium chloride SA (K-DUR,KLOR-CON) 20 MEQ tablet 3 tablets (total 60 mEq) in AM and 2 tablets (total 40 mEq) in the PM  150 tablet  3   No current facility-administered medications for this visit.    BP 106/56  Pulse 100  Ht _0  (1.626 m)  Wt 104.781 kg (231 lb)  BMI 39.63 kg/m2 General: NAD, obese.  Neck:JVP 8-9 cm, no thyromegaly or thyroid nodule.  Lungs: Distant breath sounds bilaterally.  CV: Nondisplaced PMI.  Heart irregular S1/S2, no S3/S4, no murmur.  1+ edema 1/3 up lower legs bilaterally.  No carotid bruit.   Abdomen: Soft, nontender, no hepatosplenomegaly, no distention.  Skin: Intact without lesions or rashes.  Neurologic: Alert and oriented x 3.  Psych: Normal affect. Extremities: No clubbing or cyanosis.    Assessment/Plan: 1. Atrial fibrillation: Chronic x years.  She would be unlikely to remain in NSR if cardioversion were attempted.  Plan rate  control.  CHADSVASC = 3 at least.  No history of GI bleeding.  Continue apixaban.  Reasonable rate control with bisoprolol.  2. Pulmonary arterial HTN/cor pulmonale: Significant right heart failure.  Last echo with moderately dilated and dysfunctional RV and PA systolic pressure 65 mmHg, PA systolic pressure 660/10on RHC with normal PCWP.  PAH likely has mixed etiology: I have suspected that  there is a Group I PAH component from rheumatoid arthritis, but patient also has COPD and OHS/OSA.  FEV1 is 65%, so the degree of pulmonary HTN does seem out of proportion to COPD alone.  Given suspected component of Group I PAH, I have started her on Tyvaso (using inhaled agent to target well-ventilated lung segments to avoid worsening of V/Q mismatch with worse hypoxemia that could theoretically occur with use of systemic medication in this patient with parenchymal lung disease from COPD).  Unfortunately, she feels worse since starting Tyvaso.  Possibilities are worsening of V/Q mismatch with the pulmonary vasodilator, worsening of symptoms due to stopping prednisone (stopped prednisone and started Tyvaso right around the same time), and development of pulmonary edema/elevated PCWP with use of pulmonary vasodilator.  - I think we need to try her back on some prednisone to see if this helps her breathing.  I am going to have her start 5 mg daily but will contact Dr. Amil Amen who has been providing her prednisone to see if this is an ideal dose. - I am going to increase her Lasix to 40 mg bid in case she has developed pulmonary edema from use of pulmonary vasodilator.  - I will see her back in 1 month.  If breathing has not improved with these measures, will need to consider stopping Tyvaso.  3. CKD: Creatinine 1.6 when last checked.  BMET in 10 days after increasing the Lasix.   4. RA: Recent diagnosis, being treated (Dr. Amil Amen).  5. CHF: Primarily RV failure.  PCWP normal on recent RHC; however, I have some concern for  possible pulmonary edema/elevated PCWP related to use of pulmonary vasodilators.  Weight is up and she has some volume overload.  Increase Lasix to 40 mg bid and repeat BMET/BNP in 10 days.  Increase KCl to 20 daily.  6. Leg pain: Resolved off Crestor.  So far tolerating pravastatin.  I will have her get lipids/LFTs.   [name] 09/30/2013

## 2013-10-05 ENCOUNTER — Other Ambulatory Visit: Payer: Self-pay | Admitting: Emergency Medicine

## 2013-10-09 ENCOUNTER — Telehealth: Payer: Self-pay | Admitting: Emergency Medicine

## 2013-10-09 NOTE — Telephone Encounter (Signed)
Per OV 09/26/13:   Patient Instructions      Please continue your current medications as you are taking them, including Tyvaso for now.   We will discuss the Tyvaso and your symptoms with Dr Aundra Dubin We may decide to restart your prednisone to see if you benefit We will get a wheeled walker We will make a CPAP referral to [facility] Follow with Dr Lamonte Sakai in 1 month  ---  I called and spoke with Melissa. She reports pt is already aware of this. Nothing further needed

## 2013-10-10 ENCOUNTER — Other Ambulatory Visit (INDEPENDENT_AMBULATORY_CARE_PROVIDER_SITE_OTHER): Payer: Medicare Other

## 2013-10-10 DIAGNOSIS — I5032 Chronic diastolic (congestive) heart failure: Secondary | ICD-10-CM

## 2013-10-10 DIAGNOSIS — I4891 Unspecified atrial fibrillation: Secondary | ICD-10-CM

## 2013-10-10 DIAGNOSIS — I2789 Other specified pulmonary heart diseases: Secondary | ICD-10-CM

## 2013-10-10 LAB — BASIC METABOLIC PANEL
BUN: 22 mg/dL (ref 6–23)
CO2: 30 meq/L (ref 19–32)
Calcium: 9.4 mg/dL (ref 8.4–10.5)
Chloride: 103 mEq/L (ref 96–112)
Creatinine, Ser: 1.6 mg/dL — ABNORMAL HIGH (ref 0.4–1.2)
GFR: 33.73 mL/min — ABNORMAL LOW (ref >60.00)
Glucose, Bld: 105 mg/dL — ABNORMAL HIGH (ref 70–99)
Potassium: 4.3 mEq/L (ref 3.5–5.1)
Sodium: 142 mEq/L (ref 135–145)

## 2013-10-10 LAB — LIPID PANEL
Cholesterol: 145 mg/dL (ref 0–200)
HDL: 48.7 mg/dL (ref >39.00)
LDL CALC: 72 mg/dL (ref 0–99)
TRIGLYCERIDES: 124 mg/dL (ref 0.0–149.0)
Total CHOL/HDL Ratio: 3
VLDL: 24.8 mg/dL (ref 0.0–40.0)

## 2013-10-10 LAB — BRAIN NATRIURETIC PEPTIDE: Pro B Natriuretic peptide (BNP): 383 pg/mL — ABNORMAL HIGH (ref 0.0–100.0)

## 2013-10-14 ENCOUNTER — Other Ambulatory Visit: Payer: Self-pay | Admitting: *Deleted

## 2013-10-15 ENCOUNTER — Ambulatory Visit (INDEPENDENT_AMBULATORY_CARE_PROVIDER_SITE_OTHER): Payer: Medicare Other | Admitting: Cardiology

## 2013-10-15 ENCOUNTER — Encounter: Payer: Self-pay | Admitting: Cardiology

## 2013-10-15 VITALS — BP 124/56 | HR 62 | Ht 64.0 in | Wt 224.0 lb

## 2013-10-15 DIAGNOSIS — I279 Pulmonary heart disease, unspecified: Secondary | ICD-10-CM

## 2013-10-15 DIAGNOSIS — J449 Chronic obstructive pulmonary disease, unspecified: Secondary | ICD-10-CM

## 2013-10-15 DIAGNOSIS — I482 Chronic atrial fibrillation, unspecified: Secondary | ICD-10-CM

## 2013-10-15 DIAGNOSIS — I2789 Other specified pulmonary heart diseases: Secondary | ICD-10-CM

## 2013-10-15 DIAGNOSIS — I5022 Chronic systolic (congestive) heart failure: Secondary | ICD-10-CM

## 2013-10-15 DIAGNOSIS — I509 Heart failure, unspecified: Secondary | ICD-10-CM

## 2013-10-15 DIAGNOSIS — I5032 Chronic diastolic (congestive) heart failure: Secondary | ICD-10-CM

## 2013-10-15 DIAGNOSIS — I2781 Cor pulmonale (chronic): Secondary | ICD-10-CM

## 2013-10-15 DIAGNOSIS — J4489 Other specified chronic obstructive pulmonary disease: Secondary | ICD-10-CM

## 2013-10-15 DIAGNOSIS — I2721 Secondary pulmonary arterial hypertension: Secondary | ICD-10-CM

## 2013-10-15 DIAGNOSIS — I4891 Unspecified atrial fibrillation: Secondary | ICD-10-CM

## 2013-10-15 MED ORDER — FUROSEMIDE 40 MG PO TABS
ORAL_TABLET | ORAL | Status: DC
Start: 2013-10-15 — End: 2014-01-24

## 2013-10-15 NOTE — Patient Instructions (Signed)
Increase lasix (furosemide) to 82m two times a day. This will be 1 and 1/2 of a 420mtablet two times a day.   Your physician recommends that you return for lab work in: about 10 days--BMET/BNP.  Your physician recommends that you schedule a follow-up appointment in: 1 month with Dr McAundra Dubin

## 2013-10-16 NOTE — Progress Notes (Signed)
Patient ID: Tracey Duncan, female   DOB: Jan 25, 1946, 68 y.o.   MRN: [id number] PCP: Baity  67 yo with history of COPD on home oxygen, OSA (? OHS/OSA), rheumatoid arthritis, and chronic atrial fibrillation presents for cardiology followup.  She was diagnosed with atrial fibrillation around 2004. She failed cardioversion and it has been chronic.  She has noted exertional dyspnea since at least 2012.  She was diagnosed with COPD back in 2002.  She no longer smokes.  She had been on CPAP for OSA prior, but was started on home oxygen all the time by Dr. Lamonte Sakai.  No tachypalpitations, no chest pain.  Echo in 2/14 showed a moderately dilated RV with moderately decreased systolic function, PA systolic pressure 65 mmHg. She was started on apixaban.   V/Q scan was negative for chronic PE.  She has been diagnosed with rheumatoid arthritis and is on [name].  I did a right heart cath in 11/14 that confirmed moderate to severe PAH with PVR 7.4 WU.  In 12/14, I started her on Tyvaso and she also came off prednisone.   At last appointment, breathing was worse (after starting Tyvaso).  She was short of breath with any exertion.  She was using a walker around the house.  No syncope or lightheadedness.  No chest pain. She had gained weight with more lower extremity edema.  I increased her Lasix to 40 mg bid and she started back on prednisone 5 mg daily.  She says that her breathing may be marginally better now, but not much.  Weight is down 7 lbs.  6 minute walk today went 126 meters.    Labs (4/14): RF < 10, ANA negative, BNP 294, K 3.8, creatinine 1.7  Labs (9/14): K 4.5, creatinine 1.74 Labs (11/14): K 4.5=>3.9, creatinine 1.6, BNP 300 Labs (2/15): K 4.3, creatinine 1.6, BNP 383, LDL 72, HDL 49  PMH: 1. OSA/OHS: On CPAP at night and oxygen during the day. 2. COPD: Diagnosed 2002.  FEV1 65% predicted in 4/14.  3. HTN 4. Chronic atrial fibrillation: Failed DCCV in 12/04.   5. 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  Echo (2/14) with EF 55-60%, moderately dilated RV with moderately decreased systolic function, PA systolic pressure 65 mmHg.  V/Q scan in 3/14 was negative for chronic PE. PFTs (4/14): FEV1 65% predicted, mild obstructive defect with severely decreased DLCO.  RHC (11/14): mean RA 8, PA 68/28 mean 45, mean PCWP 11, CI 2.29, PVR 7.2 WU. 6 minute walk (2/15) 126 meters.  6. Lexiscan Thallium in 11/11 with no evidence for ischemia or infarction.  7. Impaired fasting glucose. 8. Hypothyroidism 9. Obesity. 10. CKD 11. Arthritis 12. ABIs (11/14): Normal 13. Hyperlipidemia: Myalgias with Crestor.   SH: Widow, moved to [place] from [place] to live with sister.  Quit smoking in 1993.  Rare ETOH. Retired Optometrist.   FH: Brother with MI at 48, father with MI at 14.  ROS: All systems reviewed and negative except as per HPI.   Current Outpatient Prescriptions  Medication Sig Dispense Refill  . ADVAIR DISKUS 250-50 MCG/DOSE AEPB INHALE 1 PUFF INTO THE LUNGS EVERY 12 (TWELVE) HOURS.  60 each  5  . albuterol (PROAIR HFA) 108 (90 BASE) MCG/ACT inhaler Inhale 2 puffs into the lungs every 6 (six) hours as needed for wheezing or shortness of breath.       . Artificial Tear Ointment (ARTIFICIAL TEARS) ointment Place 1 drop into  both eyes 2 (two) times daily as needed (dry eyes).      . bisoprolol (ZEBETA) 10 MG tablet Take 10 mg by mouth daily.      . calcium carbonate (TUMS - DOSED IN MG ELEMENTAL CALCIUM) 500 MG chewable tablet Chew 2 tablets by mouth daily as needed for heartburn.      . calcium-vitamin D (OSCAL WITH D) 500-200 MG-UNIT per tablet Take 1 tablet by mouth daily.      . cetirizine (ZYRTEC) 10 MG tablet Take 10 mg by mouth at bedtime.       . Cholecalciferol (VITAMIN D) 2000 UNITS tablet Take 2,000 Units by mouth daily.      Marland Kitchen ELIQUIS 5 MG TABS tablet TAKE 1 TABLET TWICE A DAY  60 tablet  4  . Fluticasone-Salmeterol (ADVAIR) 250-50 MCG/DOSE AEPB Inhale 1  puff into the lungs every 12 (twelve) hours.      Marland Kitchen leflunomide (ARAVA) 10 MG tablet Take 10 mg by mouth daily.      Marland Kitchen levothyroxine (SYNTHROID, LEVOTHROID) 50 MCG tablet Take 50 mcg by mouth daily before breakfast.      . Magnesium 200 MG TABS 2 tablets (total 476m) daily      . metFORMIN (GLUCOPHAGE) 500 MG tablet TAKE 1 TABLET (500 MG TOTAL) BY MOUTH 2 (TWO) TIMES DAILY WITH A MEAL.  60 tablet  2  . montelukast (SINGULAIR) 10 MG tablet Take 10 mg by mouth at bedtime.      . Multiple Vitamins-Minerals (CENTRUM SILVER PO) Take 1 tablet by mouth daily.      . potassium chloride SA (K-DUR,KLOR-CON) 20 MEQ tablet 3 tablets (total 60 mEq) in AM and 2 tablets (total 40 mEq) in the PM  150 tablet  3  . pravastatin (PRAVACHOL) 20 MG tablet Take 1 tablet (20 mg total) by mouth every evening.  30 tablet  3  . tiotropium (SPIRIVA) 18 MCG inhalation capsule Place 18 mcg into inhaler and inhale daily.      . traMADol (ULTRAM) 50 MG tablet Take 50 mg by mouth every 8 (eight) hours as needed for pain.      . Treprostinil (TYVASO) 0.6 MG/ML SOLN Inhale 18 mcg into the lungs 4 (four) times daily.      .Marland Kitchenzolpidem (AMBIEN) 5 MG tablet Take 1 tablet (5 mg total) by mouth at bedtime as needed for sleep.  15 tablet  0  . furosemide (LASIX) 40 MG tablet 1 and 1/2 tablets(total 622m two times a day  90 tablet  3   No current facility-administered medications for this visit.    BP 124/56  Pulse 62  Ht _0  (1.626 m)  Wt 101.606 kg (224 lb)  BMI 38.43 kg/m2 General: NAD, obese.  Neck:JVP 8-9 cm, no thyromegaly or thyroid nodule.  Lungs: Distant breath sounds bilaterally wth crackles left base.  CV: Nondisplaced PMI.  Heart irregular S1/S2, no S3/S4, no murmur.  Trace ankle edema bilaterally.  No carotid bruit.   Abdomen: Soft, nontender, no hepatosplenomegaly, no distention.  Skin: Intact without lesions or rashes.  Neurologic: Alert and oriented x 3.  Psych: Normal affect. Extremities: No clubbing or  cyanosis.    Assessment/Plan: 1. Atrial fibrillation: Chronic x years.  She would be unlikely to remain in NSR if cardioversion were attempted.  Plan rate control.  CHADSVASC = 3 at least.  No history of GI bleeding.  Continue apixaban.  Reasonable rate control with bisoprolol.  2. Pulmonary arterial HTN/cor pulmonale: Significant  right heart failure.  Last echo with moderately dilated and dysfunctional RV and PA systolic pressure 65 mmHg, PA systolic pressure 16/60 on RHC with normal PCWP.  PAH likely has mixed etiology: I have suspected that there is a Group I PAH component from rheumatoid arthritis, but patient also has COPD and OHS/OSA.  FEV1 is 65%, so the degree of pulmonary HTN does seem out of proportion to COPD alone.  Given suspected component of Group I PAH, I have started her on Tyvaso (using inhaled agent to target well-ventilated lung segments to avoid worsening of V/Q mismatch with worse hypoxemia that could theoretically occur with use of systemic medication in this patient with parenchymal lung disease from COPD).  Unfortunately, she feels worse since starting Tyvaso.  Possibilities are worsening of V/Q mismatch with the pulmonary vasodilator, worsening of symptoms due to stopping prednisone (stopped prednisone and started Tyvaso right around the same time), and development of pulmonary edema/elevated PCWP with use of pulmonary vasodilator.  After last appointment, she was restarted on prednisone and Lasix was increased, with some marginal improvement.  - She still has some volume overload though weight is down.  Will try to push Lasix a bit, increase to 60 mg bid with BMET/BNP in 10 days.   - I would like her to continue Tyvaso at the full dose for one more month.  If no improvement, would repeat 6 minute walk and consider repeat RHC to see if any effect and potentially stopping the medication.  3. CKD: Creatinine 1.6 when last checked.  BMET in 10 days after increasing the Lasix.   4. RA:  Recent diagnosis, being treated (Dr. Amil Amen).  5. CHF: Primarily RV failure.  PCWP normal on recent RHC; however, I have some concern for possible pulmonary edema/elevated PCWP related to use of pulmonary vasodilators.  As above, increasing Lasix again to 60 mg bid.   6. Leg pain: Resolved off Crestor.  So far tolerating pravastatin.  Good lipids 2/15.   [name] 10/16/2013

## 2013-10-22 ENCOUNTER — Other Ambulatory Visit: Payer: Self-pay | Admitting: Internal Medicine

## 2013-10-22 ENCOUNTER — Other Ambulatory Visit: Payer: Self-pay | Admitting: Emergency Medicine

## 2013-10-23 ENCOUNTER — Other Ambulatory Visit: Payer: Self-pay | Admitting: Internal Medicine

## 2013-10-25 ENCOUNTER — Other Ambulatory Visit (INDEPENDENT_AMBULATORY_CARE_PROVIDER_SITE_OTHER): Payer: Medicare Other

## 2013-10-25 DIAGNOSIS — I5022 Chronic systolic (congestive) heart failure: Secondary | ICD-10-CM

## 2013-10-25 DIAGNOSIS — I2789 Other specified pulmonary heart diseases: Secondary | ICD-10-CM

## 2013-10-25 DIAGNOSIS — I5032 Chronic diastolic (congestive) heart failure: Secondary | ICD-10-CM

## 2013-10-25 LAB — HEPATIC FUNCTION PANEL
ALT: 11 U/L (ref 0–35)
AST: 17 U/L (ref 0–37)
Albumin: 3.4 g/dL — ABNORMAL LOW (ref 3.5–5.2)
Alkaline Phosphatase: 60 U/L (ref 39–117)
BILIRUBIN DIRECT: 0.1 mg/dL (ref 0.0–0.3)
BILIRUBIN TOTAL: 0.8 mg/dL (ref 0.3–1.2)
Total Protein: 6.6 g/dL (ref 6.0–8.3)

## 2013-10-25 LAB — LIPID PANEL
CHOL/HDL RATIO: 3
Cholesterol: 150 mg/dL (ref 0–200)
HDL: 52.8 mg/dL (ref >39.00)
LDL CALC: 74 mg/dL (ref 0–99)
Triglycerides: 118 mg/dL (ref 0.0–149.0)
VLDL: 23.6 mg/dL (ref 0.0–40.0)

## 2013-10-25 LAB — BASIC METABOLIC PANEL
BUN: 20 mg/dL (ref 6–23)
CALCIUM: 9.5 mg/dL (ref 8.4–10.5)
CO2: 31 meq/L (ref 19–32)
CREATININE: 1.5 mg/dL — AB (ref 0.4–1.2)
Chloride: 100 mEq/L (ref 96–112)
GFR: 37.73 mL/min — ABNORMAL LOW (ref >60.00)
Glucose, Bld: 129 mg/dL — ABNORMAL HIGH (ref 70–99)
Potassium: 4 mEq/L (ref 3.5–5.1)
Sodium: 140 mEq/L (ref 135–145)

## 2013-10-25 LAB — BRAIN NATRIURETIC PEPTIDE: Pro B Natriuretic peptide (BNP): 211 pg/mL — ABNORMAL HIGH (ref 0.0–100.0)

## 2013-10-29 ENCOUNTER — Ambulatory Visit: Payer: Medicare Other | Admitting: Emergency Medicine

## 2013-11-02 ENCOUNTER — Other Ambulatory Visit: Payer: Self-pay | Admitting: Internal Medicine

## 2013-11-02 ENCOUNTER — Other Ambulatory Visit: Payer: Self-pay | Admitting: Emergency Medicine

## 2013-11-09 ENCOUNTER — Other Ambulatory Visit: Payer: Self-pay | Admitting: Internal Medicine

## 2013-11-13 ENCOUNTER — Encounter: Payer: Self-pay | Admitting: Emergency Medicine

## 2013-11-13 ENCOUNTER — Ambulatory Visit (INDEPENDENT_AMBULATORY_CARE_PROVIDER_SITE_OTHER): Payer: Medicare Other | Admitting: Emergency Medicine

## 2013-11-13 VITALS — BP 120/78 | HR 64 | Ht 64.0 in | Wt 213.0 lb

## 2013-11-13 DIAGNOSIS — Z9989 Dependence on other enabling machines and devices: Secondary | ICD-10-CM

## 2013-11-13 DIAGNOSIS — G4733 Obstructive sleep apnea (adult) (pediatric): Secondary | ICD-10-CM

## 2013-11-13 DIAGNOSIS — I2781 Cor pulmonale (chronic): Secondary | ICD-10-CM

## 2013-11-13 DIAGNOSIS — I279 Pulmonary heart disease, unspecified: Secondary | ICD-10-CM

## 2013-11-13 DIAGNOSIS — J449 Chronic obstructive pulmonary disease, unspecified: Secondary | ICD-10-CM

## 2013-11-13 DIAGNOSIS — I2789 Other specified pulmonary heart diseases: Secondary | ICD-10-CM

## 2013-11-13 DIAGNOSIS — R0902 Hypoxemia: Secondary | ICD-10-CM

## 2013-11-13 DIAGNOSIS — J019 Acute sinusitis, unspecified: Secondary | ICD-10-CM

## 2013-11-13 MED ORDER — AMOXICILLIN-POT CLAVULANATE 875-125 MG PO TABS: 1.0000 | ORAL_TABLET | Freq: Two times a day (BID) | ORAL | Status: AC

## 2013-11-13 NOTE — Assessment & Plan Note (Addendum)
-  continue current BD's - she wants to stay on Pred 5 right now even though Rheum has cleared her to stop it. We will do so for now, consider a slow taper at some point in the future.

## 2013-11-13 NOTE — Assessment & Plan Note (Signed)
-  diuresis as ordered by Dr Aundra Dubin

## 2013-11-13 NOTE — Progress Notes (Signed)
Subjective:  Patient ID: Tracey Duncan, female    DOB: [birth year], 68 y.o.   MRN: [id number] HPI 68 yo woman, former tobacco (20 pk-yrs), hx of OSA dx in 4/13 (on CPAP + O2), HTN, A fib (failed cardioversion), allergies. COPD dx in 2002, currently on Advair + Spiriva. Albuterol prn, uses it only . Since moving to [place] in last 6 months she has had more SOB. She is having occasional wheeze, no cough. No CP.  Significant hypoxemia on arrival. Needs a local PCP and cardiologist  ROV 10/30/12 -- COPD, OSA, allergies, A Fib, HTN. She was seen by Dr Aundra Dubin and started anticoagulation for her A fib. Also had TTE that showed PAH with PASP ~ 24mHg. She is wearing her O2 reliably - has a documented desat on O2 at Dr MClaris Gladdenoffice. She is ordered for 6L and 8L with exertion.  Currently on advair, spiriva. Singulair + zyrtec.   ROV 12/05/12 -- COPD, OSA, allergies, A Fib, HTN. Also secondary PAH and severe hypoxemia.  PFT today with moderately severe AFL, no BD response, decreased DLCO. A V/Q on 11/21/12 was normal. Her lasix has been adjusted by Dr MAundra Dubin She is using her oximizer reliably. She is trying to wear CPAP reliably, but still poor sleep - difficulty falling asleep and also staying asleep. Supposed to have ONO on CPAP 4/3.  ROV 01/16/13 --  COPD (FEV1 1.38L), OSA on CPAP, allergies, A Fib, HTN. Also secondary PAH and severe hypoxemia.  Returns for f/u. Reports that her wt has dropped another 8 lbs since our last visit,  breathing well. ONO on CPAP + 3L/min performed > shows 3 discrete episodes of desat that could represent REM-related desats (the report says 2, but it was really 3L/min). Unfortunately about a week ago she developed R UE, hand pain, L knee pain. No f/c, no medication changes or other infectious sx. She follows w [facility].   PULMONARY FUNCTON TEST 12/05/2012  FVC 2.15  FEV1 1.38  FEV1/FVC 64.2  FVC  % Predicted 73  FEV % Predicted 65  FeF 25-75 .65  FeF 25-75 % Predicted 2.44       05/29/13 -- COPD, OSA, newly dx RA. Secondary PAH and hypoxemia. She was started on prednisone, now weaning and being converted to [place] She feels MUCH better, is more active. She is compliant with her oximizer. She wears her CPAP. Compliance data shows that she wears it, she states that it helps her clinically.  She is having more nasal drainage, cough with clear sputum.   09/26/13 -- COPD, OSA, newly dx RA. Secondary PAH and hypoxemia. She tells me that her breathing has been worse over the last several months. R heart cath (11/14): mean RA 8, PA 68/28 mean 45, mean PCWP 11, CI 2.29, PVR 7.2 WU. She was started on Tyvaso by Dr MAundra Dubin has titrated up to 9 puffs qid. She unfortunately feels worse not better, is now limited as to her activity, cannot walk any significant distance (a clear change). She also has now weaned off of prednisone and is on [place] which could be an influence here. She is on Advair and Spiriva.   ROV 11/13/13 -- COPD, OSA, RA, A Fib, secondary PAH and hypoxemia. She has been on Tyvaso since 12/17, 9 puffs since January. She is also on Arava and Pred 5. She feels that the last several months have been characterized by worsening dyspnea, some worsening edema > her lasix has been increased by Dr MAundra Dubinwith  some improvement in edema. Her rheumatolgist believes that she could be off pred from their standpoint. She remains on Spiriva + Advair. She remains on CPAP - but hasn't used it last several weeks due to cough. She has not had repeat R heart cath or TTE on Tyvaso yet. She is getting purulent mucous from her nose, resulting in cough. She is on zyrtec and sigulair.     Objective:   Physical Exam Filed Vitals:   11/13/13 1342  BP: 120/78  Pulse: 64  Height: _0  (1.626 m)  Weight: 213 lb (96.616 kg)  SpO2: 92%   Gen: Pleasant, obese, in no distress,  normal affect  ENT: No lesions,  mouth clear,  oropharynx clear, no postnasal drip  Neck: No JVD, no TMG, no carotid  bruits  Lungs: No use of accessory muscles, clear without rales or rhonchi  Cardiovascular: RRR, heart sounds normal, no murmur or gallops, 1+ peripheral edema  Musculoskeletal: No deformities, no cyanosis or clubbing  Neuro: alert, non focal  Skin: Warm, no lesions or rashes   R heart cath 07/11/13  -- RA mean 8  RV 70/7  PA 68/28, mean 45  PCWP mean 11  Oxygen saturations (on 5L home oxygen):  PA 64%  AO 99%  Cardiac Output (Fick) 4.74  Cardiac Index (Fick) 2.29  PVR 7.2 WU   10/22/12 --  Study Conclusions - Left ventricle: The cavity size was normal. Wall thickness was normal. Systolic function was normal. The estimated ejection fraction was in the range of 55% to 60%. Wall motion was normal; there were no regional wall motion abnormalities. - Left atrium: The atrium was mildly dilated. - Right ventricle: The cavity size was moderately dilated. Systolic function was moderately reduced. - Right atrium: The atrium was moderately dilated. - Pulmonary arteries: Systolic pressure was moderately to severely increased. PA peak pressure: 69m Hg (S).      Assessment & Plan:  Acute sinusitis Purulent nasal discharge w HA, cough - treat w Augmentin x 14 days and follow  OSA on CPAP Need to repeat CPAP asap. I have encouraged her to do so when her cough improves  COPD (chronic obstructive pulmonary disease) - continue current BD's - she wants to stay on Pred 5 right now even though Rheum has cleared her to stop it. We will do so for now, consider a slow taper at some point in the future.    Hypoxemia - asked her to increase her o2 to 8L/min via oximizer at all times.   Secondary PAH Discussed tyvaso and causes for PAH with her in detail. Her understanding of all the contributors is poor. She believes it makes her feel worse. I have encouraged her to continue the Tyvaso for a few more months after which we can recheck PAP's and her 6 minute walk. She may not respond -  I explained this to her.  - same Tyvaso - repeat R heart cath in about 2 months - repeat 6 minute walk in a few months  Cor pulmonale - diuresis as ordered by Dr MAundra Dubin

## 2013-11-13 NOTE — Patient Instructions (Signed)
Please continue your Tyvaso We will coordinate a repeat R heart catherization and 6 minute walk with Dr Aundra Dubin Continue prednisone, Spiriva and Advair Increase your oxygen to 8L/min via oximizer. Our goal is to keep your SpO2 > 90% as much as possible Restart your CPAP when your cough resolves Take Augmentin for 14 days as directed Follow with Dr Lamonte Sakai in 3 months or sooner if you have any problems.

## 2013-11-13 NOTE — Assessment & Plan Note (Signed)
Purulent nasal discharge w HA, cough - treat w Augmentin x 14 days and follow

## 2013-11-13 NOTE — Assessment & Plan Note (Signed)
-  asked her to increase her o2 to 8L/min via oximizer at all times.

## 2013-11-13 NOTE — Assessment & Plan Note (Signed)
Discussed tyvaso and causes for PAH with her in detail. Her understanding of all the contributors is poor. She believes it makes her feel worse. I have encouraged her to continue the Tyvaso for a few more months after which we can recheck PAP's and her 6 minute walk. She may not respond - I explained this to her.  - same Tyvaso - repeat R heart cath in about 2 months - repeat 6 minute walk in a few months

## 2013-11-13 NOTE — Assessment & Plan Note (Signed)
Need to repeat CPAP asap. I have encouraged her to do so when her cough improves

## 2013-11-18 ENCOUNTER — Ambulatory Visit (INDEPENDENT_AMBULATORY_CARE_PROVIDER_SITE_OTHER): Payer: Medicare Other | Admitting: Cardiology

## 2013-11-18 ENCOUNTER — Encounter: Payer: Self-pay | Admitting: Cardiology

## 2013-11-18 VITALS — BP 118/62 | HR 84 | Ht 64.0 in | Wt 211.0 lb

## 2013-11-18 DIAGNOSIS — E662 Morbid (severe) obesity with alveolar hypoventilation: Secondary | ICD-10-CM

## 2013-11-18 DIAGNOSIS — I509 Heart failure, unspecified: Secondary | ICD-10-CM

## 2013-11-18 DIAGNOSIS — I482 Chronic atrial fibrillation, unspecified: Secondary | ICD-10-CM

## 2013-11-18 DIAGNOSIS — I5022 Chronic systolic (congestive) heart failure: Secondary | ICD-10-CM

## 2013-11-18 DIAGNOSIS — I5032 Chronic diastolic (congestive) heart failure: Secondary | ICD-10-CM

## 2013-11-18 DIAGNOSIS — R0989 Other specified symptoms and signs involving the circulatory and respiratory systems: Secondary | ICD-10-CM

## 2013-11-18 DIAGNOSIS — R0609 Other forms of dyspnea: Secondary | ICD-10-CM

## 2013-11-18 DIAGNOSIS — I2789 Other specified pulmonary heart diseases: Secondary | ICD-10-CM

## 2013-11-18 DIAGNOSIS — I4891 Unspecified atrial fibrillation: Secondary | ICD-10-CM

## 2013-11-18 DIAGNOSIS — J449 Chronic obstructive pulmonary disease, unspecified: Secondary | ICD-10-CM

## 2013-11-18 DIAGNOSIS — I2781 Cor pulmonale (chronic): Secondary | ICD-10-CM

## 2013-11-18 DIAGNOSIS — I2721 Secondary pulmonary arterial hypertension: Secondary | ICD-10-CM

## 2013-11-18 DIAGNOSIS — I279 Pulmonary heart disease, unspecified: Secondary | ICD-10-CM

## 2013-11-18 LAB — BASIC METABOLIC PANEL
BUN: 18 mg/dL (ref 6–23)
CHLORIDE: 101 meq/L (ref 96–112)
CO2: 30 mEq/L (ref 19–32)
CREATININE: 1.5 mg/dL — AB (ref 0.4–1.2)
Calcium: 9.5 mg/dL (ref 8.4–10.5)
GFR: 38.02 mL/min — ABNORMAL LOW (ref >60.00)
GLUCOSE: 140 mg/dL — AB (ref 70–99)
POTASSIUM: 4.2 meq/L (ref 3.5–5.1)
Sodium: 140 mEq/L (ref 135–145)

## 2013-11-18 LAB — BRAIN NATRIURETIC PEPTIDE: Pro B Natriuretic peptide (BNP): 256 pg/mL — ABNORMAL HIGH (ref 0.0–100.0)

## 2013-11-18 MED ORDER — SERTRALINE HCL 50 MG PO TABS
50.0000 mg | ORAL_TABLET | Freq: Every day | ORAL | Status: DC
Start: 2013-11-18 — End: 2014-01-24

## 2013-11-18 NOTE — Patient Instructions (Addendum)
Your physician recommends that you have  lab work today--BMET/BNP.  Your physician recommends that you schedule a follow-up appointment in: 2 months with Dr Aundra Dubin.   Sertraline 50m daily sent to [facility].

## 2013-11-18 NOTE — Progress Notes (Signed)
Patient ID: Tracey Duncan, female   DOB: 1946-07-20, 68 y.o.   MRN: [id number] PCP: Baity  68 yo with history of COPD on home oxygen, OSA (? OHS/OSA), rheumatoid arthritis, and chronic atrial fibrillation presents for cardiology followup.  She was diagnosed with atrial fibrillation around 2004. She failed cardioversion and it has been chronic.  She has noted exertional dyspnea since at least 2012.  She was diagnosed with COPD back in 2002.  She no longer smokes.  She had been on CPAP for OSA prior, but was started on home oxygen all the time by Dr. Lamonte Sakai.  No tachypalpitations, no chest pain.  Echo in 2/14 showed a moderately dilated RV with moderately decreased systolic function, PA systolic pressure 65 mmHg. She was started on apixaban.   V/Q scan was negative for chronic PE.  She has been diagnosed with rheumatoid arthritis and is on [name].  I did a right heart cath in 11/14 that confirmed moderate to severe PAH with PVR 7.4 WU.  In 12/14, I started her on Tyvaso and she also came off prednisone.   Over the last few months, her breathing has seemed to be worse (after starting Tyvaso).  She has been short of breath walking around her house.  This has been stable.  No syncope or lightheadedness.  No chest pain. At last appointment, I increased her Lasix to 60 mg bid, and she has lost 13 lbs.  She really has not noticed much difference in her symptoms.  6 minute walk was done today, she went 118 meters.  She additionally, of note, has had sinusitis for the last week. Her mood has been very depressed recently, and she has lost interest in her usual activities.   Labs (4/14): RF < 10, ANA negative, BNP 294, K 3.8, creatinine 1.7  Labs (9/14): K 4.5, creatinine 1.74 Labs (11/14): K 4.5=>3.9, creatinine 1.6, BNP 300 Labs (2/15): K 4.3, creatinine 1.6=>1.5, BNP 383=>311, LDL 72, HDL 49  PMH: 1. OSA/OHS: On CPAP at night and oxygen during the day. 2. COPD: Diagnosed 2002.  FEV1 65% predicted in 4/14.  3.  HTN 4. Chronic atrial fibrillation: Failed DCCV in 12/04.   5. 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  Echo (2/14) with EF 55-60%, moderately dilated RV with moderately decreased systolic function, PA systolic pressure 65 mmHg.  V/Q scan in 3/14 was negative for chronic PE. PFTs (4/14): FEV1 65% predicted, mild obstructive defect with severely decreased DLCO.  RHC (11/14): mean RA 8, PA 68/28 mean 45, mean PCWP 11, CI 2.29, PVR 7.2 WU. 6 minute walk (2/15) 126 meters.  6 minute walk (315) 118 m.   6. Lexiscan Thallium in 11/11 with no evidence for ischemia or infarction.  7. Impaired fasting glucose. 8. Hypothyroidism 9. Obesity. 10. CKD 11. Rheumatoid arthritis 12. ABIs (11/14): Normal 13. Hyperlipidemia: Myalgias with Crestor.  20. Dperession  SH: Widow, moved to [place] from [place] to live with sister.  Quit smoking in 1993.  Rare ETOH. Retired Optometrist.   FH: Brother with MI at 73, father with MI at 15.  ROS: All systems reviewed and negative except as per HPI.   Current Outpatient Prescriptions  Medication Sig Dispense Refill  . ADVAIR DISKUS 250-50 MCG/DOSE AEPB INHALE 1 PUFF INTO THE LUNGS EVERY 12 (TWELVE) HOURS.  60 each  5  . albuterol (PROAIR HFA) 108 (90 BASE) MCG/ACT inhaler Inhale 2 puffs into the lungs every 6 (six)  hours as needed for wheezing or shortness of breath.       Marland Kitchen amoxicillin-clavulanate (AUGMENTIN) 875-125 MG per tablet Take 1 tablet by mouth 2 (two) times daily.  28 tablet  0  . Artificial Tear Ointment (ARTIFICIAL TEARS) ointment Place 1 drop into both eyes 2 (two) times daily as needed (dry eyes).      . bisoprolol (ZEBETA) 10 MG tablet Take 10 mg by mouth daily.      . calcium carbonate (TUMS - DOSED IN MG ELEMENTAL CALCIUM) 500 MG chewable tablet Chew 2 tablets by mouth daily as needed for heartburn.      . calcium-vitamin D (OSCAL WITH D) 500-200 MG-UNIT per tablet Take 1 tablet by mouth daily.      .  cetirizine (ZYRTEC) 10 MG tablet Take 10 mg by mouth at bedtime.       . Cholecalciferol (VITAMIN D) 2000 UNITS tablet Take 2,000 Units by mouth daily.      Marland Kitchen ELIQUIS 5 MG TABS tablet TAKE 1 TABLET TWICE A DAY  60 tablet  4  . Fluticasone-Salmeterol (ADVAIR) 250-50 MCG/DOSE AEPB Inhale 1 puff into the lungs every 12 (twelve) hours.      . furosemide (LASIX) 40 MG tablet 1 and 1/2 tablets(total 80m) two times a day  90 tablet  3  . leflunomide (ARAVA) 10 MG tablet Take 10 mg by mouth daily.      .Marland Kitchenlevothyroxine (SYNTHROID, LEVOTHROID) 50 MCG tablet Take 50 mcg by mouth daily before breakfast.      . Magnesium 200 MG TABS 2 tablets (total 402m daily      . metFORMIN (GLUCOPHAGE) 500 MG tablet Take 1 tablet (500 mg total) by mouth 2 (two) times daily with a meal.  60 tablet  2  . montelukast (SINGULAIR) 10 MG tablet Take 10 mg by mouth at bedtime.      . Multiple Vitamins-Minerals (CENTRUM SILVER PO) Take 1 tablet by mouth daily.      . potassium chloride SA (K-DUR,KLOR-CON) 20 MEQ tablet 3 tablets (total 60 mEq) in AM and 2 tablets (total 40 mEq) in the PM  150 tablet  3  . pravastatin (PRAVACHOL) 20 MG tablet Take 1 tablet (20 mg total) by mouth every evening.  30 tablet  3  . prednisoLONE 5 MG TABS tablet Take 5 mg by mouth daily.      . Marland KitchenPIRIVA HANDIHALER 18 MCG inhalation capsule INHALE 1 CAPSULE VIA HANDIHALER ONCE DAILY AT THE SAME TIME EVERY DAY  30 capsule  3  . tiotropium (SPIRIVA) 18 MCG inhalation capsule Place 18 mcg into inhaler and inhale daily.      . traMADol (ULTRAM) 50 MG tablet Take 50 mg by mouth every 8 (eight) hours as needed for pain.      . Treprostinil (TYVASO) 0.6 MG/ML SOLN Inhale 18 mcg into the lungs 4 (four) times daily.      . Marland Kitchenolpidem (AMBIEN) 5 MG tablet Take 1 tablet (5 mg total) by mouth at bedtime as needed for sleep.  15 tablet  0  . sertraline (ZOLOFT) 50 MG tablet Take 1 tablet (50 mg total) by mouth daily.  30 tablet  2   No current facility-administered  medications for this visit.    BP 118/62  Pulse 84  Ht _0  (1.626 m)  Wt 95.709 kg (211 lb)  BMI 36.20 kg/m2 General: NAD, obese.  Neck:JVP 7 cm, no thyromegaly or thyroid nodule.  Lungs: Distant breath sounds bilaterally  wth crackles left base.  CV: Nondisplaced PMI.  Heart irregular S1/S2, no S3/S4, no murmur.  No edema.  No carotid bruit.   Abdomen: Soft, nontender, no hepatosplenomegaly, no distention.  Skin: Intact without lesions or rashes.  Neurologic: Alert and oriented x 3.  Psych: Normal affect. Extremities: No clubbing or cyanosis.    Assessment/Plan: 1. Atrial fibrillation: Chronic x years.  She would be unlikely to remain in NSR if cardioversion were attempted.  Plan rate control.  CHADSVASC = 3 at least.  No history of GI bleeding.  Continue apixaban.  Reasonable rate control with bisoprolol.  2. Pulmonary arterial HTN/cor pulmonale: Significant right heart failure.  Last echo with moderately dilated and dysfunctional RV and PA systolic pressure 65 mmHg, PA systolic pressure 43/15 on RHC with normal PCWP.  PAH likely has mixed etiology: I have suspected that there is a Group I PAH component from rheumatoid arthritis, but patient also has COPD and OHS/OSA.  FEV1 is 65%, so the degree of pulmonary HTN does seem out of proportion to COPD alone.  Given suspected component of Group I PAH, I have started her on Tyvaso (using inhaled agent to target well-ventilated lung segments to avoid worsening of V/Q mismatch with worse hypoxemia that could theoretically occur with use of systemic medication in this patient with parenchymal lung disease from COPD).  Unfortunately, she seems to feel worse since starting Tyvaso.  Possibilities are worsening of V/Q mismatch with the pulmonary vasodilator, worsening of underlying lung pathology not related to starting the medication, and development of pulmonary edema/elevated PCWP with use of pulmonary vasodilator.  I did try increasing her Lasix to  lower PCWP.  She is down 13 lbs but this really did not affect her symptoms. 6 minute walk of 118 m is basically stable compared to prior.  - Continue current Lasix. BMET/BNP today.  - Keep Tyvaso going until 5/15.  I will see her back at that time.  I will plan on doing a RHC and deciding on whether to continue the medication.  3. CKD: Creatinine 1.5 when last checked.  BMET today.    4. RA: Recent diagnosis, being treated (Dr. Amil Amen).  5. CHF: Primarily RV failure.  I suspect PCWP is near-normal at this point with diuresis.   6. Depression: Does not see PCP until 5/15.  Significantly depressed mood and anhedonia.  I will start her on sertraline 50 mg daily.     [name] 11/18/2013

## 2013-11-27 ENCOUNTER — Other Ambulatory Visit: Payer: Self-pay | Admitting: Cardiology

## 2013-11-27 ENCOUNTER — Other Ambulatory Visit: Payer: Self-pay | Admitting: Emergency Medicine

## 2013-11-28 ENCOUNTER — Ambulatory Visit: Payer: Medicare Other | Admitting: Cardiology

## 2013-12-03 ENCOUNTER — Encounter: Payer: Self-pay | Admitting: Internal Medicine

## 2013-12-03 ENCOUNTER — Ambulatory Visit (INDEPENDENT_AMBULATORY_CARE_PROVIDER_SITE_OTHER): Payer: Medicare Other | Admitting: Internal Medicine

## 2013-12-03 VITALS — BP 108/72 | HR 94 | Temp 98.6°F | Wt 212.0 lb

## 2013-12-03 DIAGNOSIS — M222X9 Patellofemoral disorders, unspecified knee: Secondary | ICD-10-CM

## 2013-12-03 DIAGNOSIS — M25569 Pain in unspecified knee: Secondary | ICD-10-CM

## 2013-12-03 MED ORDER — PREDNISONE 10 MG PO TABS: ORAL_TABLET | ORAL | Status: DC

## 2013-12-03 NOTE — Patient Instructions (Addendum)
Patellar Tendinitis, Jumper's Knee with Rehab Tendinitis is inflammation of a tendon. Tendonitis of the tendon below the kneecap (patella) is known as patellar tendonitis. Patellar tendonitis is a common cause of pain below the kneecap (infrapatella). Patellar tendonitis may involve a tear (strain) in the ligament. Strains are classified into three categories. Grade 1 strains cause pain, but the tendon is not lengthened. Grade 2 strains include a lengthened ligament, due to the ligament being stretched or partially ruptured. With grade 2 strains there is still function, although function may be decreased. Grade 3 strains involve a complete tear of the tendon or muscle, and function is usually impaired. Patellar tendon strains are usually grade 1 or 2.  SYMPTOMS   Pain, tenderness, swelling, warmth, or redness over the patellar tendon (just below the kneecap).  Pain and loss of strength (sometimes), with forcefully straightening the knee (especially when jumping or rising from a seated or squatting position), or bending the knee completely (squatting or kneeling).  Crackling sound (crepitation) when the tendon is moved or touched. CAUSES  Patellar tendonitis is caused by injury to the patellar tendon. The inflammation is the body's healing response. Common causes of injury include:  Stress from a sudden increase in intensity, frequency, or duration of training.  Overuse of the quadriceps thigh muscles and patellar tendon.  Direct hit (trauma) to the knee or patellar tendon. RISK INCREASES WITH:  Sports that require sudden, explosive thigh muscle (quadriceps) contraction, such as jumping, quick starts, or kicking.  Running sports, especially running down hills.  Poor strength and flexibility of the thigh and knee.  Flat feet. PREVENTION  Warm up and stretch properly before activity.  Allow for adequate recovery between workouts.  Maintain physical fitness:  Strength, flexibility, and  endurance.  Cardiovascular fitness.  Protect the knee joint with taping, protective strapping, bracing, or elastic compression bandage.  Wear arch supports (orthotics). PROGNOSIS  If treated properly, patellar tendonitis usually heals within 6 weeks.  RELATED COMPLICATIONS   Longer healing time, if not properly treated or if not given enough time to heal.  Recurring symptoms, if activity is resumed too soon, with overuse, with a direct blow, or when using poor technique.  If untreated, tendon rupture requiring surgery. TREATMENT Treatment first involves the use of ice and medicine, to reduce pain and inflammation. The use of strengthening and stretching exercises may help reduce pain with activity. These exercises may be performed at home or with a therapist. Serious cases of tendonitis may require restraining the knee for 10 to 14 days, to prevent stress on the tendon and to promote healing. Crutches may be used (uncommon) until you can walk without a limp. For cases in which non-surgical treatment is unsuccessful, surgery may be advised, to remove the inflamed tendon lining (sheath). Surgery is rare, and is only advised after at least 6 months of non-surgical treatment. MEDICATION   If pain medicine is needed, nonsteroidal anti-inflammatory medicines (aspirin and ibuprofen), or other minor pain relievers (acetaminophen), are often advised.  Do not take pain medicine for 7 days before surgery.  Prescription pain relievers may be given, if your caregiver thinks they are needed. Use only as directed and only as much as you need. HEAT AND COLD  Cold treatment (icing) should be applied for 10 to 15 minutes every 2 to 3 hours for inflammation and pain, and immediately after activity that aggravates your symptoms. Use ice packs or an ice massage.  Heat treatment may be used before performing stretching and  strengthening activities prescribed by your caregiver, physical therapist, or athletic  trainer. Use a heat pack or a warm water soak. SEEK MEDICAL CARE IF:  Symptoms get worse or do not improve in 2 weeks, despite treatment.  New, unexplained symptoms develop. (Drugs used in treatment may produce side effects.) EXERCISES RANGE OF MOTION (ROM) AND STRETCHING EXERCISES - Patellar Tendinitis (Jumper's Knee) These are some of the initial exercises with which you may start your rehabilitation program, until you see your caregiver again or until your symptoms are resolved. Remember:   Flexible tissue is more tolerant of the stresses placed on it during activities.  Each stretch should be held for 20 to 30 seconds.  A gentle stretching sensation should be felt. STRETCH Hamstrings, Supine  Lie on your back. Loop a belt or towel over the ball of your right / left foot.  Straighten your right / left knee and slowly pull on the belt to raise your leg. Do not allow the right / left knee to bend. Keep your opposite leg flat on the floor.  Raise the leg until you feel a gentle stretch behind your right / left knee or thigh. Hold this position for __________ seconds. Repeat __________ times. Complete this stretch __________ times per day.  STRETCH - Hamstrings, Doorway  Lie on your back with your right / left leg extended and resting on the wall, and the opposite leg flat on the ground through the door. At first, position your bottom farther away from the wall.  Keep your right / left knee straight. If you feel a stretch behind your knee or thigh, hold this position for __________ seconds.  If you do not feel a stretch, scoot your bottom closer to the door, and hold __________ seconds. Repeat __________ times. Complete this stretch __________ times per day.  STRETCH - Hamstrings, Standing  Stand or sit and extend your right / left leg, placing your foot on a chair or foot stool.  Keep a slight arch in your low back and your hips straight forward.  Lead with your chest and lean  forward at the waist until you feel a gentle stretch in the back of your right / left knee or thigh. (When done correctly, this exercise requires leaning only a small distance.)  Hold this position for __________ seconds. Repeat __________ times. Complete this stretch __________ times per day. STRETCH - Adductors, Lunge  While standing, spread your legs, with your right / left leg behind you.  Lean away from your right / left leg by bending your opposite knee. You may rest your hands on your thigh for balance.  You should feel a stretch in your right / left inner thigh. Hold for __________ seconds. Repeat __________ times. Complete this exercise __________ times per day.  STRENGTHENING EXERCISES - Patellar Tendinitis (Jumper's Knee) These exercises may help you when beginning to rehabilitate your injury. They may resolve your symptoms with or without further involvement from your physician, physical therapist or athletic trainer. While completing these exercises, remember:   Muscles can gain both the endurance and the strength needed for everyday activities through controlled exercises.  Complete these exercises as instructed by your physician, physical therapist or athletic trainer. Increase the resistance and repetitions only as guided by your caregiver. STRENGTH - Quadriceps, Isometrics  Lie on your back with your right / left leg extended and your opposite knee bent.  Gradually tense the muscles in the front of your right / left thigh. You should  see either your kneecap slide up toward your hip or increased dimpling just above the knee. This motion will push the back of the knee down toward the floor, mat, or bed on which you are lying.  Hold the muscle as tight as you can, without increasing your pain, for __________ seconds.  Relax the muscles slowly and completely in between each repetition. Repeat __________ times. Complete this exercise __________ times per day.  STRENGTH -  Quadriceps, Short Arcs  Lie on your back. Place a __________ inch towel roll under your right / left knee, so that the knee bends slightly.  Raise only your lower leg by tightening the muscles in the front of your thigh. Do not allow your thigh to rise.  Hold this position for __________ seconds. Repeat __________ times. Complete this exercise __________ times per day.  OPTIONAL ANKLE WEIGHTS: Begin with ____________________, but DO NOT exceed ____________________. Increase in 1 pound/ 0.5 kilogram increments. STRENGTH - Quadriceps, Straight Leg Raises  Quality counts! Watch for signs that the quadriceps muscle is working, to be sure you are strengthening the correct muscles and not "cheating" by substituting with healthier muscles.  Lay on your back with your right / left leg extended and your opposite knee bent.  Tense the muscles in the front of your right / left thigh. You should see either your kneecap slide up or increased dimpling just above the knee. Your thigh may even shake a bit.  Tighten these muscles even more and raise your leg 4 to 6 inches off the floor. Hold for __________ seconds.  Keeping these muscles tense, lower your leg.  Relax the muscles slowly and completely between each repetition. Repeat __________ times. Complete this exercise __________ times per day.  STRENGTH  Quadriceps, Squats  Stand in a door frame so that your feet and knees are in line with the frame.  Use your hands for balance, not support, on the frame.  Slowly lower your weight, bending at the hips and knees. Keep your lower legs upright so that they are parallel with the door frame. Squat only within the range that does not increase your knee pain. Never let your hips drop below your knees.  Slowly return upright, pushing with your legs, not pulling with your hands. Repeat __________ times. Complete this exercise __________ times per day.  STRENGTH  Quadriceps, Step-Downs  Stand on the edge  of a step stool or stair. Be prepared to use a countertop or wall for balance, if needed.  Keeping your right / left knee directly over the middle of your foot, slowly touch your opposite heel to the floor or lower step. Do not go all the way to the floor if your knee pain increases, just go as far as you can without increased discomfort. Use your right / left leg muscles, not gravity to lower your body weight.  Slowly push your body weight back up to the starting position, Repeat __________ times. Complete this exercise __________ times per day.  Document Released: 08/22/2005 Document Revised: 11/14/2011 Document Reviewed: 12/04/2008 [facility] Patient Information 2014 [place], [place].

## 2013-12-03 NOTE — Progress Notes (Signed)
Pre visit review using our clinic review tool, if applicable. No additional management support is needed unless otherwise documented below in the visit note.

## 2013-12-03 NOTE — Progress Notes (Signed)
Subjective:    Patient ID: Tracey Duncan, female    DOB: 03-12-1946, 68 y.o.   MRN: [id number]  HPI  Pt presents to the clinic today with c/o right knee pain. She reports this started 3 days ago. She describes the pain as sharp and achy. The pain is worse when she bends her leg and better when it is straight. She denies any specific injury to the area. She is taking tramadol which does seem to be helping with the pain.  Review of Systems      Past Medical History  Diagnosis Date  . Hypertension   . Atrial fibrillation     failed cardioversion  . Sinus trouble   . OSA on CPAP   . Asthma   . Hyperlipidemia   . COPD (chronic obstructive pulmonary disease)   . Thyroid disease     Current Outpatient Prescriptions  Medication Sig Dispense Refill  . ADVAIR DISKUS 250-50 MCG/DOSE AEPB INHALE 1 PUFF INTO THE LUNGS EVERY 12 (TWELVE) HOURS.  60 each  5  . albuterol (PROAIR HFA) 108 (90 BASE) MCG/ACT inhaler Inhale 2 puffs into the lungs every 6 (six) hours as needed for wheezing or shortness of breath.       . Artificial Tear Ointment (ARTIFICIAL TEARS) ointment Place 1 drop into both eyes 2 (two) times daily as needed (dry eyes).      . bisoprolol (ZEBETA) 10 MG tablet Take 10 mg by mouth daily.      . calcium carbonate (TUMS - DOSED IN MG ELEMENTAL CALCIUM) 500 MG chewable tablet Chew 2 tablets by mouth daily as needed for heartburn.      . calcium-vitamin D (OSCAL WITH D) 500-200 MG-UNIT per tablet Take 1 tablet by mouth daily.      . cetirizine (ZYRTEC) 10 MG tablet Take 10 mg by mouth at bedtime.       . cholecalciferol (VITAMIN D) 1000 UNITS tablet Take 1,000 Units by mouth daily.      Marland Kitchen ELIQUIS 5 MG TABS tablet TAKE 1 TABLET TWICE A DAY  60 tablet  4  . Fluticasone-Salmeterol (ADVAIR) 250-50 MCG/DOSE AEPB Inhale 1 puff into the lungs every 12 (twelve) hours.      . furosemide (LASIX) 40 MG tablet 1 and 1/2 tablets(total 22m) two times a day  90 tablet  3  . leflunomide (ARAVA) 10  MG tablet Take 10 mg by mouth daily.      .Marland Kitchenlevothyroxine (SYNTHROID, LEVOTHROID) 50 MCG tablet Take 50 mcg by mouth daily before breakfast.      . Magnesium 200 MG TABS 2 tablets (total 4067m daily      . metFORMIN (GLUCOPHAGE) 500 MG tablet Take 1 tablet (500 mg total) by mouth 2 (two) times daily with a meal.  60 tablet  2  . montelukast (SINGULAIR) 10 MG tablet Take 10 mg by mouth at bedtime.      . Multiple Vitamins-Minerals (CENTRUM SILVER PO) Take 1 tablet by mouth daily.      . potassium chloride SA (K-DUR,KLOR-CON) 20 MEQ tablet 3 tablets (total 60 mEq) in AM and 2 tablets (total 40 mEq) in the PM  150 tablet  3  . pravastatin (PRAVACHOL) 20 MG tablet TAKE 1 TABLET BY MOUTH EVERY EVENING  30 tablet  1  . prednisoLONE 5 MG TABS tablet Take 5 mg by mouth daily.      . sertraline (ZOLOFT) 50 MG tablet Take 1 tablet (50 mg total) by mouth  daily.  30 tablet  2  . SPIRIVA HANDIHALER 18 MCG inhalation capsule INHALE 1 CAPSULE VIA HANDIHALER ONCE DAILY AT THE SAME TIME EVERY DAY  30 capsule  3  . traMADol (ULTRAM) 50 MG tablet Take 50 mg by mouth every 8 (eight) hours as needed for pain.      . Treprostinil (TYVASO) 0.6 MG/ML SOLN Inhale 18 mcg into the lungs 4 (four) times daily.      Marland Kitchen zolpidem (AMBIEN) 5 MG tablet Take 1 tablet (5 mg total) by mouth at bedtime as needed for sleep.  15 tablet  0   No current facility-administered medications for this visit.    No Known Allergies  Family History  Problem Relation Age of Onset  . Emphysema Mother   . Allergies Mother   . Arthritis Mother   . Heart disease Father   . Heart disease Brother   . Tongue cancer Paternal Grandmother   . Cancer Cousin     Ovarian Cancer  . Cancer Cousin     Breast Cancer    History   Social History  . Marital Status: Widowed    Spouse Name: N/A    Number of Children: 0  . Years of Education: N/A   Occupational History  . retired     Optometrist   Social History Main Topics  . Smoking status:  Former Smoker -- 1.50 packs/day for 28 years    Types: Cigarettes    Quit date: 09/06/1991  . Smokeless tobacco: Never Used  . Alcohol Use: No     Comment: "very seldom" 09/26/12  . Drug Use: No  . Sexual Activity: Not on file   Other Topics Concern  . Not on file   Social History Narrative   Regular exercise-no   Caffeine Use-yes           Constitutional: Denies fever, malaise, fatigue, headache or abrupt weight changes.  Musculoskeletal: Pt reports right knee pain.    No other specific complaints in a complete review of systems (except as listed in HPI above).  Objective:   Physical Exam   BP 108/72  Pulse 94  Temp(Src) 98.6 F (37 C) (Oral)  Wt 212 lb (96.163 kg) Wt Readings from Last 3 Encounters:  12/03/13 212 lb (96.163 kg)  11/18/13 211 lb (95.709 kg)  11/13/13 213 lb (96.616 kg)    General: Appears her stated age, well developed, well nourished in NAD. Cardiovascular: Normal rate and rhythm. S1,S2 noted.  No murmur, rubs or gallops noted. No JVD or BLE edema. No carotid bruits noted. Pulmonary/Chest: Normal effort and positive vesicular breath sounds. No respiratory distress. No wheezes, rales or ronchi noted.  Musculoskeletal: Pain with palpation of the patella femoral tendon. Negative drawer test. No sign of swelling or inflammation noted.   BMET    Component Value Date/Time   NA 140 11/18/2013 1531   K 4.2 11/18/2013 1531   CL 101 11/18/2013 1531   CO2 30 11/18/2013 1531   GLUCOSE 140* 11/18/2013 1531   BUN 18 11/18/2013 1531   CREATININE 1.5* 11/18/2013 1531   CALCIUM 9.5 11/18/2013 1531   GFRNONAA 32* 07/11/2013 0812   GFRAA 37* 07/11/2013 0812    Lipid Panel     Component Value Date/Time   CHOL 150 10/25/2013 1130   TRIG 118.0 10/25/2013 1130   HDL 52.80 10/25/2013 1130   CHOLHDL 3 10/25/2013 1130   VLDL 23.6 10/25/2013 1130   LDLCALC 74 10/25/2013 1130    CBC  Component Value Date/Time   WBC 12.9* 07/11/2013 0812   RBC 4.18 07/11/2013 0812    HGB 12.2 07/11/2013 0812   HCT 37.6 07/11/2013 0812   PLT 204 07/11/2013 0812   MCV 90.0 07/11/2013 0812   MCH 29.2 07/11/2013 0812   MCHC 32.4 07/11/2013 0812   RDW 17.1* 07/11/2013 0812   LYMPHSABS 0.9 10/08/2012 1131   MONOABS 0.4 10/08/2012 1131   EOSABS 0.1 10/08/2012 1131   BASOSABS 0.0 10/08/2012 1131    Hgb A1C Lab Results  Component Value Date   HGBA1C 6.2 10/08/2012        Assessment & Plan:   Patellar femoral syndrome:  eRx for pred taper- advised her to stop the prednisone she is currently on Continue tramdol Rest your knee for at least the next week  RTC as needed or if symptoms persist or worsen

## 2013-12-26 ENCOUNTER — Other Ambulatory Visit: Payer: Self-pay | Admitting: Cardiology

## 2013-12-26 ENCOUNTER — Other Ambulatory Visit: Payer: Self-pay | Admitting: Internal Medicine

## 2013-12-27 NOTE — Telephone Encounter (Signed)
Called pt and she states she will just wait until her appt in May

## 2013-12-27 NOTE — Telephone Encounter (Signed)
Pt had an acute visit with you recently 12/03/13--however pt has an upcoming appt with Dr Asa Lente 01/2014--it has been over 1 yr since TSH labs were done--please advise

## 2013-12-27 NOTE — Telephone Encounter (Signed)
She needs labs first

## 2014-01-14 ENCOUNTER — Ambulatory Visit: Payer: Medicare Other | Admitting: Internal Medicine

## 2014-01-21 ENCOUNTER — Ambulatory Visit: Payer: Medicare Other | Admitting: Internal Medicine

## 2014-01-21 ENCOUNTER — Other Ambulatory Visit: Payer: Self-pay | Admitting: Cardiology

## 2014-01-23 ENCOUNTER — Ambulatory Visit (INDEPENDENT_AMBULATORY_CARE_PROVIDER_SITE_OTHER): Payer: Medicare Other | Admitting: Cardiology

## 2014-01-23 ENCOUNTER — Encounter: Payer: Self-pay | Admitting: *Deleted

## 2014-01-23 ENCOUNTER — Encounter: Payer: Self-pay | Admitting: Cardiology

## 2014-01-23 VITALS — BP 129/72 | HR 85 | Temp 98.1°F | Ht 64.0 in | Wt 200.0 lb

## 2014-01-23 DIAGNOSIS — I5032 Chronic diastolic (congestive) heart failure: Secondary | ICD-10-CM

## 2014-01-23 DIAGNOSIS — J449 Chronic obstructive pulmonary disease, unspecified: Secondary | ICD-10-CM

## 2014-01-23 DIAGNOSIS — I4891 Unspecified atrial fibrillation: Secondary | ICD-10-CM

## 2014-01-23 DIAGNOSIS — I279 Pulmonary heart disease, unspecified: Secondary | ICD-10-CM

## 2014-01-23 DIAGNOSIS — I2789 Other specified pulmonary heart diseases: Secondary | ICD-10-CM

## 2014-01-23 DIAGNOSIS — I509 Heart failure, unspecified: Secondary | ICD-10-CM

## 2014-01-23 DIAGNOSIS — E662 Morbid (severe) obesity with alveolar hypoventilation: Secondary | ICD-10-CM

## 2014-01-23 DIAGNOSIS — I482 Chronic atrial fibrillation, unspecified: Secondary | ICD-10-CM

## 2014-01-23 DIAGNOSIS — I2721 Secondary pulmonary arterial hypertension: Secondary | ICD-10-CM

## 2014-01-23 DIAGNOSIS — I2781 Cor pulmonale (chronic): Secondary | ICD-10-CM

## 2014-01-23 NOTE — Patient Instructions (Signed)
Your physician recommends that you have  lab work today--BMET.   Your physician recommends that you schedule a follow-up appointment in: 2 weeks after the Right Heart Catheterization with Dr Aundra Dubin in the [facility] [place] are scheduled for a Right Heart Catheterization on Thursday June 4,2015. See instruction sheet.

## 2014-01-24 ENCOUNTER — Encounter (HOSPITAL_COMMUNITY): Payer: Self-pay | Admitting: Pharmacy Technician

## 2014-01-24 LAB — BASIC METABOLIC PANEL
BUN: 25 mg/dL — AB (ref 6–23)
CALCIUM: 9.6 mg/dL (ref 8.4–10.5)
CO2: 31 mEq/L (ref 19–32)
CREATININE: 1.5 mg/dL — AB (ref 0.4–1.2)
Chloride: 98 mEq/L (ref 96–112)
GFR: 36 mL/min — AB (ref >60.00)
GLUCOSE: 116 mg/dL — AB (ref 70–99)
Potassium: 4.6 mEq/L (ref 3.5–5.1)
Sodium: 140 mEq/L (ref 135–145)

## 2014-01-24 NOTE — Progress Notes (Signed)
Patient ID: Tracey Duncan, female   DOB: 08/17/1946, 67 y.o.   MRN: [id number] PCP: Baity  67 yo with history of COPD on home oxygen, OSA (? OHS/OSA), rheumatoid arthritis, and chronic atrial fibrillation presents for cardiology followup.  She was diagnosed with atrial fibrillation around 2004. She failed cardioversion and it has been chronic.  She has noted exertional dyspnea since at least 2012.  She was diagnosed with COPD back in 2002.  She no longer smokes.  She had been on CPAP for OSA prior, but was started on home oxygen all the time by Dr. Byrum.  No tachypalpitations, no chest pain.  Echo in 2/14 showed a moderately dilated RV with moderately decreased systolic function, PA systolic pressure 65 mmHg. She was started on apixaban.   V/Q scan was negative for chronic PE.  She has been diagnosed with rheumatoid arthritis and is on Arava.  I did a right heart cath in 11/14 that confirmed moderate to severe PAH with PVR 7.4 WU.  In 12/14, I started her on Tyvaso.   Weight is down another 11 lbs.  However, she has noted ankle swelling again as she has been taking more prednisone over the last couple of months.  It is hard to tell if her exertional dyspnea has improved or not as she has been having more problems with joint pain recently.  She has not been able to move around a lot because of more pain in knees and legs.  The dose of her Arava was increased recently and as above, she has been on a steroid taper.  She does not think she can do a 6 minute walk today due to the pain.  No orthopnea/PND.  She thinks that the sertraline has helped her mood a lot but it causes her to have a dry mouth.   ECG: atrial fibrillation with right axis deviation, low voltage  Labs (4/14): RF < 10, ANA negative, BNP 294, K 3.8, creatinine 1.7  Labs (9/14): K 4.5, creatinine 1.74 Labs (11/14): K 4.5=>3.9, creatinine 1.6, BNP 300 Labs (2/15): K 4.3, creatinine 1.6=>1.5, BNP 383=>311, LDL 72, HDL 49 Labs (3/15): K 4.2,  creatinine 1.5  PMH: 1. OSA/OHS: On CPAP at night and oxygen during the day. 2. COPD: Diagnosed 2002.  FEV1 65% predicted in 4/14.  3. HTN 4. Chronic atrial fibrillation: Failed DCCV in 12/04.   5. 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  Echo (2/14) with EF 55-60%, moderately dilated RV with moderately decreased systolic function, PA systolic pressure 65 mmHg.  V/Q scan in 3/14 was negative for chronic PE. PFTs (4/14): FEV1 65% predicted, mild obstructive defect with severely decreased DLCO.  RHC (11/14): mean RA 8, PA 68/28 mean 45, mean PCWP 11, CI 2.29, PVR 7.2 WU. 6 minute walk (2/15) 126 meters.  6 minute walk (3/15) 118 m.  She is on Tyvaso. 6. Lexiscan Thallium in 11/11 with no evidence for ischemia or infarction.  7. Impaired fasting glucose. 8. Hypothyroidism 9. Obesity. 10. CKD 11. Rheumatoid arthritis 12. ABIs (11/14): Normal 13. Hyperlipidemia: Myalgias with Crestor.  14. Depression 15. Chronic diastolic CHF  SH: Widow, [place] to live with sister.  Quit smoking in 1993.  Rare ETOH. Retired accountant.   FH: Brother with MI at 60, father with MI at 36.  ROS: All systems reviewed and negative except as per HPI.   Current Outpatient Prescriptions  Medication Sig Dispense Refill  .   ADVAIR DISKUS 250-50 MCG/DOSE AEPB INHALE 1 PUFF INTO THE LUNGS EVERY 12 (TWELVE) HOURS.  60 each  5  . albuterol (PROAIR HFA) 108 (90 BASE) MCG/ACT inhaler Inhale 2 puffs into the lungs every 6 (six) hours as needed for wheezing or shortness of breath.       . Artificial Tear Ointment (ARTIFICIAL TEARS) ointment Place 1 drop into both eyes 2 (two) times daily as needed (dry eyes).      . bisoprolol (ZEBETA) 10 MG tablet Take 10 mg by mouth daily.      . calcium carbonate (TUMS - DOSED IN MG ELEMENTAL CALCIUM) 500 MG chewable tablet Chew 2 tablets by mouth daily as needed for heartburn.      . calcium-vitamin D (OSCAL WITH D)  500-200 MG-UNIT per tablet Take 1 tablet by mouth daily.      . cetirizine (ZYRTEC) 10 MG tablet Take 10 mg by mouth at bedtime.       . cholecalciferol (VITAMIN D) 1000 UNITS tablet Take 1,000 Units by mouth daily.      . ELIQUIS 5 MG TABS tablet TAKE 1 TABLET BY MOUTH TWICE A DAY  60 tablet  1  . Fluticasone-Salmeterol (ADVAIR) 250-50 MCG/DOSE AEPB Inhale 1 puff into the lungs every 12 (twelve) hours.      . furosemide (LASIX) 40 MG tablet 1 and 1/2 tablets(total 60mg) two times a day  90 tablet  3  . KLOR-CON M20 20 MEQ tablet TAKE 3 TABLETS BY MOUTH IN THE MORNING AND 2 TABLETS IN THE EVENING  150 tablet  0  . leflunomide (ARAVA) 10 MG tablet Take 20 mg by mouth daily.       . levothyroxine (SYNTHROID, LEVOTHROID) 50 MCG tablet Take 50 mcg by mouth daily before breakfast.      . Magnesium 200 MG TABS 2 tablets (total 400mg) daily      . metFORMIN (GLUCOPHAGE) 500 MG tablet Take 1 tablet (500 mg total) by mouth 2 (two) times daily with a meal.  60 tablet  2  . montelukast (SINGULAIR) 10 MG tablet Take 10 mg by mouth at bedtime.      . Multiple Vitamins-Minerals (CENTRUM SILVER PO) Take 1 tablet by mouth daily.      . pravastatin (PRAVACHOL) 20 MG tablet TAKE 1 TABLET BY MOUTH EVERY EVENING  30 tablet  1  . prednisoLONE 5 MG TABS tablet Take 5 mg by mouth daily.      . sertraline (ZOLOFT) 50 MG tablet Take 1 tablet (50 mg total) by mouth daily.  30 tablet  2  . SPIRIVA HANDIHALER 18 MCG inhalation capsule INHALE 1 CAPSULE VIA HANDIHALER ONCE DAILY AT THE SAME TIME EVERY DAY  30 capsule  3  . traMADol (ULTRAM) 50 MG tablet Take 50 mg by mouth every 8 (eight) hours as needed for pain.      . Treprostinil (TYVASO) 0.6 MG/ML SOLN Inhale 18 mcg into the lungs 4 (four) times daily.      . zolpidem (AMBIEN) 5 MG tablet Take 1 tablet (5 mg total) by mouth at bedtime as needed for sleep.  15 tablet  0   No current facility-administered medications for this visit.    BP 129/72  Pulse 85  Temp(Src)  98.1 F (36.7 C)  Ht 5' 4" (1.626 m)  Wt 90.719 kg (200 lb)  BMI 34.31 kg/m2 General: NAD, obese.  Neck:JVP 7 cm, no thyromegaly or thyroid nodule.  Lungs: Distant breath sounds bilaterally wth   crackles left base.  CV: Nondisplaced PMI.  Heart irregular S1/S2, no S3/S4, no murmur.  1+ edema 1/3 to knees bilaterally.  No carotid bruit.   Abdomen: Soft, nontender, no hepatosplenomegaly, no distention.  Skin: Intact without lesions or rashes.  Neurologic: Alert and oriented x 3.  Psych: Normal affect. Extremities: No clubbing or cyanosis.    Assessment/Plan: 1. Atrial fibrillation: Chronic x years.  She would be unlikely to remain in NSR if cardioversion were attempted.  Plan rate control.  CHADSVASC = 3 at least.  No history of GI bleeding.  Continue apixaban.  Reasonable rate control with bisoprolol.  2. Pulmonary arterial HTN/cor pulmonale: Significant right heart failure.  Last echo with moderately dilated and dysfunctional RV and PA systolic pressure 65 mmHg, PA systolic pressure 49/75 on RHC with normal PCWP.  PAH likely has mixed etiology: I have suspected that there is a Group I PAH component from rheumatoid arthritis, but patient also has COPD and OHS/OSA.  FEV1 is 65%, so the degree of pulmonary HTN does seem out of proportion to COPD alone.  Given suspected component of Group I PAH, I have started her on Tyvaso (using inhaled agent to target well-ventilated lung segments to avoid worsening of V/Q mismatch with worse hypoxemia that could theoretically occur with use of systemic medication in this patient with parenchymal lung disease from COPD).  She was unable to do a 6 minute walk today due to joint pain from RA.  There has been a question of whether Tyvaso has actually been helping her, but currently it is very hard to tell if her exertional dyspnea is improved or not.  Weight is down considerably with Lasix again.  JVP is not elevated. - Continue current Lasix. BMET/BNP today.  -  Continue Tyvaso for now. - I am going to do a RHC to reassess filling pressures and PA pressure (I will hold Eliquis x 4 doses prior to procedure).  3. CKD: Creatinine 1.5 when last checked.  BMET today.    4. RA: Being treated (Dr. Amil Amen).  She has been having a lot of joint pain recently.  5. CHF: Primarily RV failure.  I suspect PCWP is near-normal at this point with diuresis.   6. Depression: Improved mood with sertraline but has trouble with dry mouth.    Larey Dresser 01/24/2014

## 2014-01-30 ENCOUNTER — Ambulatory Visit: Payer: Medicare Other | Admitting: Internal Medicine

## 2014-01-31 ENCOUNTER — Other Ambulatory Visit: Payer: Self-pay | Admitting: Internal Medicine

## 2014-01-31 ENCOUNTER — Other Ambulatory Visit: Payer: Self-pay | Admitting: Cardiology

## 2014-02-06 ENCOUNTER — Encounter (HOSPITAL_COMMUNITY)
Admission: RE | Disposition: A | Discharge status: Home / Self Care | Payer: Self-pay | Source: Ambulatory Visit | Attending: Cardiology

## 2014-02-06 ENCOUNTER — Ambulatory Visit (HOSPITAL_COMMUNITY)
Admission: RE | Admit: 2014-02-06 | Discharge: 2014-02-06 | Disposition: A | Discharge status: Home / Self Care | Payer: Medicare Other | Source: Ambulatory Visit | Attending: Cardiology | Admitting: Cardiology

## 2014-02-06 DIAGNOSIS — J449 Chronic obstructive pulmonary disease, unspecified: Secondary | ICD-10-CM | POA: Insufficient documentation

## 2014-02-06 DIAGNOSIS — M069 Rheumatoid arthritis, unspecified: Secondary | ICD-10-CM | POA: Insufficient documentation

## 2014-02-06 DIAGNOSIS — R7301 Impaired fasting glucose: Secondary | ICD-10-CM | POA: Insufficient documentation

## 2014-02-06 DIAGNOSIS — Z7901 Long term (current) use of anticoagulants: Secondary | ICD-10-CM | POA: Insufficient documentation

## 2014-02-06 DIAGNOSIS — F3289 Other specified depressive episodes: Secondary | ICD-10-CM | POA: Insufficient documentation

## 2014-02-06 DIAGNOSIS — I129 Hypertensive chronic kidney disease with stage 1 through stage 4 chronic kidney disease, or unspecified chronic kidney disease: Secondary | ICD-10-CM | POA: Insufficient documentation

## 2014-02-06 DIAGNOSIS — G4733 Obstructive sleep apnea (adult) (pediatric): Secondary | ICD-10-CM | POA: Insufficient documentation

## 2014-02-06 DIAGNOSIS — E039 Hypothyroidism, unspecified: Secondary | ICD-10-CM | POA: Insufficient documentation

## 2014-02-06 DIAGNOSIS — Z6834 Body mass index (BMI) 34.0-34.9, adult: Secondary | ICD-10-CM | POA: Insufficient documentation

## 2014-02-06 DIAGNOSIS — I5032 Chronic diastolic (congestive) heart failure: Secondary | ICD-10-CM | POA: Insufficient documentation

## 2014-02-06 DIAGNOSIS — I509 Heart failure, unspecified: Secondary | ICD-10-CM | POA: Insufficient documentation

## 2014-02-06 DIAGNOSIS — F329 Major depressive disorder, single episode, unspecified: Secondary | ICD-10-CM | POA: Insufficient documentation

## 2014-02-06 DIAGNOSIS — I279 Pulmonary heart disease, unspecified: Secondary | ICD-10-CM

## 2014-02-06 DIAGNOSIS — E785 Hyperlipidemia, unspecified: Secondary | ICD-10-CM | POA: Insufficient documentation

## 2014-02-06 DIAGNOSIS — IMO0002 Reserved for concepts with insufficient information to code with codable children: Secondary | ICD-10-CM | POA: Insufficient documentation

## 2014-02-06 DIAGNOSIS — E662 Morbid (severe) obesity with alveolar hypoventilation: Secondary | ICD-10-CM | POA: Insufficient documentation

## 2014-02-06 DIAGNOSIS — I4891 Unspecified atrial fibrillation: Secondary | ICD-10-CM | POA: Insufficient documentation

## 2014-02-06 DIAGNOSIS — Z9981 Dependence on supplemental oxygen: Secondary | ICD-10-CM | POA: Insufficient documentation

## 2014-02-06 DIAGNOSIS — J4489 Other specified chronic obstructive pulmonary disease: Secondary | ICD-10-CM | POA: Insufficient documentation

## 2014-02-06 DIAGNOSIS — I2789 Other specified pulmonary heart diseases: Secondary | ICD-10-CM | POA: Insufficient documentation

## 2014-02-06 DIAGNOSIS — N189 Chronic kidney disease, unspecified: Secondary | ICD-10-CM | POA: Insufficient documentation

## 2014-02-06 DIAGNOSIS — Z87891 Personal history of nicotine dependence: Secondary | ICD-10-CM | POA: Insufficient documentation

## 2014-02-06 DIAGNOSIS — E669 Obesity, unspecified: Secondary | ICD-10-CM | POA: Insufficient documentation

## 2014-02-06 HISTORY — PX: RIGHT HEART CATHETERIZATION: SHX5447

## 2014-02-06 LAB — POCT I-STAT 3, VENOUS BLOOD GAS (G3P V)
Acid-Base Excess: 5 mmol/L — ABNORMAL HIGH (ref 0.0–2.0)
Bicarbonate: 30.2 mEq/L — ABNORMAL HIGH (ref 20.0–24.0)
O2 Saturation: 54 %
PH VEN: 7.414 — AB (ref 7.250–7.300)
TCO2: 32 mmol/L (ref 0–100)
pCO2, Ven: 47.2 mmHg (ref 45.0–50.0)
pO2, Ven: 29 mmHg — CL (ref 30.0–45.0)

## 2014-02-06 LAB — CBC
HEMATOCRIT: 32.1 % — AB (ref 36.0–46.0)
Hemoglobin: 10.2 g/dL — ABNORMAL LOW (ref 12.0–15.0)
MCH: 27.6 pg (ref 26.0–34.0)
MCHC: 31.8 g/dL (ref 30.0–36.0)
MCV: 86.8 fL (ref 78.0–100.0)
PLATELETS: 240 10*3/uL (ref 150–400)
RBC: 3.7 MIL/uL — ABNORMAL LOW (ref 3.87–5.11)
RDW: 18.2 % — AB (ref 11.5–15.5)
WBC: 13 10*3/uL — AB (ref 4.0–10.5)

## 2014-02-06 LAB — GLUCOSE, CAPILLARY: Glucose-Capillary: 120 mg/dL — ABNORMAL HIGH (ref 70–99)

## 2014-02-06 LAB — PROTIME-INR
INR: 1.19 (ref 0.00–1.49)
Prothrombin Time: 14.8 seconds (ref 11.6–15.2)

## 2014-02-06 SURGERY — RIGHT HEART CATH
Anesthesia: LOCAL

## 2014-02-06 MED ORDER — LIDOCAINE HCL (PF) 1 % IJ SOLN
INTRAMUSCULAR | Status: AC
  Filled 2014-02-06: qty 30

## 2014-02-06 MED ORDER — SODIUM CHLORIDE 0.9 % IV SOLN: 250.0000 mL | INTRAVENOUS | Status: DC | PRN

## 2014-02-06 MED ORDER — SODIUM CHLORIDE 0.9 % IJ SOLN: 3.0000 mL | Freq: Two times a day (BID) | INTRAMUSCULAR | Status: DC

## 2014-02-06 MED ORDER — HEPARIN (PORCINE) IN NACL 2-0.9 UNIT/ML-% IJ SOLN
INTRAMUSCULAR | Status: AC
  Filled 2014-02-06: qty 1000

## 2014-02-06 MED ORDER — SODIUM CHLORIDE 0.9 % IJ SOLN
3.0000 mL | INTRAMUSCULAR | Status: DC | PRN
  Administered 2014-02-06: 3 mL via INTRAVENOUS

## 2014-02-06 MED ORDER — SODIUM CHLORIDE 0.9 % IV SOLN
INTRAVENOUS | Status: DC
  Administered 2014-02-06: 08:00:00 via INTRAVENOUS

## 2014-02-06 MED ORDER — FENTANYL CITRATE 0.05 MG/ML IJ SOLN
INTRAMUSCULAR | Status: AC
  Filled 2014-02-06: qty 2

## 2014-02-06 MED ORDER — SODIUM CHLORIDE 0.9 % IJ SOLN: 3.0000 mL | INTRAMUSCULAR | Status: DC | PRN

## 2014-02-06 MED ORDER — ASPIRIN 81 MG PO CHEW
81.0000 mg | CHEWABLE_TABLET | ORAL | Status: AC
  Administered 2014-02-06: 81 mg via ORAL
  Filled 2014-02-06: qty 1

## 2014-02-06 MED ORDER — MIDAZOLAM HCL 2 MG/2ML IJ SOLN
INTRAMUSCULAR | Status: AC
Start: 2014-02-06 — End: 2014-02-06
  Filled 2014-02-06: qty 2

## 2014-02-06 MED ORDER — ACETAMINOPHEN 325 MG PO TABS: 650.0000 mg | ORAL_TABLET | ORAL | Status: DC | PRN

## 2014-02-06 MED ORDER — ONDANSETRON HCL 4 MG/2ML IJ SOLN: 4.0000 mg | Freq: Four times a day (QID) | INTRAMUSCULAR | Status: DC | PRN

## 2014-02-06 NOTE — H&P (View-Only) (Signed)
Patient ID: Tracey Duncan, female   DOB: 16-Mar-1946, 68 y.o.   MRN: [id number] PCP: Baity  68 yo with history of COPD on home oxygen, OSA (? OHS/OSA), rheumatoid arthritis, and chronic atrial fibrillation presents for cardiology followup.  She was diagnosed with atrial fibrillation around 2004. She failed cardioversion and it has been chronic.  She has noted exertional dyspnea since at least 2012.  She was diagnosed with COPD back in 2002.  She no longer smokes.  She had been on CPAP for OSA prior, but was started on home oxygen all the time by Dr. Lamonte Sakai.  No tachypalpitations, no chest pain.  Echo in 2/14 showed a moderately dilated RV with moderately decreased systolic function, PA systolic pressure 65 mmHg. She was started on apixaban.   V/Q scan was negative for chronic PE.  She has been diagnosed with rheumatoid arthritis and is on [name].  I did a right heart cath in 11/14 that confirmed moderate to severe PAH with PVR 7.4 WU.  In 12/14, I started her on Tyvaso.   Weight is down another 11 lbs.  However, she has noted ankle swelling again as she has been taking more prednisone over the last couple of months.  It is hard to tell if her exertional dyspnea has improved or not as she has been having more problems with joint pain recently.  She has not been able to move around a lot because of more pain in knees and legs.  The dose of her Jolee Ewing was increased recently and as above, she has been on a steroid taper.  She does not think she can do a 6 minute walk today due to the pain.  No orthopnea/PND.  She thinks that the sertraline has helped her mood a lot but it causes her to have a dry mouth.   ECG: atrial fibrillation with right axis deviation, low voltage  Labs (4/14): RF < 10, ANA negative, BNP 294, K 3.8, creatinine 1.7  Labs (9/14): K 4.5, creatinine 1.74 Labs (11/14): K 4.5=>3.9, creatinine 1.6, BNP 300 Labs (2/15): K 4.3, creatinine 1.6=>1.5, BNP 383=>311, LDL 72, HDL 49 Labs (3/15): K 4.2,  creatinine 1.5  PMH: 1. OSA/OHS: On CPAP at night and oxygen during the day. 2. COPD: Diagnosed 2002.  FEV1 65% predicted in 4/14.  3. HTN 4. Chronic atrial fibrillation: Failed DCCV in 12/04.   5. 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  Echo (2/14) with EF 55-60%, moderately dilated RV with moderately decreased systolic function, PA systolic pressure 65 mmHg.  V/Q scan in 3/14 was negative for chronic PE. PFTs (4/14): FEV1 65% predicted, mild obstructive defect with severely decreased DLCO.  RHC (11/14): mean RA 8, PA 68/28 mean 45, mean PCWP 11, CI 2.29, PVR 7.2 WU. 6 minute walk (2/15) 126 meters.  6 minute walk (3/15) 118 m.  She is on Tyvaso. 6. Lexiscan Thallium in 11/11 with no evidence for ischemia or infarction.  7. Impaired fasting glucose. 8. Hypothyroidism 9. Obesity. 10. CKD 11. Rheumatoid arthritis 12. ABIs (11/14): Normal 13. Hyperlipidemia: Myalgias with Crestor.  14. Depression 15. Chronic diastolic CHF  SH: Widow, moved to [place] from [place] to live with sister.  Quit smoking in 1993.  Rare ETOH. Retired Optometrist.   FH: Brother with MI at 58, father with MI at 79.  ROS: All systems reviewed and negative except as per HPI.   Current Outpatient Prescriptions  Medication Sig Dispense Refill  .  ADVAIR DISKUS 250-50 MCG/DOSE AEPB INHALE 1 PUFF INTO THE LUNGS EVERY 12 (TWELVE) HOURS.  60 each  5  . albuterol (PROAIR HFA) 108 (90 BASE) MCG/ACT inhaler Inhale 2 puffs into the lungs every 6 (six) hours as needed for wheezing or shortness of breath.       . Artificial Tear Ointment (ARTIFICIAL TEARS) ointment Place 1 drop into both eyes 2 (two) times daily as needed (dry eyes).      . bisoprolol (ZEBETA) 10 MG tablet Take 10 mg by mouth daily.      . calcium carbonate (TUMS - DOSED IN MG ELEMENTAL CALCIUM) 500 MG chewable tablet Chew 2 tablets by mouth daily as needed for heartburn.      . calcium-vitamin D (OSCAL WITH D)  500-200 MG-UNIT per tablet Take 1 tablet by mouth daily.      . cetirizine (ZYRTEC) 10 MG tablet Take 10 mg by mouth at bedtime.       . cholecalciferol (VITAMIN D) 1000 UNITS tablet Take 1,000 Units by mouth daily.      Marland Kitchen ELIQUIS 5 MG TABS tablet TAKE 1 TABLET BY MOUTH TWICE A DAY  60 tablet  1  . Fluticasone-Salmeterol (ADVAIR) 250-50 MCG/DOSE AEPB Inhale 1 puff into the lungs every 12 (twelve) hours.      . furosemide (LASIX) 40 MG tablet 1 and 1/2 tablets(total 59m) two times a day  90 tablet  3  . KLOR-CON M20 20 MEQ tablet TAKE 3 TABLETS BY MOUTH IN THE MORNING AND 2 TABLETS IN THE EVENING  150 tablet  0  . leflunomide (ARAVA) 10 MG tablet Take 20 mg by mouth daily.       .Marland Kitchenlevothyroxine (SYNTHROID, LEVOTHROID) 50 MCG tablet Take 50 mcg by mouth daily before breakfast.      . Magnesium 200 MG TABS 2 tablets (total 401m daily      . metFORMIN (GLUCOPHAGE) 500 MG tablet Take 1 tablet (500 mg total) by mouth 2 (two) times daily with a meal.  60 tablet  2  . montelukast (SINGULAIR) 10 MG tablet Take 10 mg by mouth at bedtime.      . Multiple Vitamins-Minerals (CENTRUM SILVER PO) Take 1 tablet by mouth daily.      . pravastatin (PRAVACHOL) 20 MG tablet TAKE 1 TABLET BY MOUTH EVERY EVENING  30 tablet  1  . prednisoLONE 5 MG TABS tablet Take 5 mg by mouth daily.      . sertraline (ZOLOFT) 50 MG tablet Take 1 tablet (50 mg total) by mouth daily.  30 tablet  2  . SPIRIVA HANDIHALER 18 MCG inhalation capsule INHALE 1 CAPSULE VIA HANDIHALER ONCE DAILY AT THE SAME TIME EVERY DAY  30 capsule  3  . traMADol (ULTRAM) 50 MG tablet Take 50 mg by mouth every 8 (eight) hours as needed for pain.      . Treprostinil (TYVASO) 0.6 MG/ML SOLN Inhale 18 mcg into the lungs 4 (four) times daily.      . Marland Kitchenolpidem (AMBIEN) 5 MG tablet Take 1 tablet (5 mg total) by mouth at bedtime as needed for sleep.  15 tablet  0   No current facility-administered medications for this visit.    BP 129/72  Pulse 85  Temp(Src)  98.1 F (36.7 C)  Ht _0  (1.626 m)  Wt 90.719 kg (200 lb)  BMI 34.31 kg/m2 General: NAD, obese.  Neck:JVP 7 cm, no thyromegaly or thyroid nodule.  Lungs: Distant breath sounds bilaterally wth  crackles left base.  CV: Nondisplaced PMI.  Heart irregular S1/S2, no S3/S4, no murmur.  1+ edema 1/3 to knees bilaterally.  No carotid bruit.   Abdomen: Soft, nontender, no hepatosplenomegaly, no distention.  Skin: Intact without lesions or rashes.  Neurologic: Alert and oriented x 3.  Psych: Normal affect. Extremities: No clubbing or cyanosis.    Assessment/Plan: 1. Atrial fibrillation: Chronic x years.  She would be unlikely to remain in NSR if cardioversion were attempted.  Plan rate control.  CHADSVASC = 3 at least.  No history of GI bleeding.  Continue apixaban.  Reasonable rate control with bisoprolol.  2. Pulmonary arterial HTN/cor pulmonale: Significant right heart failure.  Last echo with moderately dilated and dysfunctional RV and PA systolic pressure 65 mmHg, PA systolic pressure 65/80 on RHC with normal PCWP.  PAH likely has mixed etiology: I have suspected that there is a Group I PAH component from rheumatoid arthritis, but patient also has COPD and OHS/OSA.  FEV1 is 65%, so the degree of pulmonary HTN does seem out of proportion to COPD alone.  Given suspected component of Group I PAH, I have started her on Tyvaso (using inhaled agent to target well-ventilated lung segments to avoid worsening of V/Q mismatch with worse hypoxemia that could theoretically occur with use of systemic medication in this patient with parenchymal lung disease from COPD).  She was unable to do a 6 minute walk today due to joint pain from RA.  There has been a question of whether Tyvaso has actually been helping her, but currently it is very hard to tell if her exertional dyspnea is improved or not.  Weight is down considerably with Lasix again.  JVP is not elevated. - Continue current Lasix. BMET/BNP today.  -  Continue Tyvaso for now. - I am going to do a RHC to reassess filling pressures and PA pressure (I will hold Eliquis x 4 doses prior to procedure).  3. CKD: Creatinine 1.5 when last checked.  BMET today.    4. RA: Being treated (Dr. Amil Amen).  She has been having a lot of joint pain recently.  5. CHF: Primarily RV failure.  I suspect PCWP is near-normal at this point with diuresis.   6. Depression: Improved mood with sertraline but has trouble with dry mouth.    Larey Dresser 01/24/2014

## 2014-02-06 NOTE — Interval H&P Note (Signed)
History and Physical Interval Note:  02/06/2014 8:24 AM  Tracey Duncan  has presented today for surgery, with the diagnosis of HEART FAILURE  The various methods of treatment have been discussed with the patient and family. After consideration of risks, benefits and other options for treatment, the patient has consented to  Procedure(s): RIGHT HEART CATH (N/A) as a surgical intervention .  The patient's history has been reviewed, patient examined, no change in status, stable for surgery.  I have reviewed the patient's chart and labs.  Questions were answered to the patient's satisfaction.     Lorita Forinash Claris Gladden

## 2014-02-06 NOTE — CV Procedure (Signed)
Cardiac Catheterization Procedure Note  Name: Tracey Duncan MRN: [id number] DOB: [birth year]  Procedure: Right Heart Cath  Indication: Pulmonary arterial hypertension   Procedural Details: There was a pre-existing peripheral IV in the right brachial area.  This was exchanged out for a 5 French venous sheath. A Swan-Ganz catheter was used for the right heart catheterization. Standard protocol was followed for recording of right heart pressures and sampling of oxygen saturations. Fick cardiac output was calculated. There were no immediate procedural complications. The patient was transferred to the post catheterization recovery area for further monitoring.  Procedural Findings: Hemodynamics (mmHg) RA mean 7 RV 55/6 PA 65/28, mean 43 PCWP mean 16  Oxygen saturations: PA 54% AO 97%  Cardiac Output (Fick) 4.37  Cardiac Index (Fick) 2.23 PVR 6.2 WU   Final Conclusions:  RA pressure and PCWP look good, still with moderate pulmonary hypertension.  This study really shows no significant change compared to the pre-Tyvaso RHC.  I suspect that patient may have [place] primarily due to COPD and OHS/OSA rather than group I PAH.    Recommendations: Will have her hold Tyvaso and see if this makes her feel any worse.  If not, will probably stop it.   Larey Dresser 02/06/2014, 9:03 AM

## 2014-02-06 NOTE — Discharge Instructions (Signed)
Wound Care Wound care helps prevent pain and infection.   Only take medicine as told by your doctor.  Clean the wound daily with mild soap and water.  Remove bandages (dressings) in 24 hours or as told by your doctor.  Change the bandage if it gets wet, dirty, or starts to smell.  May take shower in 24 hours. Do not take baths, swim, or do anything that puts your wound under water until healed.  Rest and raise (elevate) the wound until the pain and puffiness (swelling) are better.  Keep all doctor visits as told. GET HELP RIGHT AWAY IF:   Yellowish-white fluid (pus) comes from the wound.  Medicine does not lessen your pain.  There is a red streak going away from the wound.  You have a fever. MAKE SURE YOU:   Understand these instructions.  Will watch your condition.  Will get help right away if you are not doing well or get worse. Document Released: 05/31/2008 Document Revised: 11/14/2011 Document Reviewed: 12/26/2010 [facility] Patient Information 2014 [place], [place].

## 2014-02-07 ENCOUNTER — Ambulatory Visit (INDEPENDENT_AMBULATORY_CARE_PROVIDER_SITE_OTHER): Payer: Medicare Other | Admitting: Internal Medicine

## 2014-02-07 ENCOUNTER — Encounter: Payer: Self-pay | Admitting: Internal Medicine

## 2014-02-07 ENCOUNTER — Other Ambulatory Visit (INDEPENDENT_AMBULATORY_CARE_PROVIDER_SITE_OTHER): Payer: Medicare Other

## 2014-02-07 VITALS — BP 120/72 | HR 105 | Temp 98.6°F | Ht 64.0 in | Wt 204.0 lb

## 2014-02-07 DIAGNOSIS — Z1239 Encounter for other screening for malignant neoplasm of breast: Secondary | ICD-10-CM

## 2014-02-07 DIAGNOSIS — R7309 Other abnormal glucose: Secondary | ICD-10-CM

## 2014-02-07 DIAGNOSIS — R7303 Prediabetes: Secondary | ICD-10-CM

## 2014-02-07 DIAGNOSIS — E039 Hypothyroidism, unspecified: Secondary | ICD-10-CM

## 2014-02-07 DIAGNOSIS — Z23 Encounter for immunization: Secondary | ICD-10-CM

## 2014-02-07 DIAGNOSIS — J449 Chronic obstructive pulmonary disease, unspecified: Secondary | ICD-10-CM

## 2014-02-07 LAB — TSH: TSH: 2.64 u[IU]/mL (ref 0.35–4.50)

## 2014-02-07 LAB — HEMOGLOBIN A1C: HEMOGLOBIN A1C: 6.2 % (ref 4.6–6.5)

## 2014-02-07 NOTE — Progress Notes (Signed)
Pre visit review using our clinic review tool, if applicable. No additional management support is needed unless otherwise documented below in the visit note.

## 2014-02-07 NOTE — Progress Notes (Signed)
Subjective:    Patient ID: Tracey Duncan, female    DOB: 04/24/1946, 68 y.o.   MRN: [id number]  HPI  Patient is here for follow up  Reviewed chronic medical issues and interval medical events  Past Medical History  Diagnosis Date  . Hypertension   . Atrial fibrillation     failed cardioversion, on anticoag  . OSA on CPAP   . Asthma   . Hyperlipidemia   . COPD (chronic obstructive pulmonary disease)     with pulm HTN - on 6-8LPM O2  . Chronic diastolic CHF (congestive heart failure)   . Cor pulmonale   . Obesity hypoventilation syndrome   . Diabetes type 2, controlled   . Pulmonary arterial hypertension   . Secondary PAH   . Unspecified hypothyroidism     Review of Systems  Constitutional: Positive for fatigue. Negative for fever.  HENT:       L side mouth sore at inner lip/bite line  Respiratory: Positive for shortness of breath (chronic w/o change). Negative for cough.   Cardiovascular: Positive for leg swelling (B feet, chronic and worse at end of the day). Negative for chest pain.  Genitourinary: Vaginal pain: chronic.  Musculoskeletal: Positive for arthralgias and myalgias (chronic). Negative for joint swelling.  Neurological: Positive for numbness. Negative for headaches.       Objective:   Physical Exam  BP 120/72  Pulse 105  Temp(Src) 98.6 F (37 C) (Oral)  Ht _0  (1.626 m)  Wt 204 lb (92.534 kg)  BMI 35.00 kg/m2  SpO2 95% Wt Readings from Last 3 Encounters:  02/07/14 204 lb (92.534 kg)  02/06/14 200 lb (90.719 kg)  02/06/14 200 lb (90.719 kg)   Constitutional: She is MO, wearing O2 - sister at side. Pt appears well-developed, well-nourished and no distress.  HENT: Indurated and inflamed soft tissue swelling left inner mouth bucal mucosa at bite line, no ulceration or purulence Neck: thick. Normal range of motion. Neck supple. No JVD present. No thyromegaly present.  Cardiovascular: Normal rate, regular rhythm and normal heart sounds.  No murmur  heard. fatty ankles with trace BLE edema. Pulmonary/Chest: Effort normal and breath sounds diminished. No respiratory distress. She has no wheezes.  Psychiatric: She has a normal mood and affect. Her behavior is normal. Judgment and thought content normal.   Lab Results  Component Value Date   WBC 13.0* 02/06/2014   HGB 10.2* 02/06/2014   HCT 32.1* 02/06/2014   PLT 240 02/06/2014   GLUCOSE 116* 01/23/2014   CHOL 150 10/25/2013   TRIG 118.0 10/25/2013   HDL 52.80 10/25/2013   LDLCALC 74 10/25/2013   ALT 11 10/25/2013   AST 17 10/25/2013   NA 140 01/23/2014   K 4.6 01/23/2014   CL 98 01/23/2014   CREATININE 1.5* 01/23/2014   BUN 25* 01/23/2014   CO2 31 01/23/2014   TSH 4.54 10/25/2012   INR 1.19 02/06/2014   HGBA1C 6.2 10/08/2012    No results found.     Assessment & Plan:   Mouth pain. He appears to be related to repeated trauma. Advised topical Orajel for symptomatic relief, followup with dental specialist and consciously avoid repeat trauma to this area  Problem List Items Addressed This Visit   COPD (chronic obstructive pulmonary disease)     Hypoxic respiratory failure with obstructive sleep apnea creating secondary pulmonary hypertension Right heart cath 02/06/2014 reviewed -now off Tyvaso to see if symptoms decompensate or remain unchanged -followup with cardiology and  pulmonary as planned Medical regimen reviewed and updated, no changes recommended by me Remains on chronic prednisone, reportedly for rheumatoid arthritis, yet review of notes indicate rheumatology has cleared to taper off same    Prediabetes - Primary      On metformin for same Chronic pred for RA Does not monitor home cbg Check a1c and titrate as needed Lab Results  Component Value Date   HGBA1C 6.2 10/08/2012       Relevant Orders      Hemoglobin A1c (Completed)   Unspecified hypothyroidism      check TSH today -will titrate as needed Lab Results  Component Value Date   TSH 4.54 10/25/2012        Relevant  Orders      TSH (Completed)    Other Visit Diagnoses   Other screening breast examination        Relevant Orders       MM DIGITAL SCREENING BILATERAL    Need for prophylactic vaccination against Streptococcus pneumoniae (pneumococcus)        Relevant Orders       Pneumococcal polysaccharide vaccine 23-valent greater than or equal to 2yo subcutaneous/IM (Completed)

## 2014-02-07 NOTE — Assessment & Plan Note (Addendum)
On metformin for same Chronic pred for RA Does not monitor home cbg Check a1c and titrate as needed Lab Results  Component Value Date   HGBA1C 6.2 10/08/2012

## 2014-02-07 NOTE — Assessment & Plan Note (Signed)
check TSH today -will titrate as needed Lab Results  Component Value Date   TSH 4.54 10/25/2012

## 2014-02-07 NOTE — Patient Instructions (Signed)
It was good to see you today.  We have reviewed your prior records including labs and tests today  Pneumonia vaccine updated today  Test(s) ordered today. Your results will be released to [place] (or called to you) after review, usually within 72hours after test completion. If any changes need to be made, you will be notified at that same time.  Medications reviewed and updated, no changes recommended at this time.  Please schedule followup in 3-6 months, call sooner if problems.

## 2014-02-09 NOTE — Assessment & Plan Note (Signed)
Hypoxic respiratory failure with obstructive sleep apnea creating secondary pulmonary hypertension Right heart cath 02/06/2014 reviewed -now off Tyvaso to see if symptoms decompensate or remain unchanged -followup with cardiology and pulmonary as planned Medical regimen reviewed and updated, no changes recommended by me Remains on chronic prednisone, reportedly for rheumatoid arthritis, yet review of notes indicate rheumatology has cleared to taper off same

## 2014-02-14 ENCOUNTER — Telehealth: Payer: Self-pay | Admitting: Internal Medicine

## 2014-02-14 MED ORDER — METFORMIN HCL 500 MG PO TABS: 500.0000 mg | ORAL_TABLET | Freq: Two times a day (BID) | ORAL | Status: DC

## 2014-02-14 MED ORDER — LEVOTHYROXINE SODIUM 50 MCG PO TABS: 50.0000 ug | ORAL_TABLET | Freq: Every day | ORAL | Status: DC

## 2014-02-14 NOTE — Telephone Encounter (Signed)
Called pt inform her md release results to mychart, but did give her md response. [facility]

## 2014-02-14 NOTE — Telephone Encounter (Signed)
Patient calling for lab results.  And, she needs refill on Meformin and Levothyroxine.  [facility]

## 2014-02-19 ENCOUNTER — Other Ambulatory Visit: Payer: Self-pay | Admitting: Cardiology

## 2014-02-24 ENCOUNTER — Ambulatory Visit (HOSPITAL_COMMUNITY)
Admission: RE | Admit: 2014-02-24 | Discharge: 2014-02-24 | Disposition: A | Discharge status: Home / Self Care | Payer: Medicare Other | Source: Ambulatory Visit | Attending: Cardiology | Admitting: Cardiology

## 2014-02-24 ENCOUNTER — Encounter (HOSPITAL_COMMUNITY): Payer: Self-pay

## 2014-02-24 VITALS — BP 98/62 | HR 88 | Resp 20 | Wt 201.5 lb

## 2014-02-24 DIAGNOSIS — I4891 Unspecified atrial fibrillation: Secondary | ICD-10-CM | POA: Insufficient documentation

## 2014-02-24 DIAGNOSIS — I5032 Chronic diastolic (congestive) heart failure: Secondary | ICD-10-CM | POA: Insufficient documentation

## 2014-02-24 DIAGNOSIS — I482 Chronic atrial fibrillation, unspecified: Secondary | ICD-10-CM

## 2014-02-24 DIAGNOSIS — J438 Other emphysema: Secondary | ICD-10-CM | POA: Insufficient documentation

## 2014-02-24 DIAGNOSIS — Z9989 Dependence on other enabling machines and devices: Secondary | ICD-10-CM

## 2014-02-24 DIAGNOSIS — E662 Morbid (severe) obesity with alveolar hypoventilation: Secondary | ICD-10-CM | POA: Insufficient documentation

## 2014-02-24 DIAGNOSIS — I2721 Secondary pulmonary arterial hypertension: Secondary | ICD-10-CM

## 2014-02-24 DIAGNOSIS — G4733 Obstructive sleep apnea (adult) (pediatric): Secondary | ICD-10-CM | POA: Insufficient documentation

## 2014-02-24 DIAGNOSIS — I509 Heart failure, unspecified: Secondary | ICD-10-CM

## 2014-02-24 DIAGNOSIS — I2789 Other specified pulmonary heart diseases: Secondary | ICD-10-CM

## 2014-02-24 NOTE — Progress Notes (Signed)
Patient ID: Tracey Duncan, female   DOB: 29-Sep-1945, 68 y.o.   MRN: [id number] PCP: Baity  68 yo with history of COPD on home oxygen, OHS/OSA, rheumatoid arthritis, and chronic atrial fibrillation presents for cardiology followup.  She was diagnosed with atrial fibrillation around 2004. She failed cardioversion and it has been chronic.  She has noted exertional dyspnea since at least 2012.  She was diagnosed with COPD back in 2002.  She no longer smokes.  She had been on CPAP for OSA prior, but was started on home oxygen all the time by Dr. Lamonte Sakai.  No tachypalpitations, no chest pain.  Echo in 2/14 showed a moderately dilated RV with moderately decreased systolic function, PA systolic pressure 65 mmHg. She was started on apixaban.   V/Q scan was negative for chronic PE.  She has been diagnosed with rheumatoid arthritis and is on [name].  I did a right heart cath in 11/14 that confirmed moderate to severe PAH with PVR 7.4 WU.  In 12/14, I started her on Tyvaso.   After last appointment, I did a repeat [place] in 6/15.  This was nearly identical to the prior study in 11/14. Given apparent minimal response to Tyvaso, I had her try coming off the medication.  Off Tyvaso, she feels that her breathing is definitely better. She is not short of breath walking around the house with her walker.  She feels pretty good.  Her main complaint now is migratory joint pains (knees, ankles, shoulders).    Weight is stable.   Labs (4/14): RF < 10, ANA negative, BNP 294, K 3.8, creatinine 1.7  Labs (9/14): K 4.5, creatinine 1.74 Labs (11/14): K 4.5=>3.9, creatinine 1.6, BNP 300 Labs (2/15): K 4.3, creatinine 1.6=>1.5, BNP 383=>311, LDL 72, HDL 49 Labs (3/15): K 4.2, creatinine 1.5 Labs (5/15): K 4.6, creatinine 1.5 Labs (6/15): HCT 32.1  PMH: 1. OSA/OHS: On CPAP at night and oxygen during the day. 2. COPD: Diagnosed 2002.  FEV1 65% predicted in 4/14.  3. HTN 4. Chronic atrial fibrillation: Failed DCCV in 12/04.   5.  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  Echo (2/14) with EF 55-60%, moderately dilated RV with moderately decreased systolic function, PA systolic pressure 65 mmHg.  V/Q scan in 3/14 was negative for chronic PE. PFTs (4/14): FEV1 65% predicted, mild obstructive defect with severely decreased DLCO.  RHC (11/14): mean RA 8, PA 68/28 mean 45, mean PCWP 11, CI 2.29, PVR 7.2 WU. 6 minute walk (2/15) 126 meters.  6 minute walk (3/15) 118 m.  She started Tyvaso.  Repeat RHC in 6/15 on Tyvaso with mean RA 7, PA 65/28 mean 43, mean PCWP 16, PVR 6.2 WU, CI 2.23.  Given minimal change, Tyvaso was stopped and patient actually felt better.  6. Lexiscan Thallium in 11/11 with no evidence for ischemia or infarction.  7. Impaired fasting glucose. 8. Hypothyroidism 9. Obesity. 10. CKD 11. Rheumatoid arthritis 12. ABIs (11/14): Normal 13. Hyperlipidemia: Myalgias with Crestor.  14. Depression 15. Chronic diastolic CHF  SH: Widow, moved to [place] from [place] to live with sister.  Quit smoking in 1993.  Rare ETOH. Retired Optometrist.   FH: Brother with MI at 92, father with MI at 62.  ROS: All systems reviewed and negative except as per HPI.   Current Outpatient Prescriptions  Medication Sig Dispense Refill  . acetaminophen (TYLENOL) 500 MG tablet Take 1,000 mg by mouth daily as needed (  for arthritic pain).      Marland Kitchen albuterol (PROAIR HFA) 108 (90 BASE) MCG/ACT inhaler Inhale 2 puffs into the lungs every 4 (four) hours as needed for wheezing or shortness of breath.       Marland Kitchen apixaban (ELIQUIS) 5 MG TABS tablet Take 5 mg by mouth 2 (two) times daily.      . Benzocaine (ANBESOL MT) Use as directed 1 application in the mouth or throat 2 (two) times daily as needed (for dry mouth).      . bisoprolol (ZEBETA) 10 MG tablet TAKE 1 TABLET EVERY DAY  30 tablet  6  . calcium carbonate (TUMS - DOSED IN MG ELEMENTAL CALCIUM) 500 MG chewable tablet Chew 2 tablets by mouth  daily as needed for indigestion or heartburn.       . calcium-vitamin D (OSCAL WITH D) 500-200 MG-UNIT per tablet Take 1 tablet by mouth daily.      . cetirizine (ZYRTEC) 10 MG tablet Take 10 mg by mouth at bedtime.       . cholecalciferol (VITAMIN D) 1000 UNITS tablet Take 1,000 Units by mouth daily.      . Fluticasone-Salmeterol (ADVAIR) 250-50 MCG/DOSE AEPB Inhale 1 puff into the lungs 2 (two) times daily.       . furosemide (LASIX) 40 MG tablet Take 60 mg by mouth. 1 and 1/2 tablets(total 68m) two times a day      . ipratropium-albuterol (DUONEB) 0.5-2.5 (3) MG/3ML SOLN Take 3 mLs by nebulization every 4 (four) hours as needed.       .Marland KitchenKLOR-CON M20 20 MEQ tablet TAKE 3 TABLETS BY MOUTH IN THE MORNING AND 2 TABLETS IN THE EVENING  150 tablet  0  . leflunomide (ARAVA) 20 MG tablet Take 20 mg by mouth daily.      .Marland Kitchenlevothyroxine (SYNTHROID, LEVOTHROID) 50 MCG tablet Take 1 tablet (50 mcg total) by mouth daily before breakfast.  90 tablet  3  . Magnesium 400 MG CAPS Take 1 capsule by mouth daily.      . metFORMIN (GLUCOPHAGE) 500 MG tablet Take 1 tablet (500 mg total) by mouth 2 (two) times daily with a meal.  180 tablet  3  . montelukast (SINGULAIR) 10 MG tablet Take 10 mg by mouth at bedtime.      . Multiple Vitamins-Minerals (CENTRUM SILVER PO) Take 1 tablet by mouth daily.      . Polyvinyl Alcohol-Povidone (REFRESH OP) Place 1 drop into both eyes daily as needed (for dry eyes).      . potassium chloride SA (K-DUR,KLOR-CON) 20 MEQ tablet Take 40-60 mEq by mouth 2 (two) times daily. Take 60 mg by mouth in the morning and 40 mg by mouth in the evening.      . pravastatin (PRAVACHOL) 20 MG tablet TAKE 1 TABLET BY MOUTH EVERY EVENING  30 tablet  6  . prednisoLONE 5 MG TABS tablet Take 5 mg by mouth daily.      .Marland KitchenPRESCRIPTION MEDICATION Oxygen.   Pt uses 6-8 liters of oxygen daily.      . sertraline (ZOLOFT) 50 MG tablet TAKE 1 TABLET (50 MG TOTAL) BY MOUTH DAILY.  30 tablet  8  . tiotropium  (SPIRIVA) 18 MCG inhalation capsule Place 18 mcg into inhaler and inhale daily.      . traMADol (ULTRAM) 50 MG tablet Take 100 mg by mouth 3 times/day as needed-between meals & bedtime for moderate pain.       .Marland Kitchenzolpidem (AMBIEN)  5 MG tablet Take 5 mg by mouth daily as needed for sleep.       No current facility-administered medications for this encounter.    BP 98/62  Pulse 88  Resp 20  Wt 201 lb 8 oz (91.4 kg)  SpO2 93% General: NAD, obese.  Neck: JVP 8 cm, no thyromegaly or thyroid nodule.  Lungs: Distant breath sounds bilaterally wth crackles left base.  CV: Nondisplaced PMI.  Heart irregular S1/S2, no S3/S4, no murmur.  Trace ankle edema bilaterally.  No carotid bruit.   Abdomen: Soft, nontender, no hepatosplenomegaly, no distention.  Skin: Intact without lesions or rashes.  Neurologic: Alert and oriented x 3.  Psych: Normal affect. Extremities: No clubbing or cyanosis.    Assessment/Plan: 1. Atrial fibrillation: Chronic x years.  She would be unlikely to remain in NSR if cardioversion were attempted.  Plan rate control.  CHADSVASC = 3 at least.  No history of GI bleeding.  Continue apixaban.  Reasonable rate control with bisoprolol.  2. Pulmonary arterial HTN/cor pulmonale: Significant right heart failure.  Last echo with moderately dilated and dysfunctional RV and PA systolic pressure 65 mmHg, PA systolic pressure 89/37 on RHC in 6/15 with normal PCWP.  She had minimal improvement with Tyvaso (nearly identical RHC on and off Tyvaso) and actually feels considerably better off Tyvaso.  Therefore, PAH is likely WHO group III from COPD and OHS/OSA with minimal WHO group I contribution.   - I suspect pulmonary vasodilators are going to be of minimal use.  Will continue CPAP and oxygen during the day.  - Continue current Lasix.  3. CKD: Creatinine 1.5 when last checked, this is stable.    4. RA: Being treated (Dr. Amil Amen).  Continues to be limited by joint pain. 5. CHF: Primarily RV  failure.  PCWP was normal on recent RHC.    6. Depression: Improved mood with sertraline.    [name] 02/24/2014

## 2014-02-24 NOTE — Patient Instructions (Signed)
Your physician recommends that you schedule a follow-up appointment in: 3 months with labs (BMET,CBC)  Do the following things EVERYDAY: 1) Weigh yourself in the morning before breakfast. Write it down and keep it in a log. 2) Take your medicines as prescribed 3) Eat low salt foods-Limit salt (sodium) to 2000 mg per day.  4) Stay as active as you can everyday 5) Limit all fluids for the day to less than 2 liters 6)

## 2014-03-01 ENCOUNTER — Other Ambulatory Visit: Payer: Self-pay | Admitting: Emergency Medicine

## 2014-03-01 ENCOUNTER — Other Ambulatory Visit: Payer: Self-pay | Admitting: Cardiology

## 2014-03-30 ENCOUNTER — Other Ambulatory Visit: Payer: Self-pay | Admitting: Emergency Medicine

## 2014-03-30 ENCOUNTER — Other Ambulatory Visit: Payer: Self-pay | Admitting: Cardiology

## 2014-04-09 ENCOUNTER — Ambulatory Visit: Payer: Medicare Other

## 2014-04-27 ENCOUNTER — Other Ambulatory Visit: Payer: Self-pay | Admitting: Cardiology

## 2014-04-30 ENCOUNTER — Encounter: Payer: Self-pay | Admitting: Emergency Medicine

## 2014-04-30 ENCOUNTER — Ambulatory Visit (INDEPENDENT_AMBULATORY_CARE_PROVIDER_SITE_OTHER): Payer: Medicare Other | Admitting: Emergency Medicine

## 2014-04-30 VITALS — BP 122/68 | HR 94 | Ht 64.0 in | Wt 194.0 lb

## 2014-04-30 DIAGNOSIS — G4733 Obstructive sleep apnea (adult) (pediatric): Secondary | ICD-10-CM

## 2014-04-30 DIAGNOSIS — J438 Other emphysema: Secondary | ICD-10-CM

## 2014-04-30 DIAGNOSIS — I2789 Other specified pulmonary heart diseases: Secondary | ICD-10-CM

## 2014-04-30 DIAGNOSIS — I2721 Secondary pulmonary arterial hypertension: Secondary | ICD-10-CM

## 2014-04-30 DIAGNOSIS — Z9989 Dependence on other enabling machines and devices: Principal | ICD-10-CM

## 2014-04-30 NOTE — Patient Instructions (Signed)
We will perform a home sleep study Please continue your medications and oxygen as ordered We will speak with Dr Aundra Dubin about possibly starting an alternative medication for pulmonary hypertension.  Follow with Dr Lamonte Sakai in 1 month

## 2014-04-30 NOTE — Assessment & Plan Note (Signed)
Will perform a home sleep study to see if she still needs CPAP after her wt loss ( I suspect she does)

## 2014-04-30 NOTE — Assessment & Plan Note (Signed)
She may benefot from an alternative rx like opsumit. Will discuss this with Dr Aundra Dubin

## 2014-04-30 NOTE — Addendum Note (Signed)
Addended by: Maurice March on: 04/30/2014 12:41 PM   Modules accepted: Orders

## 2014-04-30 NOTE — Progress Notes (Signed)
Subjective:  Patient ID: Tracey Duncan, female    DOB: 1946-09-04, 68 y.o.   MRN: [id number] HPI 68 yo woman, former tobacco (47 pk-yrs), hx of OSA dx in 4/13 (on CPAP + O2), HTN, A fib (failed cardioversion), allergies. COPD dx in 2002, currently on Advair + Spiriva. Albuterol prn, uses it only . Since moving to [place] in last 6 months she has had more SOB. She is having occasional wheeze, no cough. No CP.  Significant hypoxemia on arrival. Needs a local PCP and cardiologist  ROV 10/30/12 -- COPD, OSA, allergies, A Fib, HTN. She was seen by Dr Aundra Dubin and started anticoagulation for her A fib. Also had TTE that showed PAH with PASP ~ 21mHg. She is wearing her O2 reliably - has a documented desat on O2 at Dr [facility]. She is ordered for 6L and 8L with exertion.  Currently on advair, spiriva. Singulair + zyrtec.   ROV 12/05/12 -- COPD, OSA, allergies, A Fib, HTN. Also secondary PAH and severe hypoxemia.  PFT today with moderately severe AFL, no BD response, decreased DLCO. A V/Q on 11/21/12 was normal. Her lasix has been adjusted by Dr MAundra Dubin She is using her oximizer reliably. She is trying to wear CPAP reliably, but still poor sleep - difficulty falling asleep and also staying asleep. Supposed to have ONO on CPAP 4/3.  ROV 01/16/13 --  COPD (FEV1 1.38L), OSA on CPAP, allergies, A Fib, HTN. Also secondary PAH and severe hypoxemia.  Returns for f/u. Reports that her wt has dropped another 8 lbs since our last visit,  breathing well. ONO on CPAP + 3L/min performed > shows 3 discrete episodes of desat that could represent REM-related desats (the report says 2, but it was really 3L/min). Unfortunately about a week ago she developed R UE, hand pain, L knee pain. No f/c, no medication changes or other infectious sx. She follows w [name].   PULMONARY FUNCTON TEST 12/05/2012  FVC 2.15  FEV1 1.38  FEV1/FVC 64.2  FVC  % Predicted 73  FEV % Predicted 65  FeF 25-75 .65  FeF 25-75 % Predicted 2.44       05/29/13 -- COPD, OSA, newly dx RA. Secondary PAH and hypoxemia. She was started on prednisone, now weaning and being converted to [place] She feels MUCH better, is more active. She is compliant with her oximizer. She wears her CPAP. Compliance data shows that she wears it, she states that it helps her clinically.  She is having more nasal drainage, cough with clear sputum.   09/26/13 -- COPD, OSA, newly dx RA. Secondary PAH and hypoxemia. She tells me that her breathing has been worse over the last several months. R heart cath (11/14): mean RA 8, PA 68/28 mean 45, mean PCWP 11, CI 2.29, PVR 7.2 WU. She was started on Tyvaso by Dr MAundra Dubin has titrated up to 9 puffs qid. She unfortunately feels worse not better, is now limited as to her activity, cannot walk any significant distance (a clear change). She also has now weaned off of prednisone and is on [place] which could be an influence here. She is on Advair and Spiriva.   ROV 11/13/13 -- COPD, OSA, RA, A Fib, secondary PAH and hypoxemia. She has been on Tyvaso since 12/17, 9 puffs since January. She is also on Arava and Pred 5. She feels that the last several months have been characterized by worsening dyspnea, some worsening edema > her lasix has been increased by Dr MAundra Dubinwith  some improvement in edema. Her rheumatolgist believes that she could be off pred from their standpoint. She remains on Spiriva + Advair. She remains on CPAP - but hasn't used it last several weeks due to cough. She has not had repeat R heart cath or TTE on Tyvaso yet. She is getting purulent mucous from her nose, resulting in cough. She is on zyrtec and sigulair.    ROV 04/30/14 -- COPD, OSA, RA, A Fib, secondary PAH and hypoxemia; she is managed with arava + pred. She is now off Tyvaso and she feels better off of it. She has some cough, nagging an prod of white. She has not been wearing her CPAP reliably - has lost 100 lbs since her PSG.     R heart cath 02/06/14 -- Final Conclusions:  RA pressure and PCWP look good, still with moderate pulmonary hypertension. This study really shows no significant change compared to the pre-Tyvaso RHC. I suspect that patient may have [name] primarily due to COPD and OHS/OSA rather than group I PAH.  Recommendations: Will have her hold Tyvaso and see if this makes her feel any worse. If not, will probably stop it.    Objective:   Physical Exam Filed Vitals:   04/30/14 1149  BP: 122/68  Pulse: 94  Height: _0  (1.626 m)  Weight: 194 lb (87.998 kg)  SpO2: 92%   Gen: Pleasant, obese, in no distress,  normal affect  ENT: No lesions,  mouth clear,  oropharynx clear, no postnasal drip  Neck: No JVD, no TMG, no carotid bruits  Lungs: No use of accessory muscles, clear without rales or rhonchi  Cardiovascular: RRR, heart sounds normal, no murmur or gallops, 1+ peripheral edema  Musculoskeletal: No deformities, no cyanosis or clubbing  Neuro: alert, non focal  Skin: Warm, no lesions or rashes   R heart cath 07/11/13  -- RA mean 8  RV 70/7  PA 68/28, mean 45  PCWP mean 11  Oxygen saturations (on 5L home oxygen):  PA 64%  AO 99%  Cardiac Output (Fick) 4.74  Cardiac Index (Fick) 2.29  PVR 7.2 WU   10/22/12 --  Study Conclusions - Left ventricle: The cavity size was normal. Wall thickness was normal. Systolic function was normal. The estimated ejection fraction was in the range of 55% to 60%. Wall motion was normal; there were no regional wall motion abnormalities. - Left atrium: The atrium was mildly dilated. - Right ventricle: The cavity size was moderately dilated. Systolic function was moderately reduced. - Right atrium: The atrium was moderately dilated. - Pulmonary arteries: Systolic pressure was moderately to severely increased. PA peak pressure: 64m Hg (S).      Assessment & Plan:  OSA on CPAP Will perform a home sleep study to see if she still needs CPAP after her wt loss ( I suspect she does)  COPD  (chronic obstructive pulmonary disease) Continue her BD and O2 regimen  Pulmonary arterial hypertension She may benefot from an alternative rx like opsumit. Will discuss this with Dr MAundra Dubin

## 2014-04-30 NOTE — Assessment & Plan Note (Signed)
Continue her BD and O2 regimen

## 2014-05-03 ENCOUNTER — Other Ambulatory Visit: Payer: Self-pay | Admitting: Cardiology

## 2014-05-07 ENCOUNTER — Other Ambulatory Visit: Payer: Self-pay | Admitting: Cardiology

## 2014-05-07 ENCOUNTER — Ambulatory Visit
Admission: RE | Admit: 2014-05-07 | Discharge: 2014-05-07 | Disposition: A | Discharge status: Home / Self Care | Payer: Medicare Other | Source: Ambulatory Visit | Attending: Internal Medicine | Admitting: Internal Medicine

## 2014-05-07 DIAGNOSIS — Z1239 Encounter for other screening for malignant neoplasm of breast: Secondary | ICD-10-CM

## 2014-05-09 ENCOUNTER — Other Ambulatory Visit: Payer: Self-pay | Admitting: Internal Medicine

## 2014-05-09 DIAGNOSIS — R928 Other abnormal and inconclusive findings on diagnostic imaging of breast: Secondary | ICD-10-CM

## 2014-05-21 ENCOUNTER — Ambulatory Visit
Admission: RE | Admit: 2014-05-21 | Discharge: 2014-05-21 | Disposition: A | Discharge status: Home / Self Care | Payer: Medicare Other | Source: Ambulatory Visit | Attending: Internal Medicine | Admitting: Internal Medicine

## 2014-05-21 DIAGNOSIS — R928 Other abnormal and inconclusive findings on diagnostic imaging of breast: Secondary | ICD-10-CM

## 2014-05-29 ENCOUNTER — Encounter: Payer: Self-pay | Admitting: Emergency Medicine

## 2014-05-29 ENCOUNTER — Ambulatory Visit (INDEPENDENT_AMBULATORY_CARE_PROVIDER_SITE_OTHER): Payer: Medicare Other | Admitting: Emergency Medicine

## 2014-05-29 VITALS — BP 148/78 | HR 94 | Temp 97.9°F | Ht 64.0 in | Wt 202.6 lb

## 2014-05-29 DIAGNOSIS — I2789 Other specified pulmonary heart diseases: Secondary | ICD-10-CM

## 2014-05-29 DIAGNOSIS — J438 Other emphysema: Secondary | ICD-10-CM

## 2014-05-29 DIAGNOSIS — Z9989 Dependence on other enabling machines and devices: Secondary | ICD-10-CM

## 2014-05-29 DIAGNOSIS — G4733 Obstructive sleep apnea (adult) (pediatric): Secondary | ICD-10-CM

## 2014-05-29 DIAGNOSIS — Z23 Encounter for immunization: Secondary | ICD-10-CM

## 2014-05-29 NOTE — Patient Instructions (Signed)
Continue your current medications Get your sleep study as planned Flu shot today We will give you the prevnar-13 pneumonia shot and the Tdap shot at a later date.  Follow with Dr Lamonte Sakai in 3 months or sooner if you have any problems.

## 2014-05-29 NOTE — Assessment & Plan Note (Signed)
Not currently on CPAP - she is scheduled for a split-night PSG; suspect she may still need CPAP

## 2014-05-29 NOTE — Assessment & Plan Note (Addendum)
Continue same regimen - Advair + Spiriva Flu shot today

## 2014-05-29 NOTE — Progress Notes (Signed)
Subjective:  Patient ID: Tracey Duncan, female    DOB: Jun 11, 1946, 68 y.o.   MRN: [id number] HPI 68 yo woman, former tobacco (31 pk-yrs), hx of OSA dx in 4/13 (on CPAP + O2), HTN, A fib (failed cardioversion), allergies. COPD dx in 2002, currently on Advair + Spiriva. Albuterol prn, uses it only . Since moving to [place] in last 6 months she has had more SOB. She is having occasional wheeze, no cough. No CP.  Significant hypoxemia on arrival. Needs a local PCP and cardiologist  ROV 10/30/12 -- COPD, OSA, allergies, A Fib, HTN. She was seen by Dr Aundra Dubin and started anticoagulation for her A fib. Also had TTE that showed PAH with PASP ~ 1mHg. She is wearing her O2 reliably - has a documented desat on O2 at Dr [facility]. She is ordered for 6L and 8L with exertion.  Currently on advair, spiriva. Singulair + zyrtec.   ROV 12/05/12 -- COPD, OSA, allergies, A Fib, HTN. Also secondary PAH and severe hypoxemia.  PFT today with moderately severe AFL, no BD response, decreased DLCO. A V/Q on 11/21/12 was normal. Her lasix has been adjusted by Dr MAundra Dubin She is using her oximizer reliably. She is trying to wear CPAP reliably, but still poor sleep - difficulty falling asleep and also staying asleep. Supposed to have ONO on CPAP 4/3.  ROV 01/16/13 --  COPD (FEV1 1.38L), OSA on CPAP, allergies, A Fib, HTN. Also secondary PAH and severe hypoxemia.  Returns for f/u. Reports that her wt has dropped another 8 lbs since our last visit,  breathing well. ONO on CPAP + 3L/min performed > shows 3 discrete episodes of desat that could represent REM-related desats (the report says 2, but it was really 3L/min). Unfortunately about a week ago she developed R UE, hand pain, L knee pain. No f/c, no medication changes or other infectious sx. She follows w [facility].   PULMONARY FUNCTON TEST 12/05/2012  FVC 2.15  FEV1 1.38  FEV1/FVC 64.2  FVC  % Predicted 73  FEV % Predicted 65  FeF 25-75 .65  FeF 25-75 % Predicted 2.44       05/29/13 -- COPD, OSA, newly dx RA. Secondary PAH and hypoxemia. She was started on prednisone, now weaning and being converted to [place] She feels MUCH better, is more active. She is compliant with her oximizer. She wears her CPAP. Compliance data shows that she wears it, she states that it helps her clinically.  She is having more nasal drainage, cough with clear sputum.   09/26/13 -- COPD, OSA, newly dx RA. Secondary PAH and hypoxemia. She tells me that her breathing has been worse over the last several months. R heart cath (11/14): mean RA 8, PA 68/28 mean 45, mean PCWP 11, CI 2.29, PVR 7.2 WU. She was started on Tyvaso by Dr MAundra Dubin has titrated up to 9 puffs qid. She unfortunately feels worse not better, is now limited as to her activity, cannot walk any significant distance (a clear change). She also has now weaned off of prednisone and is on [place] which could be an influence here. She is on Advair and Spiriva.   ROV 11/13/13 -- COPD, OSA, RA, A Fib, secondary PAH and hypoxemia. She has been on Tyvaso since 12/17, 9 puffs since January. She is also on Arava and Pred 5. She feels that the last several months have been characterized by worsening dyspnea, some worsening edema > her lasix has been increased by Dr MAundra Dubinwith  some improvement in edema. Her rheumatolgist believes that she could be off pred from their standpoint. She remains on Spiriva + Advair. She remains on CPAP - but hasn't used it last several weeks due to cough. She has not had repeat R heart cath or TTE on Tyvaso yet. She is getting purulent mucous from her nose, resulting in cough. She is on zyrtec and sigulair.    ROV 04/30/14 -- COPD, OSA, RA, A Fib, secondary PAH and hypoxemia; she is managed with arava + pred. She is now off Tyvaso and she feels better off of it. She has some cough, nagging an prod of white. She has not been wearing her CPAP reliably - has lost 100 lbs since her PSG.    R heart cath 02/06/14 -- Final Conclusions:  RA pressure and PCWP look good, still with moderate pulmonary hypertension. This study really shows no significant change compared to the pre-Tyvaso RHC. I suspect that patient may have [place] primarily due to COPD and OHS/OSA rather than group I PAH.  Recommendations: Will have her hold Tyvaso and see if this makes her feel any worse. If not, will probably stop it.   ROV 05/29/14 -- hx COPD, OSA, RA, A fib, hypoxemia and secondary PAH. Last time we ordered split night PSG, scheduled for 06/23/14. She is on high-flow O2. Feels well - has been active.  She asks today whether she can / should get Tdap immunization, prevnar-13    Objective:   Physical Exam Filed Vitals:   05/29/14 1112  BP: 148/78  Pulse: 94  Temp: 97.9 F (36.6 C)  TempSrc: Oral  Height: _0  (1.626 m)  Weight: 202 lb 9.6 oz (91.899 kg)  SpO2: 94%   Gen: Pleasant, obese, in no distress,  normal affect  ENT: No lesions,  mouth clear,  oropharynx clear, no postnasal drip  Neck: No JVD, no TMG, no carotid bruits  Lungs: No use of accessory muscles, clear without rales or rhonchi  Cardiovascular: RRR, heart sounds normal, no murmur or gallops, 1+ peripheral edema  Musculoskeletal: No deformities, no cyanosis or clubbing  Neuro: alert, non focal  Skin: Warm, no lesions or rashes   R heart cath 07/11/13  -- RA mean 8  RV 70/7  PA 68/28, mean 45  PCWP mean 11  Oxygen saturations (on 5L home oxygen):  PA 64%  AO 99%  Cardiac Output (Fick) 4.74  Cardiac Index (Fick) 2.29  PVR 7.2 WU   10/22/12 --  Study Conclusions - Left ventricle: The cavity size was normal. Wall thickness was normal. Systolic function was normal. The estimated ejection fraction was in the range of 55% to 60%. Wall motion was normal; there were no regional wall motion abnormalities. - Left atrium: The atrium was mildly dilated. - Right ventricle: The cavity size was moderately dilated. Systolic function was moderately reduced. - Right  atrium: The atrium was moderately dilated. - Pulmonary arteries: Systolic pressure was moderately to severely increased. PA peak pressure: 28m Hg (S).      Assessment & Plan:  COPD (chronic obstructive pulmonary disease) Continue same regimen - Advair + Spiriva Flu shot today  OSA Not currently on CPAP - she is scheduled for a split-night PSG; suspect she may still need CPAP  Secondary PAH Multifactorial - suspect contributions from her RA, COPD, OSA, A Fib, systemic HTN. Her PASP is high and she likely merits treatment once we get her OSA controlled (or prove that it is resolved w her wt  loss). Will speak to Dr Aundra Dubin about this after the PSG is done.

## 2014-05-29 NOTE — Assessment & Plan Note (Signed)
Multifactorial - suspect contributions from her RA, COPD, OSA, A Fib, systemic HTN. Her PASP is high and she likely merits treatment once we get her OSA controlled (or prove that it is resolved w her wt loss). Will speak to Dr Aundra Dubin about this after the PSG is done.

## 2014-05-31 ENCOUNTER — Other Ambulatory Visit: Payer: Self-pay | Admitting: Cardiology

## 2014-06-07 ENCOUNTER — Other Ambulatory Visit: Payer: Self-pay | Admitting: Cardiology

## 2014-06-09 ENCOUNTER — Telehealth: Payer: Self-pay | Admitting: Emergency Medicine

## 2014-06-09 NOTE — Telephone Encounter (Signed)
Called, spoke with pt -  C/o increased cough with white to yellow mucus since last OV with Dr. Lamonte Sakai on 05/29/14 and PND.  Has chills and sweats over the past few - hasn't taken temp and relates this to weather.  Denies wheezing, chest tightness/pain, increased SOB, sinus pressure, or HA.  Is going on vacation next wk.  Offered OV -- pt declines at this time and is requesting augmentin rx x 14 days.  Reports she was given this in March for same symptoms.  She has not taken any OTC meds.  RB, pls advise if you are ok with this or if OV is needed.  Thank you.

## 2014-06-09 NOTE — Telephone Encounter (Signed)
Per RB: pt will need OV.    Called, spoke with pt.  Appt scheduled to see TP on Thursday, Oct 8 at 10:30 am (date per pt's request).  Pt confirmed appt and is to call office back if this needs to be rescheduled or if anything needed prior to appt.  She verbalized understanding, is in agreement with this plan, and voiced no further questions or concerns at this time.

## 2014-06-12 ENCOUNTER — Ambulatory Visit: Payer: Medicare Other | Admitting: Adult Health

## 2014-06-17 ENCOUNTER — Telehealth: Payer: Self-pay | Admitting: Emergency Medicine

## 2014-06-17 DIAGNOSIS — J439 Emphysema, unspecified: Secondary | ICD-10-CM

## 2014-06-17 NOTE — Telephone Encounter (Signed)
Pt stated that she needs rx faxed to the mobility center in [place].  She will be purchasing oxygen from them and they need the rx on file so she can do this.   Pt will need this faxed to the company by Thursday.  RB please advise.  Thanks  No Known Allergies  Current Outpatient Prescriptions on File Prior to Visit  Medication Sig Dispense Refill  . acetaminophen (TYLENOL) 500 MG tablet Take 1,000 mg by mouth daily as needed (for arthritic pain).      . ADVAIR DISKUS 250-50 MCG/DOSE AEPB INHALE 1 PUFF INTO THE LUNGS EVERY 12 (TWELVE) HOURS.  60 each  5  . albuterol (PROAIR HFA) 108 (90 BASE) MCG/ACT inhaler Inhale 2 puffs into the lungs every 4 (four) hours as needed for wheezing or shortness of breath.       . Benzocaine (ANBESOL MT) Use as directed 1 application in the mouth or throat 2 (two) times daily as needed (for dry mouth).      . bisoprolol (ZEBETA) 10 MG tablet TAKE 1 TABLET EVERY DAY  30 tablet  6  . calcium carbonate (TUMS - DOSED IN MG ELEMENTAL CALCIUM) 500 MG chewable tablet Chew 2 tablets by mouth daily as needed for indigestion or heartburn.       . calcium-vitamin D (OSCAL WITH D) 500-200 MG-UNIT per tablet Take 1 tablet by mouth daily.      . cetirizine (ZYRTEC) 10 MG tablet Take 10 mg by mouth at bedtime.       . cholecalciferol (VITAMIN D) 1000 UNITS tablet Take 1,000 Units by mouth daily.      Marland Kitchen ELIQUIS 5 MG TABS tablet TAKE 1 TABLET BY MOUTH TWICE A DAY  60 tablet  0  . ELIQUIS 5 MG TABS tablet TAKE 1 TABLET BY MOUTH TWICE A DAY  60 tablet  0  . furosemide (LASIX) 40 MG tablet TAKE 1 & 1/2 TABLET BY MOUTH TWICE A DAY  90 tablet  3  . ipratropium-albuterol (DUONEB) 0.5-2.5 (3) MG/3ML SOLN Take 3 mLs by nebulization every 4 (four) hours as needed.       Marland Kitchen KLOR-CON M20 20 MEQ tablet TAKE 3 TABLETS BY MOUTH EVERY MORNING AND TAKE 2 TABLETS EVERY EVENING  150 tablet  0  . leflunomide (ARAVA) 20 MG tablet Take 20 mg by mouth daily.      Marland Kitchen levothyroxine (SYNTHROID,  LEVOTHROID) 50 MCG tablet Take 1 tablet (50 mcg total) by mouth daily before breakfast.  90 tablet  3  . Magnesium 400 MG CAPS Take 1 capsule by mouth daily.      . Melatonin 5 MG CAPS Take 1 capsule by mouth 3 times/day as needed-between meals & bedtime.       . metFORMIN (GLUCOPHAGE) 500 MG tablet Take 1 tablet (500 mg total) by mouth 2 (two) times daily with a meal.  180 tablet  3  . montelukast (SINGULAIR) 10 MG tablet Take 10 mg by mouth at bedtime.      . Multiple Vitamins-Minerals (CENTRUM SILVER PO) Take 1 tablet by mouth daily.      . Polyvinyl Alcohol-Povidone (REFRESH OP) Place 1 drop into both eyes daily as needed (for dry eyes).      . pravastatin (PRAVACHOL) 20 MG tablet TAKE 1 TABLET BY MOUTH EVERY EVENING  30 tablet  6  . prednisoLONE 5 MG TABS tablet Take 5 mg by mouth daily.      Marland Kitchen PRESCRIPTION MEDICATION Oxygen.  Pt uses 6-8 liters of oxygen daily.      . sertraline (ZOLOFT) 50 MG tablet TAKE 1 TABLET (50 MG TOTAL) BY MOUTH DAILY.  30 tablet  8  . SPIRIVA HANDIHALER 18 MCG inhalation capsule INHALE 1 CAPSULE VIA HANDIHALER ONCE DAILY AT THE SAME TIME EVERY DAY  30 capsule  3  . traMADol (ULTRAM) 50 MG tablet Take 100 mg by mouth 3 times/day as needed-between meals & bedtime for moderate pain.       Marland Kitchen zolpidem (AMBIEN) 5 MG tablet Take 5 mg by mouth daily as needed for sleep.       No current facility-administered medications on file prior to visit.

## 2014-06-17 NOTE — Telephone Encounter (Signed)
Ok to fax an order for whatever her current dose is (at rest and with exertion)

## 2014-06-18 NOTE — Telephone Encounter (Signed)
Spoke with patient & obtained phone number for [facility].  I spoke with Butch Penny _0  & she states she only needs script for pt faxed to them.  Order has been faxed to [facility] in [place], [place] [phone] ATTN: Butch Penny.  Butch Penny will let us know if she needs anything for this pt to obtain her [address]

## 2014-06-18 NOTE — Telephone Encounter (Signed)
Spoke with patient, verified liter flow with patient Patient needs O2 : 6Liters via [place] for rest; 8Liters via [place] for exertion and 3L bled into CPAP. [facility]ses [facility] in [place]. [phone]) [phone] ATTNButch Penny Pt aware that order placed.   Will send to [facility] to make aware that the patient needs this order sent ASAP, leaving for out of town 06/19/14 Nothing further needed.

## 2014-06-23 ENCOUNTER — Encounter (HOSPITAL_BASED_OUTPATIENT_CLINIC_OR_DEPARTMENT_OTHER): Payer: Medicare Other

## 2014-06-30 ENCOUNTER — Other Ambulatory Visit: Payer: Self-pay | Admitting: Cardiology

## 2014-06-30 ENCOUNTER — Other Ambulatory Visit: Payer: Self-pay | Admitting: Emergency Medicine

## 2014-07-03 ENCOUNTER — Encounter (HOSPITAL_COMMUNITY): Payer: Self-pay | Admitting: Cardiology

## 2014-07-05 ENCOUNTER — Other Ambulatory Visit: Payer: Self-pay | Admitting: Cardiology

## 2014-07-05 ENCOUNTER — Other Ambulatory Visit: Payer: Self-pay | Admitting: Emergency Medicine

## 2014-07-05 DIAGNOSIS — I5022 Chronic systolic (congestive) heart failure: Secondary | ICD-10-CM

## 2014-07-11 ENCOUNTER — Ambulatory Visit (HOSPITAL_BASED_OUTPATIENT_CLINIC_OR_DEPARTMENT_OTHER): Payer: Medicare Other | Attending: Pulmonary Disease

## 2014-07-11 DIAGNOSIS — Z79899 Other long term (current) drug therapy: Secondary | ICD-10-CM | POA: Diagnosis not present

## 2014-07-11 DIAGNOSIS — I10 Essential (primary) hypertension: Secondary | ICD-10-CM | POA: Diagnosis not present

## 2014-07-11 DIAGNOSIS — Z9989 Dependence on other enabling machines and devices: Secondary | ICD-10-CM

## 2014-07-11 DIAGNOSIS — J449 Chronic obstructive pulmonary disease, unspecified: Secondary | ICD-10-CM | POA: Insufficient documentation

## 2014-07-11 DIAGNOSIS — G4710 Hypersomnia, unspecified: Secondary | ICD-10-CM | POA: Insufficient documentation

## 2014-07-11 DIAGNOSIS — I4891 Unspecified atrial fibrillation: Secondary | ICD-10-CM | POA: Insufficient documentation

## 2014-07-11 DIAGNOSIS — Z7901 Long term (current) use of anticoagulants: Secondary | ICD-10-CM | POA: Diagnosis not present

## 2014-07-11 DIAGNOSIS — I272 Other secondary pulmonary hypertension: Secondary | ICD-10-CM | POA: Insufficient documentation

## 2014-07-11 DIAGNOSIS — Z9981 Dependence on supplemental oxygen: Secondary | ICD-10-CM | POA: Diagnosis not present

## 2014-07-11 DIAGNOSIS — G4733 Obstructive sleep apnea (adult) (pediatric): Secondary | ICD-10-CM

## 2014-07-13 ENCOUNTER — Other Ambulatory Visit: Payer: Self-pay | Admitting: Cardiology

## 2014-07-15 DIAGNOSIS — G4710 Hypersomnia, unspecified: Secondary | ICD-10-CM

## 2014-07-15 NOTE — Sleep Study (Signed)
[place]  NAME: Tracey Duncan DATE OF BIRTH:  1945/09/20 MEDICAL RECORD NUMBER [id number]  LOCATION: [facility]  PHYSICIAN: Chesley Mires, M.D. DATE OF STUDY: 07/11/2014  SLEEP STUDY TYPE: Nocturnal polysomnogram               REFERRING PHYSICIAN: Collene Gobble, MD  INDICATION FOR STUDY:  Tracey Duncan is a 68 y.o. female who presents to the sleep lab for evaluation of hypersomnia with obstructive sleep apnea.  She reports snoring, sleep disruption, apnea, and daytime sleepiness.  She has a history of COPD, pulmonary hypertension, atrial fibrillation and systemic hypertension.  She wears 3 liters oxygen.  She has sleep study 01/03/12 which showed moderate sleep apnea with an AHI of 15.9 and SaO2 low of 75%.  Of note is that her weight was listed at 290 lbs when she had sleep study in 2013.  EPWORTH SLEEPINESS SCORE: 2. HEIGHT: 5" 4"  WEIGHT: 197 lbs BMI: 34      NECK SIZE: 16 in.  MEDICATIONS:  Current Outpatient Prescriptions on File Prior to Visit  Medication Sig Dispense Refill  . acetaminophen (TYLENOL) 500 MG tablet Take 1,000 mg by mouth daily as needed (for arthritic pain).    . ADVAIR DISKUS 250-50 MCG/DOSE AEPB INHALE 1 PUFF INTO THE LUNGS EVERY 12 (TWELVE) HOURS. 60 each 5  . albuterol (PROAIR HFA) 108 (90 BASE) MCG/ACT inhaler Inhale 2 puffs into the lungs every 4 (four) hours as needed for wheezing or shortness of breath.     . Benzocaine (ANBESOL MT) Use as directed 1 application in the mouth or throat 2 (two) times daily as needed (for dry mouth).    . bisoprolol (ZEBETA) 10 MG tablet TAKE 1 TABLET EVERY DAY 30 tablet 6  . calcium carbonate (TUMS - DOSED IN MG ELEMENTAL CALCIUM) 500 MG chewable tablet Chew 2 tablets by mouth daily as needed for indigestion or heartburn.     . calcium-vitamin D (OSCAL WITH D) 500-200 MG-UNIT per tablet Take 1 tablet by mouth daily.    . cetirizine (ZYRTEC) 10 MG tablet Take 10 mg by mouth at  bedtime.     . cholecalciferol (VITAMIN D) 1000 UNITS tablet Take 1,000 Units by mouth daily.    Marland Kitchen ELIQUIS 5 MG TABS tablet TAKE 1 TABLET BY MOUTH TWICE A DAY 60 tablet 0  . ELIQUIS 5 MG TABS tablet TAKE 1 TABLET BY MOUTH TWICE A DAY **NEEDS APPOINTMENT** 60 tablet 3  . ELIQUIS 5 MG TABS tablet TAKE 1 TABLET BY MOUTH TWICE A DAY **NEEDS APPOINTMENT** 60 tablet 3  . furosemide (LASIX) 40 MG tablet TAKE 1 & 1/2 TABLET BY MOUTH TWICE A DAY 90 tablet 3  . furosemide (LASIX) 40 MG tablet TAKE 1 & 1/2 TABLET BY MOUTH TWICE A DAY 90 tablet 3  . ipratropium-albuterol (DUONEB) 0.5-2.5 (3) MG/3ML SOLN Take 3 mLs by nebulization every 4 (four) hours as needed.     Marland Kitchen KLOR-CON M20 20 MEQ tablet TAKE 3 TABLETS BY MOUTH EVERY MORNING AND TAKE 2 TABLETS EVERY EVENING 150 tablet 3  . leflunomide (ARAVA) 20 MG tablet Take 20 mg by mouth daily.    Marland Kitchen levothyroxine (SYNTHROID, LEVOTHROID) 50 MCG tablet Take 1 tablet (50 mcg total) by mouth daily before breakfast. 90 tablet 3  . Magnesium 400 MG CAPS Take 1 capsule by mouth daily.    . Melatonin 5 MG CAPS Take 1 capsule by mouth 3 times/day as  needed-between meals & bedtime.     . metFORMIN (GLUCOPHAGE) 500 MG tablet Take 1 tablet (500 mg total) by mouth 2 (two) times daily with a meal. 180 tablet 3  . montelukast (SINGULAIR) 10 MG tablet Take 10 mg by mouth at bedtime.    . montelukast (SINGULAIR) 10 MG tablet TAKE 1 TABLET BY MOUTH AT BEDTIME 30 tablet 6  . montelukast (SINGULAIR) 10 MG tablet TAKE 1 TABLET BY MOUTH AT BEDTIME 30 tablet 0  . Multiple Vitamins-Minerals (CENTRUM SILVER PO) Take 1 tablet by mouth daily.    . Polyvinyl Alcohol-Povidone (REFRESH OP) Place 1 drop into both eyes daily as needed (for dry eyes).    . pravastatin (PRAVACHOL) 20 MG tablet TAKE 1 TABLET BY MOUTH EVERY EVENING 30 tablet 6  . prednisoLONE 5 MG TABS tablet Take 5 mg by mouth daily.    Marland Kitchen PRESCRIPTION MEDICATION Oxygen.   Pt uses 6-8 liters of oxygen daily.    . sertraline  (ZOLOFT) 50 MG tablet TAKE 1 TABLET (50 MG TOTAL) BY MOUTH DAILY. 30 tablet 8  . SPIRIVA HANDIHALER 18 MCG inhalation capsule INHALE 1 CAPSULE VIA HANDIHALER ONCE DAILY AT THE SAME TIME EVERY DAY 1 capsule 3  . SPIRIVA HANDIHALER 18 MCG inhalation capsule INHALE 1 CAPSULE VIA HANDIHALER ONCE DAILY AT THE SAME TIME EVERY DAY 30 capsule 0  . traMADol (ULTRAM) 50 MG tablet Take 100 mg by mouth 3 times/day as needed-between meals & bedtime for moderate pain.     Marland Kitchen zolpidem (AMBIEN) 5 MG tablet Take 5 mg by mouth daily as needed for sleep.     No current facility-administered medications on file prior to visit.    SLEEP ARCHITECTURE:  Total recording time: 405.5 minutes.  Total sleep time was: 235.5 minutes.  Sleep efficiency: 58.1%.  Sleep latency: 54 minutes.  REM latency: N/A minutes.  Stage N1: 13.6%.  Stage N2: 86.4%.  Stage N3: 0%.  Stage R:  0%.  Supine sleep: 172.5 minutes.  Non-supine sleep: 63 minutes.  CARDIAC DATA:  Average heart rate: 84 beats per minute. Rhythm strip: atrial fibrillation with occasional PVC's.  RESPIRATORY DATA: Average respiratory rate: 16. Snoring: moderate. Average AHI: 0.8.   Apnea index: 0.  Hypopnea index: 0.8. Obstructive apnea index: 0.  Central apnea index: 0.  Mixed apnea index: 0. REM AHI: N/A.  NREM AHI: 0.8. Supine AHI: 0.2. Non-supine AHI: 1.  MOVEMENT/PARASOMNIA:  Periodic limb movement: 20.6.  Period limb movements with arousals: 7.4. Restroom trips: none.  OXYGEN DATA:  Baseline oxygenation: 83%. Lowest SaO2: 84%. Time spent below SaO2 88%: 148.5 minutes. Supplemental oxygen used: 3 liters.  The study was conducted with the patient using 3 liters supplemental oxygen (her baseline home oxygen use).  She had significant oxygen desaturation during sleep in the absence of other respiratory events.  IMPRESSION/ RECOMMENDATION:   While she did have a few apneic respiratory events, these were not frequent enough to qualify for a diagnosis  of sleep apnea.  Of note is that she has lost almost 100 lbs since her previous sleep study in 2013.  This weight loss could account for improvement in her sleep apnea.  She had significant oxygen desaturation during sleep in spite of being on 3 liters supplemental oxygen.  She will need to have further adjustment to her home oxygen set up.  This is likely related to her COPD and associated hypoxia.  She had an increase in her periodic limb movement index.  Clinically correlate to  determine the significance of this.  Chesley Mires, M.D. Diplomate, Tax adviser of Sleep Medicine  ELECTRONICALLY SIGNED ON:  07/15/2014, 8:31 AM [place] PH: [phone]   FX: [phone] [place]

## 2014-07-17 ENCOUNTER — Ambulatory Visit (HOSPITAL_COMMUNITY)
Admission: RE | Admit: 2014-07-17 | Discharge: 2014-07-17 | Disposition: A | Discharge status: Home / Self Care | Payer: Medicare Other | Source: Ambulatory Visit | Attending: Cardiology | Admitting: Cardiology

## 2014-07-17 VITALS — BP 118/62 | HR 88 | Wt 200.0 lb

## 2014-07-17 DIAGNOSIS — E785 Hyperlipidemia, unspecified: Secondary | ICD-10-CM | POA: Insufficient documentation

## 2014-07-17 DIAGNOSIS — E039 Hypothyroidism, unspecified: Secondary | ICD-10-CM | POA: Diagnosis not present

## 2014-07-17 DIAGNOSIS — M069 Rheumatoid arthritis, unspecified: Secondary | ICD-10-CM | POA: Insufficient documentation

## 2014-07-17 DIAGNOSIS — I482 Chronic atrial fibrillation, unspecified: Secondary | ICD-10-CM

## 2014-07-17 DIAGNOSIS — F329 Major depressive disorder, single episode, unspecified: Secondary | ICD-10-CM | POA: Diagnosis not present

## 2014-07-17 DIAGNOSIS — N189 Chronic kidney disease, unspecified: Secondary | ICD-10-CM | POA: Insufficient documentation

## 2014-07-17 DIAGNOSIS — Z7901 Long term (current) use of anticoagulants: Secondary | ICD-10-CM | POA: Insufficient documentation

## 2014-07-17 DIAGNOSIS — I5032 Chronic diastolic (congestive) heart failure: Secondary | ICD-10-CM | POA: Diagnosis not present

## 2014-07-17 DIAGNOSIS — I270 Primary pulmonary hypertension: Secondary | ICD-10-CM | POA: Insufficient documentation

## 2014-07-17 DIAGNOSIS — Z8249 Family history of ischemic heart disease and other diseases of the circulatory system: Secondary | ICD-10-CM | POA: Diagnosis not present

## 2014-07-17 DIAGNOSIS — J438 Other emphysema: Secondary | ICD-10-CM | POA: Diagnosis not present

## 2014-07-17 DIAGNOSIS — Z79899 Other long term (current) drug therapy: Secondary | ICD-10-CM | POA: Insufficient documentation

## 2014-07-17 DIAGNOSIS — E662 Morbid (severe) obesity with alveolar hypoventilation: Secondary | ICD-10-CM | POA: Diagnosis not present

## 2014-07-17 DIAGNOSIS — I272 Pulmonary hypertension, unspecified: Secondary | ICD-10-CM | POA: Insufficient documentation

## 2014-07-17 LAB — CBC WITH DIFFERENTIAL/PLATELET
Basophils Absolute: 0 10*3/uL (ref 0.0–0.1)
Basophils Relative: 0 % (ref 0–1)
Eosinophils Absolute: 0.2 10*3/uL (ref 0.0–0.7)
Eosinophils Relative: 2 % (ref 0–5)
HCT: 34.9 % — ABNORMAL LOW (ref 36.0–46.0)
HEMOGLOBIN: 10.8 g/dL — AB (ref 12.0–15.0)
LYMPHS ABS: 0.8 10*3/uL (ref 0.7–4.0)
LYMPHS PCT: 7 % — AB (ref 12–46)
MCH: 27 pg (ref 26.0–34.0)
MCHC: 30.9 g/dL (ref 30.0–36.0)
MCV: 87.3 fL (ref 78.0–100.0)
MONOS PCT: 8 % (ref 3–12)
Monocytes Absolute: 0.9 10*3/uL (ref 0.1–1.0)
NEUTROS ABS: 9.4 10*3/uL — AB (ref 1.7–7.7)
NEUTROS PCT: 83 % — AB (ref 43–77)
PLATELETS: 219 10*3/uL (ref 150–400)
RBC: 4 MIL/uL (ref 3.87–5.11)
RDW: 16.4 % — ABNORMAL HIGH (ref 11.5–15.5)
WBC: 11.4 10*3/uL — AB (ref 4.0–10.5)

## 2014-07-17 LAB — BASIC METABOLIC PANEL
ANION GAP: 15 (ref 5–15)
BUN: 32 mg/dL — ABNORMAL HIGH (ref 6–23)
CHLORIDE: 100 meq/L (ref 96–112)
CO2: 27 mEq/L (ref 19–32)
Calcium: 9.4 mg/dL (ref 8.4–10.5)
Creatinine, Ser: 1.57 mg/dL — ABNORMAL HIGH (ref 0.50–1.10)
GFR, EST AFRICAN AMERICAN: 38 mL/min — AB (ref >90)
GFR, EST NON AFRICAN AMERICAN: 33 mL/min — AB (ref >90)
Glucose, Bld: 167 mg/dL — ABNORMAL HIGH (ref 70–99)
POTASSIUM: 4.6 meq/L (ref 3.7–5.3)
SODIUM: 142 meq/L (ref 137–147)

## 2014-07-17 LAB — PRO B NATRIURETIC PEPTIDE: Pro B Natriuretic peptide (BNP): 4661 pg/mL — ABNORMAL HIGH (ref 0–125)

## 2014-07-17 MED ORDER — MACITENTAN 10 MG PO TABS: 10.0000 mg | ORAL_TABLET | Freq: Every day | ORAL | Status: DC

## 2014-07-17 NOTE — Progress Notes (Signed)
6MW completed during office visit. O2 sats ranged 93-95 % on 8L of oxygen HR ranged 98-117 Pt able to ambulate 400 feet (121.9 meters)   Pre walk HR 98 o2 sat 93% on 8 L of o2 Post walk HR 114 o2 sat 94% on 8L of o2

## 2014-07-17 NOTE — Progress Notes (Signed)
Patient ID: Tracey Duncan, female   DOB: 09-01-1946, 68 y.o.   MRN: [id number] PCP: Baity  68 yo with history of COPD on home oxygen, OHS/OSA, rheumatoid arthritis, and chronic atrial fibrillation presents for cardiology followup.  She was diagnosed with atrial fibrillation around 2004. She failed cardioversion and it has been chronic.  She has noted exertional dyspnea since at least 2012.  She was diagnosed with COPD back in 2002.  She no longer smokes.  She had been on CPAP for OSA prior, but was started on home oxygen all the time by Dr. Lamonte Sakai.  No tachypalpitations, no chest pain.  Echo in 2/14 showed a moderately dilated RV with moderately decreased systolic function, PA systolic pressure 65 mmHg. She was started on apixaban.   V/Q scan was negative for chronic PE.  She has been diagnosed with rheumatoid arthritis and is on [name].  I did a right heart cath in 11/14 that confirmed moderate to severe PAH with PVR 7.4 WU.  In 12/14, I started her on Tyvaso.   After last appointment, I did a repeat [place] in 6/15.  This was nearly identical to the prior study in 11/14. Given apparent minimal response to Tyvaso, I had her try coming off the medication.  Off Tyvaso, she feels that her breathing is definitely better. She is not short of breath walking around the house with her walker.  She can walk several hundred feet with her walker.  No chest pain.  No orthopnea/PND.  No lightheadedness/syncope. Weight is stable.   Sleep study in 11/15 did not show significant sleep apnea.   6 minute walk today 122 m (400 ft).   Labs (4/14): RF < 10, ANA negative, BNP 294, K 3.8, creatinine 1.7  Labs (9/14): K 4.5, creatinine 1.74 Labs (11/14): K 4.5=>3.9, creatinine 1.6, BNP 300 Labs (2/15): K 4.3, creatinine 1.6=>1.5, BNP 383=>311, LDL 72, HDL 49 Labs (3/15): K 4.2, creatinine 1.5 Labs (5/15): K 4.6, creatinine 1.5 Labs (6/15): HCT 32.1, TSH normal, HCT 32.1  PMH: 1. OSA/OHS: On CPAP at night and oxygen during the  day. 2. COPD: Diagnosed 2002.  FEV1 65% predicted in 4/14.  3. HTN 4. Chronic atrial fibrillation: Failed DCCV in 12/04.   5. Pulmonary HTN/cor pulmonale: Suspect this partly due to COPD and OHS/OSA (group 3) but cannot rule out group 1 component (has RA).  Echo (2/14) with EF 55-60%, moderately dilated RV with moderately decreased systolic function, PA systolic pressure 65 mmHg.  V/Q scan in 3/14 was negative for chronic PE. PFTs (4/14): FEV1 65% predicted, mild obstructive defect with severely decreased DLCO.  RHC (11/14): mean RA 8, PA 68/28 mean 45, mean PCWP 11, CI 2.29, PVR 7.2 WU. 6 minute walk (2/15) 126 meters.  6 minute walk (3/15) 118 m.  She started Tyvaso.  Repeat RHC in 6/15 on Tyvaso with mean RA 7, PA 65/28 mean 43, mean PCWP 16, PVR 6.2 WU, CI 2.23.  Given minimal change, Tyvaso was stopped and patient actually felt better.  6 minute walk (11/15) 122 m.  6. Lexiscan Thallium in 11/11 with no evidence for ischemia or infarction.  7. Impaired fasting glucose. 8. Hypothyroidism 9. Obesity. 10. CKD 11. Rheumatoid arthritis 12. ABIs (11/14): Normal 13. Hyperlipidemia: Myalgias with Crestor.  14. Depression 15. Chronic diastolic CHF 16. Sleep study (11/15) did not show significant OSA.   SH: Widow, moved to [place] from [place] to live with sister.  Quit smoking in 1993.  Rare ETOH. Retired  accountant.   FH: Brother with MI at 74, father with MI at 45.  ROS: All systems reviewed and negative except as per HPI.   Current Outpatient Prescriptions  Medication Sig Dispense Refill  . acetaminophen (TYLENOL) 500 MG tablet Take 1,000 mg by mouth daily as needed (for arthritic pain).    . ADVAIR DISKUS 250-50 MCG/DOSE AEPB INHALE 1 PUFF INTO THE LUNGS EVERY 12 (TWELVE) HOURS. 60 each 5  . albuterol (PROAIR HFA) 108 (90 BASE) MCG/ACT inhaler Inhale 2 puffs into the lungs every 4 (four) hours as needed for wheezing or shortness of breath.     . Benzocaine (ANBESOL MT) Use as directed 1  application in the mouth or throat 2 (two) times daily as needed (for dry mouth).    . bisoprolol (ZEBETA) 10 MG tablet TAKE 1 TABLET EVERY DAY 30 tablet 6  . calcium carbonate (TUMS - DOSED IN MG ELEMENTAL CALCIUM) 500 MG chewable tablet Chew 2 tablets by mouth daily as needed for indigestion or heartburn.     . calcium-vitamin D (OSCAL WITH D) 500-200 MG-UNIT per tablet Take 1 tablet by mouth daily.    . cetirizine (ZYRTEC) 10 MG tablet Take 10 mg by mouth at bedtime.     . cholecalciferol (VITAMIN D) 1000 UNITS tablet Take 1,000 Units by mouth daily.    . Colchicine 0.6 MG CAPS Take 1 capsule by mouth daily as needed.    Marland Kitchen ELIQUIS 5 MG TABS tablet TAKE 1 TABLET BY MOUTH TWICE A DAY 60 tablet 0  . febuxostat (ULORIC) 40 MG tablet Take 40 mg by mouth daily.    . furosemide (LASIX) 40 MG tablet TAKE 1 & 1/2 TABLET BY MOUTH TWICE A DAY 90 tablet 3  . ipratropium-albuterol (DUONEB) 0.5-2.5 (3) MG/3ML SOLN Take 3 mLs by nebulization every 4 (four) hours as needed.     Marland Kitchen KLOR-CON M20 20 MEQ tablet TAKE 3 TABLETS BY MOUTH EVERY MORNING AND TAKE 2 TABLETS EVERY EVENING 150 tablet 3  . leflunomide (ARAVA) 20 MG tablet Take 20 mg by mouth daily.    Marland Kitchen levothyroxine (SYNTHROID, LEVOTHROID) 50 MCG tablet Take 1 tablet (50 mcg total) by mouth daily before breakfast. 90 tablet 3  . Magnesium 400 MG CAPS Take 1 capsule by mouth daily.    . Melatonin 5 MG CAPS Take 1 capsule by mouth 3 times/day as needed-between meals & bedtime.     . metFORMIN (GLUCOPHAGE) 500 MG tablet Take 1 tablet (500 mg total) by mouth 2 (two) times daily with a meal. 180 tablet 3  . montelukast (SINGULAIR) 10 MG tablet Take 10 mg by mouth at bedtime.    . Multiple Vitamins-Minerals (CENTRUM SILVER PO) Take 1 tablet by mouth daily.    . Polyvinyl Alcohol-Povidone (REFRESH OP) Place 1 drop into both eyes daily as needed (for dry eyes).    . pravastatin (PRAVACHOL) 20 MG tablet TAKE 1 TABLET BY MOUTH EVERY EVENING 30 tablet 6  .  prednisoLONE 5 MG TABS tablet Take 5 mg by mouth daily.    Marland Kitchen PRESCRIPTION MEDICATION Oxygen.   Pt uses 6-8 liters of oxygen daily.    . sertraline (ZOLOFT) 50 MG tablet TAKE 1 TABLET (50 MG TOTAL) BY MOUTH DAILY. 30 tablet 8  . SPIRIVA HANDIHALER 18 MCG inhalation capsule INHALE 1 CAPSULE VIA HANDIHALER ONCE DAILY AT THE SAME TIME EVERY DAY 1 capsule 3  . traMADol (ULTRAM) 50 MG tablet Take 100 mg by mouth 3 times/day as needed-between  meals & bedtime for moderate pain.     Marland Kitchen zolpidem (AMBIEN) 5 MG tablet Take 5 mg by mouth daily as needed for sleep.    . Macitentan (OPSUMIT) 10 MG TABS Take 10 mg by mouth daily. 30 tablet 6   No current facility-administered medications for this encounter.    BP 118/62 mmHg  Pulse 88  Wt 200 lb (90.719 kg)  SpO2 90% General: NAD, obese.  Neck: JVP 7 cm, no thyromegaly or thyroid nodule.  Lungs: Distant breath sounds bilaterally wth crackles left base.  CV: Nondisplaced PMI.  Heart irregular S1/S2, no S3/S4, no murmur.  No edema.  No carotid bruit.   Abdomen: Soft, nontender, no hepatosplenomegaly, no distention.  Skin: Intact without lesions or rashes.  Neurologic: Alert and oriented x 3.  Psych: Normal affect. Extremities: No clubbing or cyanosis.    Assessment/Plan: 1. Atrial fibrillation: Chronic x years.  She would be unlikely to remain in NSR if cardioversion were attempted.  Plan rate control.  CHADSVASC = 3 at least.  No history of GI bleeding.  Continue apixaban.  Reasonable rate control with bisoprolol.  Check BMET/CBC today.  2. Pulmonary arterial HTN/cor pulmonale: Significant right heart failure.  Last echo with moderately dilated and dysfunctional RV and PA systolic pressure 65 mmHg, PA systolic pressure 67/42 on RHC in 6/15 with normal PCWP.  She had minimal improvement with Tyvaso (nearly identical RHC on and off Tyvaso) and actually feels considerably better off Tyvaso.  Patient has a group 3 component to her PAH (OHS/OSA and COPD), but  cannot rule out a group 1 contribution that could be related to collagen vascular disease (rheumatoid arthritis).  6 minute walk today was poor but stable compared to the past.   - Continue home oxygen.  - Continue current Lasix.  - I am going to try her on macitentan 10 mg daily.  I will see her back in 2 months with repeat 6 minute walk.  - BNP today.  3. CKD: Creatinine 1.5 when last checked, this is stable.    4. RA: Being treated (Dr. Amil Amen).  Continues to be limited by joint pain. 5. CHF: Primarily RV failure.  PCWP was normal on recent RHC.    6. Depression: Improved mood with sertraline.    [name] 07/17/2014

## 2014-07-17 NOTE — Patient Instructions (Addendum)
START Opsumit 10 mg daily  Labs today  Your physician recommends that you schedule a follow-up appointment in: 2 months with MD  Do the following things EVERYDAY: 1) Weigh yourself in the morning before breakfast. Write it down and keep it in a log. 2) Take your medicines as prescribed 3) Eat low salt foods-Limit salt (sodium) to 2000 mg per day.  4) Stay as active as you can everyday 5) Limit all fluids for the day to less than 2 liters 6)

## 2014-07-23 ENCOUNTER — Telehealth: Payer: Self-pay | Admitting: Emergency Medicine

## 2014-07-23 NOTE — Telephone Encounter (Signed)
Called pt. appt scheduled for 08/08/14 for 3 month follow up Pt had sleep study done 07/15/14 and is requesting results.  Please advise RB thanks

## 2014-07-25 NOTE — Telephone Encounter (Signed)
Please let her know that she did not show any apneic events, but she did have low oxygen levels through the night. She will need to continue to wear oxygen at night and we may need to consider increasing the flow rate. She also had some disruptive limb movements  - we will need to discuss both of these issues at her ROV

## 2014-07-25 NOTE — Telephone Encounter (Signed)
Pt aware of results and will keep OV with RB to discuss in further detail. Nothing more needed at this time.

## 2014-08-04 ENCOUNTER — Other Ambulatory Visit: Payer: Self-pay | Admitting: Cardiology

## 2014-08-08 ENCOUNTER — Ambulatory Visit (INDEPENDENT_AMBULATORY_CARE_PROVIDER_SITE_OTHER): Payer: Medicare Other | Admitting: Emergency Medicine

## 2014-08-08 ENCOUNTER — Encounter: Payer: Self-pay | Admitting: Emergency Medicine

## 2014-08-08 VITALS — BP 110/78 | HR 89 | Ht 64.0 in | Wt 204.0 lb

## 2014-08-08 DIAGNOSIS — Z9989 Dependence on other enabling machines and devices: Secondary | ICD-10-CM

## 2014-08-08 DIAGNOSIS — I272 Pulmonary hypertension, unspecified: Secondary | ICD-10-CM

## 2014-08-08 DIAGNOSIS — G4733 Obstructive sleep apnea (adult) (pediatric): Secondary | ICD-10-CM

## 2014-08-08 DIAGNOSIS — J209 Acute bronchitis, unspecified: Secondary | ICD-10-CM

## 2014-08-08 MED ORDER — DOXYCYCLINE HYCLATE 100 MG PO TABS: 100.0000 mg | ORAL_TABLET | Freq: Two times a day (BID) | ORAL | Status: DC

## 2014-08-08 NOTE — Assessment & Plan Note (Signed)
Continue current meds. We should be able to assess opsumit for effectiveness in a couple months.

## 2014-08-08 NOTE — Assessment & Plan Note (Signed)
Appears to be in setting of viral URI.  - will treat for 7 days w doxy, shouldn't interact with her other meds.

## 2014-08-08 NOTE — Assessment & Plan Note (Signed)
Resolved based on most recent PSG. She does need to use O2 at night

## 2014-08-08 NOTE — Progress Notes (Signed)
Subjective:  Patient ID: Tracey Duncan, female    DOB: Jun 11, 1946, 68 y.o.   MRN: [id number] HPI 68 yo woman, former tobacco (31 pk-yrs), hx of OSA dx in 4/13 (on CPAP + O2), HTN, A fib (failed cardioversion), allergies. COPD dx in 2002, currently on Advair + Spiriva. Albuterol prn, uses it only . Since moving to [place] in last 6 months she has had more SOB. She is having occasional wheeze, no cough. No CP.  Significant hypoxemia on arrival. Needs a local PCP and cardiologist  ROV 10/30/12 -- COPD, OSA, allergies, A Fib, HTN. She was seen by Dr Aundra Dubin and started anticoagulation for her A fib. Also had TTE that showed PAH with PASP ~ 1mHg. She is wearing her O2 reliably - has a documented desat on O2 at Dr MClaris Gladdenoffice. She is ordered for 6L and 8L with exertion.  Currently on advair, spiriva. Singulair + zyrtec.   ROV 12/05/12 -- COPD, OSA, allergies, A Fib, HTN. Also secondary PAH and severe hypoxemia.  PFT today with moderately severe AFL, no BD response, decreased DLCO. A V/Q on 11/21/12 was normal. Her lasix has been adjusted by Dr MAundra Dubin She is using her oximizer reliably. She is trying to wear CPAP reliably, but still poor sleep - difficulty falling asleep and also staying asleep. Supposed to have ONO on CPAP 4/3.  ROV 01/16/13 --  COPD (FEV1 1.38L), OSA on CPAP, allergies, A Fib, HTN. Also secondary PAH and severe hypoxemia.  Returns for f/u. Reports that her wt has dropped another 8 lbs since our last visit,  breathing well. ONO on CPAP + 3L/min performed > shows 3 discrete episodes of desat that could represent REM-related desats (the report says 2, but it was really 3L/min). Unfortunately about a week ago she developed R UE, hand pain, L knee pain. No f/c, no medication changes or other infectious sx. She follows w [facility].   PULMONARY FUNCTON TEST 12/05/2012  FVC 2.15  FEV1 1.38  FEV1/FVC 64.2  FVC  % Predicted 73  FEV % Predicted 65  FeF 25-75 .65  FeF 25-75 % Predicted 2.44       05/29/13 -- COPD, OSA, newly dx RA. Secondary PAH and hypoxemia. She was started on prednisone, now weaning and being converted to [place] She feels MUCH better, is more active. She is compliant with her oximizer. She wears her CPAP. Compliance data shows that she wears it, she states that it helps her clinically.  She is having more nasal drainage, cough with clear sputum.   09/26/13 -- COPD, OSA, newly dx RA. Secondary PAH and hypoxemia. She tells me that her breathing has been worse over the last several months. R heart cath (11/14): mean RA 8, PA 68/28 mean 45, mean PCWP 11, CI 2.29, PVR 7.2 WU. She was started on Tyvaso by Dr MAundra Dubin has titrated up to 9 puffs qid. She unfortunately feels worse not better, is now limited as to her activity, cannot walk any significant distance (a clear change). She also has now weaned off of prednisone and is on [place] which could be an influence here. She is on Advair and Spiriva.   ROV 11/13/13 -- COPD, OSA, RA, A Fib, secondary PAH and hypoxemia. She has been on Tyvaso since 12/17, 9 puffs since January. She is also on Arava and Pred 5. She feels that the last several months have been characterized by worsening dyspnea, some worsening edema > her lasix has been increased by Dr MAundra Dubinwith  some improvement in edema. Her rheumatolgist believes that she could be off pred from their standpoint. She remains on Spiriva + Advair. She remains on CPAP - but hasn't used it last several weeks due to cough. She has not had repeat R heart cath or TTE on Tyvaso yet. She is getting purulent mucous from her nose, resulting in cough. She is on zyrtec and sigulair.    ROV 04/30/14 -- COPD, OSA, RA, A Fib, secondary PAH and hypoxemia; she is managed with arava + pred. She is now off Tyvaso and she feels better off of it. She has some cough, nagging an prod of white. She has not been wearing her CPAP reliably - has lost 100 lbs since her PSG.    R heart cath 02/06/14 -- Final Conclusions:  RA pressure and PCWP look good, still with moderate pulmonary hypertension. This study really shows no significant change compared to the pre-Tyvaso RHC. I suspect that patient may have [place] primarily due to COPD and OHS/OSA rather than group I PAH.  Recommendations: Will have her hold Tyvaso and see if this makes her feel any worse. If not, will probably stop it.   ROV 05/29/14 -- hx COPD, OSA, RA, A fib, hypoxemia and secondary PAH. Last time we ordered split night PSG, scheduled for 06/23/14. She is on high-flow O2. Feels well - has been active.  She asks today whether she can / should get Tdap immunization, prevnar-13  ROV 08/08/14 -- hx COPD, OSA, RA, A fib, hypoxemia and secondary PAH on opsumit. Her PSG showed hypoxemia without obstructive events, evidence for limb movement disorder.  For the last 5 days she has had increasing congestion, mucous from head and chest. No clear fever.  We discussed her limb movement - she wants to hold off on meds for this now.     Objective:   Physical Exam Filed Vitals:   08/08/14 1532  BP: 110/78  Pulse: 89  Height: _0  (1.626 m)  Weight: 204 lb (92.534 kg)  SpO2: 91%   Gen: Pleasant, obese, in no distress,  normal affect  ENT: No lesions,  mouth clear,  oropharynx clear, no postnasal drip  Neck: No JVD, no TMG, no carotid bruits  Lungs: No use of accessory muscles, clear without rales or rhonchi  Cardiovascular: RRR, heart sounds normal, no murmur or gallops, 1+ peripheral edema  Musculoskeletal: No deformities, no cyanosis or clubbing  Neuro: alert, non focal  Skin: Warm, no lesions or rashes   R heart cath 07/11/13  -- RA mean 8  RV 70/7  PA 68/28, mean 45  PCWP mean 11  Oxygen saturations (on 5L home oxygen):  PA 64%  AO 99%  Cardiac Output (Fick) 4.74  Cardiac Index (Fick) 2.29  PVR 7.2 WU   10/22/12 --  Study Conclusions - Left ventricle: The cavity size was normal. Wall thickness was normal. Systolic function was normal.  The estimated ejection fraction was in the range of 55% to 60%. Wall motion was normal; there were no regional wall motion abnormalities. - Left atrium: The atrium was mildly dilated. - Right ventricle: The cavity size was moderately dilated. Systolic function was moderately reduced. - Right atrium: The atrium was moderately dilated. - Pulmonary arteries: Systolic pressure was moderately to severely increased. PA peak pressure: 91m Hg (S).      Assessment & Plan:  Pulmonary hypertension Continue current meds. We should be able to assess opsumit for effectiveness in a couple months.   OSA  Resolved based on most recent PSG. She does need to use O2 at night

## 2014-08-08 NOTE — Patient Instructions (Addendum)
Please take doxycycline 140m twice a day for 7 days Stay on all of your other medications as you have been taking them We can revisit your improvement on the opsumit at our next visit We will defer starting a medication for restless leg syndrome at this time.  Follow with Dr BLamonte Sakaiin 2 months or sooner if you have any problems.

## 2014-08-11 ENCOUNTER — Ambulatory Visit: Payer: Medicare Other | Admitting: Internal Medicine

## 2014-08-14 ENCOUNTER — Encounter (HOSPITAL_COMMUNITY): Payer: Self-pay | Admitting: Cardiology

## 2014-08-20 ENCOUNTER — Other Ambulatory Visit (INDEPENDENT_AMBULATORY_CARE_PROVIDER_SITE_OTHER): Payer: Medicare Other

## 2014-08-20 ENCOUNTER — Ambulatory Visit (INDEPENDENT_AMBULATORY_CARE_PROVIDER_SITE_OTHER): Payer: Medicare Other | Admitting: Internal Medicine

## 2014-08-20 ENCOUNTER — Encounter: Payer: Self-pay | Admitting: Internal Medicine

## 2014-08-20 VITALS — BP 120/60 | HR 93 | Temp 97.5°F | Ht 64.0 in | Wt 201.0 lb

## 2014-08-20 DIAGNOSIS — R7309 Other abnormal glucose: Secondary | ICD-10-CM

## 2014-08-20 DIAGNOSIS — Z23 Encounter for immunization: Secondary | ICD-10-CM

## 2014-08-20 DIAGNOSIS — R7303 Prediabetes: Secondary | ICD-10-CM

## 2014-08-20 DIAGNOSIS — E039 Hypothyroidism, unspecified: Secondary | ICD-10-CM

## 2014-08-20 DIAGNOSIS — G2581 Restless legs syndrome: Secondary | ICD-10-CM

## 2014-08-20 HISTORY — DX: Restless legs syndrome: G25.81

## 2014-08-20 LAB — TSH: TSH: 3.72 u[IU]/mL (ref 0.35–4.50)

## 2014-08-20 LAB — HEMOGLOBIN A1C: Hgb A1c MFr Bld: 6 % (ref 4.6–6.5)

## 2014-08-20 NOTE — Assessment & Plan Note (Signed)
On metformin for same because of chronic pred for RA Also uses bursts for pulm flares Does not monitor home cbg Check a1c and titrate as needed Lab Results  Component Value Date   HGBA1C 6.2 02/07/2014

## 2014-08-20 NOTE — Assessment & Plan Note (Signed)
Patient reports increasing nocturnal symptoms of needing to move to alleviate restless symptom left greater than right side Previously declined medication treatment for same, but I advised follow with pulmonary if increasing symptoms to reconsider need for treatment

## 2014-08-20 NOTE — Assessment & Plan Note (Signed)
check TSH today -will titrate as needed Lab Results  Component Value Date   TSH 2.64 02/07/2014

## 2014-08-20 NOTE — Progress Notes (Signed)
Subjective:    Patient ID: Tracey Duncan, female    DOB: [birth year], 68 y.o.   MRN: [id number]  HPI  Patient is here for follow up  Reviewed chronic medical issues and interval medical events  Past Medical History  Diagnosis Date  . Hypertension   . Atrial fibrillation     failed cardioversion, on anticoag  . OSA on CPAP   . Asthma   . Hyperlipidemia   . COPD (chronic obstructive pulmonary disease)     with pulm HTN - on 6-8LPM O2  . Chronic diastolic CHF (congestive heart failure)   . Cor pulmonale   . Obesity hypoventilation syndrome   . Diabetes type 2, controlled   . Pulmonary arterial hypertension   . Secondary PAH   . Unspecified hypothyroidism     Review of Systems  Constitutional: Negative for fatigue and unexpected weight change.  Respiratory: Positive for shortness of breath.   Cardiovascular: Negative for palpitations and leg swelling.       Objective:   Physical Exam  BP 120/60 mmHg  Pulse 93  Temp(Src) 97.5 F (36.4 C) (Oral)  Ht _0  (1.626 m)  Wt 201 lb (91.173 kg)  BMI 34.48 kg/m2  SpO2 99% Wt Readings from Last 3 Encounters:  08/20/14 201 lb (91.173 kg)  08/08/14 204 lb (92.534 kg)  07/17/14 200 lb (90.719 kg)   Constitutional: She is MO, wearing O2 - sister at side. Pt appears well-developed, well-nourished and no distress.  HENT: Indurated and inflamed soft tissue swelling left inner mouth bucal mucosa at bite line, no ulceration or purulence Neck: thick. Normal range of motion. Neck supple. No JVD present. No thyromegaly present.  Cardiovascular: Normal rate, regular rhythm and normal heart sounds.  No murmur heard. fatty ankles with trace BLE edema. Pulmonary/Chest: Effort normal and breath sounds diminished. No respiratory distress. She has no wheezes.  Psychiatric: She has a normal mood and affect. Her behavior is normal. Judgment and thought content normal.   Lab Results  Component Value Date   WBC 11.4* 07/17/2014   HGB 10.8*  07/17/2014   HCT 34.9* 07/17/2014   PLT 219 07/17/2014   GLUCOSE 167* 07/17/2014   CHOL 150 10/25/2013   TRIG 118.0 10/25/2013   HDL 52.80 10/25/2013   LDLCALC 74 10/25/2013   ALT 11 10/25/2013   AST 17 10/25/2013   NA 142 07/17/2014   K 4.6 07/17/2014   CL 100 07/17/2014   CREATININE 1.57* 07/17/2014   BUN 32* 07/17/2014   CO2 27 07/17/2014   TSH 2.64 02/07/2014   INR 1.19 02/06/2014   HGBA1C 6.2 02/07/2014    No results found.     Assessment & Plan:   Problem List Items Addressed This Visit    Hypothyroidism    check TSH today -will titrate as needed Lab Results  Component Value Date   TSH 2.64 02/07/2014      Relevant Orders      TSH   Prediabetes - Primary    On metformin for same because of chronic pred for RA Also uses bursts for pulm flares Does not monitor home cbg Check a1c and titrate as needed Lab Results  Component Value Date   HGBA1C 6.2 02/07/2014      Relevant Orders      Hemoglobin A1c   RLS (restless legs syndrome)    Patient reports increasing nocturnal symptoms of needing to move to alleviate restless symptom left greater than right side Previously declined medication  treatment for same, but I advised follow with pulmonary if increasing symptoms to reconsider need for treatment

## 2014-08-20 NOTE — Progress Notes (Signed)
Pre visit review using our clinic review tool, if applicable. No additional management support is needed unless otherwise documented below in the visit note.

## 2014-08-20 NOTE — Patient Instructions (Signed)
It was good to see you today.  We have reviewed your prior records including labs and tests today  Tdap (tetanus diphtheria and pertussis) and Prevnar (pneumonia vaccine #2) administered today  Test(s) ordered today. Your results will be released to [place] (or called to you) after review, usually within 72hours after test completion. If any changes need to be made, you will be notified at that same time.  Medications reviewed and updated, no changes recommended at this time.  Discussed with Dr. Kyung Rudd restless leg symptoms and need for treatment if progressive  Please schedule followup in 6 months, call sooner if problems.

## 2014-08-30 ENCOUNTER — Other Ambulatory Visit: Payer: Self-pay | Admitting: Cardiology

## 2014-09-23 ENCOUNTER — Telehealth: Payer: Self-pay | Admitting: Internal Medicine

## 2014-09-23 ENCOUNTER — Ambulatory Visit (INDEPENDENT_AMBULATORY_CARE_PROVIDER_SITE_OTHER)
Admission: RE | Admit: 2014-09-23 | Discharge: 2014-09-23 | Disposition: A | Discharge status: Home / Self Care | Payer: Medicare Other | Source: Ambulatory Visit | Attending: Internal Medicine | Admitting: Internal Medicine

## 2014-09-23 ENCOUNTER — Ambulatory Visit (INDEPENDENT_AMBULATORY_CARE_PROVIDER_SITE_OTHER): Payer: Medicare Other | Admitting: Internal Medicine

## 2014-09-23 ENCOUNTER — Encounter: Payer: Self-pay | Admitting: Internal Medicine

## 2014-09-23 ENCOUNTER — Telehealth: Payer: Self-pay | Admitting: Emergency Medicine

## 2014-09-23 VITALS — BP 132/76 | HR 103 | Ht 64.0 in | Wt 187.0 lb

## 2014-09-23 DIAGNOSIS — J449 Chronic obstructive pulmonary disease, unspecified: Secondary | ICD-10-CM

## 2014-09-23 DIAGNOSIS — I2781 Cor pulmonale (chronic): Secondary | ICD-10-CM

## 2014-09-23 DIAGNOSIS — R0902 Hypoxemia: Secondary | ICD-10-CM

## 2014-09-23 DIAGNOSIS — R059 Cough, unspecified: Secondary | ICD-10-CM

## 2014-09-23 DIAGNOSIS — R05 Cough: Secondary | ICD-10-CM

## 2014-09-23 MED ORDER — DOXYCYCLINE HYCLATE 100 MG PO TABS: 100.0000 mg | ORAL_TABLET | Freq: Two times a day (BID) | ORAL | Status: DC

## 2014-09-23 NOTE — Telephone Encounter (Signed)
Pt is aware to be here to see MW today at 3:30pm. This way we can treat accordingly. Nothing more needed at this time.

## 2014-09-23 NOTE — Progress Notes (Signed)
Subjective:  Patient ID: Tracey Duncan, female    DOB: 02-28-1946, 69 y.o.   MRN: [id number] HPI 69 yo woman, former tobacco (45 pk-yrs), hx of OSA dx in 4/13 (on CPAP + O2), HTN, A fib (failed cardioversion), allergies. COPD dx in 2002, currently on Advair + Spiriva. Albuterol prn, uses it only . Since moving to [place]  she has had more SOB. She is having occasional wheeze, no cough. No CP.  Significant hypoxemia on arrival. Needs a local PCP and cardiologist  ROV 68/25/14 -- COPD, OSA, allergies, A Fib, HTN. She was seen by Dr Shirlee Latch and started anticoagulation for her A fib. Also had TTE that showed PAH with PASP ~ . She is wearing her O2 reliably - has a documented desat on O2 at Dr Alford Highland office. She is ordered for 6L and 8L with exertion.  Currently on advair, spiriva. Singulair + zyrtec.   ROV 12/05/12 -- COPD, OSA, allergies, A Fib, HTN. Also secondary PAH and severe hypoxemia.  PFT today with moderately severe AFL, no BD response, decreased DLCO. A V/Q on 11/21/12 was normal. Her lasix has been adjusted by Dr Shirlee Latch. She is using her oximizer reliably. She is trying to wear CPAP reliably, but still poor sleep - difficulty falling asleep and also staying asleep. Supposed to have ONO on CPAP 4/3.  ROV 01/16/13 --  COPD (FEV1 1.38L), OSA on CPAP, allergies, A Fib, HTN. Also secondary PAH and severe hypoxemia.  Returns for f/u. Reports that her wt has dropped another 8 lbs since our last visit,  breathing well. ONO on CPAP + 3L/min performed > shows 3 discrete episodes of desat that could represent REM-related desats (the report says 2, but it was really 3L/min). Unfortunately about a week ago she developed R UE, hand pain, L knee pain. No f/c, no medication changes or other infectious sx. She follows w [facility].   PULMONARY FUNCTON TEST 12/05/2012  FVC 2.15  FEV1 1.38  FEV1/FVC 64.2  FVC  % Predicted 73  FEV % Predicted 65  FeF 25-75 .65  FeF 25-75 % Predicted 2.44      05/29/13 -- COPD, OSA,  newly dx RA. Secondary PAH and hypoxemia. She was started on prednisone, now weaning and being converted to [place]. She feels MUCH better, is more active. She is compliant with her oximizer. She wears her CPAP. Compliance data shows that she wears it, she states that it helps her clinically.  She is having more nasal drainage, cough with clear sputum.   09/26/13 -- COPD, OSA, newly dx RA. Secondary PAH and hypoxemia. She tells me that her breathing has been worse over the last several months. R heart cath (11/14): mean RA 8, PA 68/28 mean 45, mean PCWP 11, CI 2.29, PVR 7.2 WU. She was started on Tyvaso by Dr Shirlee Latch, has titrated up to 9 puffs qid. She unfortunately feels worse not better, is now limited as to her activity, cannot walk any significant distance (a clear change). She also has now weaned off of prednisone and is on [place], which could be an influence here. She is on Advair and Spiriva.   ROV 11/13/13 -- COPD, OSA, RA, A Fib, secondary PAH and hypoxemia. She has been on Tyvaso since 12/17, 9 puffs since January. She is also on Arava and Pred 5. She feels that the last several months have been characterized by worsening dyspnea, some worsening edema > her lasix has been increased by Dr Shirlee Latch with some improvement in  edema. Her rheumatolgist believes that she could be off pred from their standpoint. She remains on Spiriva + Advair. She remains on CPAP - but hasn't used it last several weeks due to cough. She has not had repeat R heart cath or TTE on Tyvaso yet. She is getting purulent mucous from her nose, resulting in cough. She is on zyrtec and sigulair.    ROV 04/30/14 -- COPD, OSA, RA, A Fib, secondary PAH and hypoxemia; she is managed with arava + pred. She is now off Tyvaso and she feels better off of it. She has some cough, nagging an prod of white. She has not been wearing her CPAP reliably - has lost 100 lbs since her PSG.    R heart cath 02/06/14 -- Final Conclusions: RA pressure and PCWP look  good, still with moderate pulmonary hypertension. This study really shows no significant change compared to the pre-Tyvaso RHC. I suspect that patient may have PAH primarily due to COPD and OHS/OSA rather than group I PAH.  Recommendations: Will have her hold Tyvaso and see if this makes her feel any worse. If not, will probably stop it.   ROV 05/29/14 -- hx COPD, OSA, RA, A fib, hypoxemia and secondary PAH. Last time we ordered split night PSG, scheduled for 06/23/14. She is on high-flow O2. Feels well - has been active.  She asks today whether she can / should get Tdap immunization, prevnar-13  ROV 08/08/14 -- hx COPD, OSA, RA, A fib, hypoxemia and secondary PAH on opsumit. Her PSG showed hypoxemia without obstructive events, evidence for limb movement disorder.  For the last 5 days she has had increasing congestion, mucous from head and chest. No clear fever.  We discussed her limb movement - she wants to hold off on meds for this now. rec Please take doxycycline 100mg  twice a day for 7 days Stay on all of your other medications as you have been taking them We can revisit your improvement on the opsumit at our next visit We will defer starting a medication for restless leg syndrome at this time.   09/23/2014 acute  ov/Tracey Duncan re:  Cough/sob   Chief Complaint  Patient presents with  . Acute Visit    RB pt here for increased sob with exertion, prod cough with pink/white/yellow mucus, sinus congestion, dry nose X1 week.   baseline 6 lpm 24/7 except walking outside 8lpm and advair 250 one bid and singulair and zyrtec and prednisone 5 mg day  Cough on and off x sev months and saw Rheumatologist Dr Shawnee Knapp PA rx steroid shot ? Some better transiently  No orthopnea/ pnd/   and wt is dropping despite leg swelling still present on lasix daily  New problem with diarrhea x sev years   No obvious patterns in day to day or daytime variabilty or assoc   cp or chest tightness, subjective wheeze overt sinus or  hb symptoms. No unusual exp hx or h/o childhood pna/ asthma or knowledge of premature birth.  Sleeping ok without nocturnal  or early am exacerbation  of respiratory  c/o's or need for noct saba. Also denies any obvious fluctuation of symptoms with weather or environmental changes or other aggravating or alleviating factors except as outlined above   Current Medications, Allergies, Complete Past Medical History, Past Surgical History, Family History, and Social History were reviewed in [employer] record.  ROS  The following are not active complaints unless bolded sore throat, dysphagia, dental problems, itching, sneezing,  nasal congestion or excess/ purulent secretions, ear ache,   fever, chills, sweats, unintended wt loss, pleuritic or exertional cp, hemoptysis,  orthopnea pnd or leg swelling, presyncope, palpitations, heartburn, abdominal pain, anorexia, nausea, vomiting, diarrhea  or change in bowel or urinary habits, change in stools or urine, dysuria,hematuria,  rash, arthralgias, visual complaints, headache, numbness weakness or ataxia or problems with walking or coordination,  change in mood/affect or memory.              Objective:   Physical Exam Gen: Pleasant, obese, in no distress,  normal affect   . Wt Readings from Last 3 Encounters:  09/23/14 187 lb (84.823 kg)  08/20/14 201 lb (91.173 kg)  08/08/14 204 lb (92.534 kg)    Vital signs reviewed   ENT: No lesions,  mouth clear,  oropharynx clear, no postnasal drip  Neck: No JVD, no TMG, no carotid bruits  Lungs: No use of accessory muscles, min insp and exp rhonchi  Cardiovascular: RRR, heart sounds normal, no murmur or gallops, 1+ peripheral edema  Musculoskeletal: No deformities, no cyanosis or clubbing  Neuro: alert, non focal  Skin: Warm, no lesions or rashes   R heart cath 07/11/13  -- RA mean 8  RV 70/7  PA 68/28, mean 45  PCWP mean 11  Oxygen saturations (on 5L home oxygen):   PA 64%  AO 99%  Cardiac Output (Fick) 4.74  Cardiac Index (Fick) 2.29  PVR 7.2 WU    CXR PA and Lateral:   09/23/2014 :     I personally reviewed images and agree with radiology impression as follows:    Similar appearance to prior study with the exception of increased right pleural effusion. Congestive heart failure with mild interstitial pulmonary edema is suspected. Right lower lobe opacity indicates pleural effusion with underlying consolidation, likely due to atelectasis although in the appropriate setting the possibility of right lower lobe pneumonia could be considered.     Assessment & Plan:

## 2014-09-23 NOTE — Telephone Encounter (Signed)
Requesting a referral for transportation for to and from MD appt's, is that possible?

## 2014-09-23 NOTE — Patient Instructions (Addendum)
For breathing, ok to use nebulizer up to 4 hours if needed   For cough > mucinex up to 1200 mg every 12 hours   Doxycycline 100 mg one twice daily with glass of water before you eat   Prednisone double until better, then reduce back to 5 mg daily   Please remember to go to the  x-ray department downstairs for your tests - we will call you with the results when they are available. Late add :  slt increase chronic effusions already on lasix, may need adjustment or add aldactone  So f/u at 2 weeks

## 2014-09-24 ENCOUNTER — Encounter: Payer: Self-pay | Admitting: Internal Medicine

## 2014-09-24 NOTE — Progress Notes (Signed)
Quick Note:  Spoke with pt and notified of results per Dr. Melvyn Novas. Pt verbalized understanding and denied any questions. OV with TP for 10/08/14 at 11 am ______

## 2014-09-24 NOTE — Telephone Encounter (Signed)
Pt contacted regarding transportation request and gave PCP indication below.  Pt stated understanding and will contact [facility] for more information.

## 2014-09-24 NOTE — Telephone Encounter (Signed)
Is pt referring to [facility]? Would need to contact the [facility] for application and I can then sign same I am unaware of other transportation referrals

## 2014-09-26 NOTE — Assessment & Plan Note (Signed)
See pfts 2014 c/w GOLD II   No change chronic rx needed

## 2014-09-26 NOTE — Assessment & Plan Note (Signed)
Chronic problem but acutely worse with URI and doesn't really understand how/ when to use her prns  rec doxy x 10 days, double prednisone until better then back to baseline    I had an extended discussion with the patient reviewing all relevant studies completed to date and  lasting 15 to 20 minutes of a 25 minute visit on the following ongoing concerns:   Each maintenance medication was reviewed in detail including most importantly the difference between maintenance and as needed and under what circumstances the prns are to be used.  Please see instructions for details which were reviewed in writing and the patient given a copy.

## 2014-09-26 NOTE — Assessment & Plan Note (Signed)
Adequate control on present rx, reviewed > no change in rx needed   

## 2014-09-26 NOTE — Assessment & Plan Note (Addendum)
Last echo 10/22/12 reviewed    Has persistent  peripheral edema and also slt worse pleural effusions > consider adding aldactone next ov

## 2014-09-30 ENCOUNTER — Other Ambulatory Visit: Payer: Self-pay | Admitting: Emergency Medicine

## 2014-10-08 ENCOUNTER — Ambulatory Visit: Payer: Medicare Other | Admitting: Adult Health

## 2014-10-09 ENCOUNTER — Telehealth (HOSPITAL_COMMUNITY): Payer: Self-pay | Admitting: Vascular Surgery

## 2014-10-09 MED ORDER — POTASSIUM CHLORIDE CRYS ER 20 MEQ PO TBCR: EXTENDED_RELEASE_TABLET | ORAL | Status: DC

## 2014-10-09 NOTE — Telephone Encounter (Signed)
Pt needs a new prescription for Potassium w/ dosage changes 3 in the morning 2 in the evening, she keeps running out with old prescription

## 2014-10-16 ENCOUNTER — Other Ambulatory Visit: Payer: Self-pay

## 2014-10-16 DIAGNOSIS — I5032 Chronic diastolic (congestive) heart failure: Secondary | ICD-10-CM

## 2014-10-16 MED ORDER — MACITENTAN 10 MG PO TABS: 10.0000 mg | ORAL_TABLET | Freq: Every day | ORAL | Status: DC

## 2014-10-22 ENCOUNTER — Encounter (HOSPITAL_COMMUNITY): Payer: Medicare Other

## 2014-10-31 ENCOUNTER — Other Ambulatory Visit: Payer: Self-pay | Admitting: Cardiology

## 2014-10-31 ENCOUNTER — Other Ambulatory Visit (HOSPITAL_COMMUNITY): Payer: Self-pay | Admitting: *Deleted

## 2014-10-31 DIAGNOSIS — I5022 Chronic systolic (congestive) heart failure: Secondary | ICD-10-CM

## 2014-10-31 MED ORDER — FUROSEMIDE 40 MG PO TABS: ORAL_TABLET | ORAL | Status: DC

## 2014-11-06 ENCOUNTER — Ambulatory Visit (HOSPITAL_COMMUNITY)
Admission: RE | Admit: 2014-11-06 | Discharge: 2014-11-06 | Disposition: A | Discharge status: Home / Self Care | Payer: Medicare Other | Source: Ambulatory Visit | Attending: Cardiology | Admitting: Cardiology

## 2014-11-06 ENCOUNTER — Other Ambulatory Visit: Payer: Self-pay

## 2014-11-06 VITALS — BP 112/66 | HR 97 | Wt 192.8 lb

## 2014-11-06 DIAGNOSIS — E662 Morbid (severe) obesity with alveolar hypoventilation: Secondary | ICD-10-CM

## 2014-11-06 DIAGNOSIS — I5032 Chronic diastolic (congestive) heart failure: Secondary | ICD-10-CM

## 2014-11-06 DIAGNOSIS — I270 Primary pulmonary hypertension: Secondary | ICD-10-CM | POA: Diagnosis not present

## 2014-11-06 DIAGNOSIS — I272 Pulmonary hypertension, unspecified: Secondary | ICD-10-CM

## 2014-11-06 DIAGNOSIS — I5022 Chronic systolic (congestive) heart failure: Secondary | ICD-10-CM

## 2014-11-06 LAB — CBC
HCT: 37.3 % (ref 36.0–46.0)
Hemoglobin: 11.3 g/dL — ABNORMAL LOW (ref 12.0–15.0)
MCH: 27.8 pg (ref 26.0–34.0)
MCHC: 30.3 g/dL (ref 30.0–36.0)
MCV: 91.6 fL (ref 78.0–100.0)
PLATELETS: 184 10*3/uL (ref 150–400)
RBC: 4.07 MIL/uL (ref 3.87–5.11)
RDW: 17.9 % — ABNORMAL HIGH (ref 11.5–15.5)
WBC: 10.9 10*3/uL — AB (ref 4.0–10.5)

## 2014-11-06 LAB — BASIC METABOLIC PANEL
ANION GAP: 9 (ref 5–15)
BUN: 28 mg/dL — ABNORMAL HIGH (ref 6–23)
CALCIUM: 9.1 mg/dL (ref 8.4–10.5)
CO2: 30 mmol/L (ref 19–32)
CREATININE: 1.62 mg/dL — AB (ref 0.50–1.10)
Chloride: 103 mmol/L (ref 96–112)
GFR calc Af Amer: 37 mL/min — ABNORMAL LOW (ref >90)
GFR, EST NON AFRICAN AMERICAN: 32 mL/min — AB (ref >90)
Glucose, Bld: 122 mg/dL — ABNORMAL HIGH (ref 70–99)
Potassium: 4.5 mmol/L (ref 3.5–5.1)
Sodium: 142 mmol/L (ref 135–145)

## 2014-11-06 LAB — BRAIN NATRIURETIC PEPTIDE: B Natriuretic Peptide: 293.7 pg/mL — ABNORMAL HIGH (ref 0.0–100.0)

## 2014-11-06 MED ORDER — POTASSIUM CHLORIDE CRYS ER 20 MEQ PO TBCR: 60.0000 meq | EXTENDED_RELEASE_TABLET | Freq: Two times a day (BID) | ORAL | Status: DC

## 2014-11-06 MED ORDER — MACITENTAN 10 MG PO TABS: 10.0000 mg | ORAL_TABLET | Freq: Every day | ORAL | Status: DC

## 2014-11-06 MED ORDER — FUROSEMIDE 80 MG PO TABS: 80.0000 mg | ORAL_TABLET | Freq: Two times a day (BID) | ORAL | Status: DC

## 2014-11-06 NOTE — Progress Notes (Signed)
6 Minute Walk  Patient ambulated total of 520 feet with steady gait and pace.  No rest breaks needed, O2 sat on 8L continuous O2 via [place] maintained 88-91%.  Starting Hr 86 Ending Hr 140  Patient tolerated walk very well, no SOB noted. Dr. [name] made aware of results.  Renee Pain

## 2014-11-06 NOTE — Progress Notes (Signed)
Patient ID: ELANY FELIX, female   DOB: 01-Nov-1945, 69 y.o.   MRN: [id number] PCP: Asa Lente  69 yo with history of COPD on home oxygen, OHS/OSA, rheumatoid arthritis, and chronic atrial fibrillation presents for cardiology followup.  She was diagnosed with atrial fibrillation around 2004. She failed cardioversion and it has been chronic.  She has noted exertional dyspnea since at least 2012.  She was diagnosed with COPD back in 2002.  She no longer smokes.  She had been on CPAP for OSA prior, but was started on home oxygen all the time by Dr. Lamonte Sakai.  No tachypalpitations, no chest pain.  Echo in 2/14 showed a moderately dilated RV with moderately decreased systolic function, PA systolic pressure 65 mmHg. She was started on apixaban.   V/Q scan was negative for chronic PE.  She has been diagnosed with rheumatoid arthritis and is on [name].  I did a right heart cath in 11/14 that confirmed moderate to severe PAH with PVR 7.4 WU.  In 12/14, I started her on Tyvaso. I did a repeat RHC in 6/15.  This was nearly identical to the prior study in 11/14. Given apparent minimal response to Tyvaso, I had her try coming off the medication.  Off Tyvaso, she felt that her breathing was definitely better.    Today, patient is stable.  Her breathing is about the same.  She can do housework, shower, and walk around her house without dyspnea.  If she walks for longer distanes, she is short of breath.  She is now on Opsumit and thinks that it may have helped a bit.  No chest pain, no lightheadedness, no syncope.  No orthopnea.  CXR in 1/16 showed a right moderate pleural effusion. Weight is down 8 lbs.   6 minute walk 11/15 122 m 6 minute walk 3/16 158 m  Labs (4/14): RF < 10, ANA negative, BNP 294, K 3.8, creatinine 1.7  Labs (9/14): K 4.5, creatinine 1.74 Labs (11/14): K 4.5=>3.9, creatinine 1.6, BNP 300 Labs (2/15): K 4.3, creatinine 1.6=>1.5, BNP 383=>311, LDL 72, HDL 49 Labs (3/15): K 4.2, creatinine 1.5 Labs (5/15):  K 4.6, creatinine 1.5 Labs (6/15): HCT 32.1, TSH normal, HCT 32.1 Labs (11/15): K 4.6, creatinine 1.57 Labs (12/15): TSH normal  PMH: 1. OSA/OHS: On CPAP at night and oxygen during the day.  However, sleep study (11/15) did not show significant OSA.  2. COPD: Diagnosed 2002.  FEV1 65% predicted in 4/14.  3. HTN 4. Chronic atrial fibrillation: Failed DCCV in 12/04.   5. Pulmonary HTN/cor pulmonale: Suspect this partly due to COPD and OHS/OSA (group 3) but cannot rule out group 1 component (has RA).  Echo (2/14) with EF 55-60%, moderately dilated RV with moderately decreased systolic function, PA systolic pressure 65 mmHg.  V/Q scan in 3/14 was negative for chronic PE. PFTs (4/14): FEV1 65% predicted, mild obstructive defect with severely decreased DLCO.  RHC (11/14): mean RA 8, PA 68/28 mean 45, mean PCWP 11, CI 2.29, PVR 7.2 WU. 6 minute walk (2/15) 126 meters.  6 minute walk (3/15) 118 m.  She started Tyvaso.  Repeat RHC in 6/15 on Tyvaso with mean RA 7, PA 65/28 mean 43, mean PCWP 16, PVR 6.2 WU, CI 2.23.  Given minimal change, Tyvaso was stopped and patient actually felt better.  6 minute walk (11/15) 122 m.  6. Lexiscan Thallium in 11/11 with no evidence for ischemia or infarction.  7. Impaired fasting glucose. 8. Hypothyroidism 9. Obesity. 10. CKD  11. Rheumatoid arthritis 12. ABIs (11/14): Normal 13. Hyperlipidemia: Myalgias with Crestor.  14. Depression 15. Chronic diastolic CHF 16. Gout  SH: Widow, moved to [place] from [place] to live with sister.  Quit smoking in 1993.  Rare ETOH. Retired Optometrist.   FH: Brother with MI at 52, father with MI at 25.  ROS: All systems reviewed and negative except as per HPI.   Current Outpatient Prescriptions  Medication Sig Dispense Refill  . acetaminophen (TYLENOL) 500 MG tablet Take 1,000 mg by mouth daily as needed (for arthritic pain).    . ADVAIR DISKUS 250-50 MCG/DOSE AEPB INHALE 1 PUFF INTO THE LUNGS EVERY 12 (TWELVE) HOURS. 60 each 5   . albuterol (PROAIR HFA) 108 (90 BASE) MCG/ACT inhaler Inhale 2 puffs into the lungs every 4 (four) hours as needed for wheezing or shortness of breath.     . Benzocaine (ANBESOL MT) Use as directed 1 application in the mouth or throat 2 (two) times daily as needed (for dry mouth).    . bisoprolol (ZEBETA) 10 MG tablet TAKE 1 TABLET EVERY DAY 30 tablet 6  . calcium carbonate (TUMS - DOSED IN MG ELEMENTAL CALCIUM) 500 MG chewable tablet Chew 2 tablets by mouth daily as needed for indigestion or heartburn.     . calcium-vitamin D (OSCAL WITH D) 500-200 MG-UNIT per tablet Take 1 tablet by mouth daily.    . cetirizine (ZYRTEC) 10 MG tablet Take 10 mg by mouth at bedtime.     . cholecalciferol (VITAMIN D) 1000 UNITS tablet Take 1,000 Units by mouth daily.    . Colchicine 0.6 MG CAPS Take 1 capsule by mouth every other day.     Marland Kitchen ELIQUIS 5 MG TABS tablet TAKE 1 TABLET BY MOUTH TWICE A DAY 60 tablet 0  . febuxostat (ULORIC) 40 MG tablet Take 80 mg by mouth daily.     . furosemide (LASIX) 80 MG tablet Take 1 tablet (80 mg total) by mouth 2 (two) times daily. 60 tablet 4  . ipratropium-albuterol (DUONEB) 0.5-2.5 (3) MG/3ML SOLN Take 3 mLs by nebulization every 4 (four) hours as needed.     . leflunomide (ARAVA) 20 MG tablet Take 20 mg by mouth daily.    Marland Kitchen levothyroxine (SYNTHROID, LEVOTHROID) 50 MCG tablet Take 1 tablet (50 mcg total) by mouth daily before breakfast. 90 tablet 3  . Magnesium 400 MG CAPS Take 1 capsule by mouth daily.    . Melatonin 5 MG CAPS Take 1 capsule by mouth at bedtime as needed.     . metFORMIN (GLUCOPHAGE) 500 MG tablet Take 1 tablet (500 mg total) by mouth 2 (two) times daily with a meal. 180 tablet 3  . montelukast (SINGULAIR) 10 MG tablet Take 10 mg by mouth at bedtime.    . Multiple Vitamins-Minerals (CENTRUM SILVER PO) Take 1 tablet by mouth daily.    . Polyvinyl Alcohol-Povidone (REFRESH OP) Place 1 drop into both eyes daily as needed (for dry eyes).    . potassium  chloride SA (KLOR-CON M20) 20 MEQ tablet Take 3 tablets (60 mEq total) by mouth 2 (two) times daily. 180 tablet 4  . pravastatin (PRAVACHOL) 20 MG tablet TAKE 1 TABLET BY MOUTH EVERY EVENING 30 tablet 6  . prednisoLONE 5 MG TABS tablet Take 5 mg by mouth daily.    Marland Kitchen PRESCRIPTION MEDICATION Oxygen.   Pt uses 6-8 liters of oxygen daily.    . sertraline (ZOLOFT) 50 MG tablet TAKE 1 TABLET BY MOUTH EVERY  DAY 30 tablet 8  . SPIRIVA HANDIHALER 18 MCG inhalation capsule INHALE 1 CAPSULE VIA HANDIHALER ONCE DAILY AT THE SAME TIME EVERY DAY 1 capsule 3  . traMADol (ULTRAM) 50 MG tablet Take 100 mg by mouth as needed for moderate pain.     Marland Kitchen zolpidem (AMBIEN) 5 MG tablet Take 5 mg by mouth daily as needed for sleep.    . Macitentan (OPSUMIT) 10 MG TABS Take 10 mg by mouth daily. 30 tablet 6   No current facility-administered medications for this encounter.    BP 112/66 mmHg  Pulse 97  Wt 192 lb 12.8 oz (87.454 kg)  SpO2 88% General: NAD, obese.  Neck: JVP 10-11 cm, no thyromegaly or thyroid nodule.  Lungs: Distant breath sounds bilaterally.  CV: Nondisplaced PMI.  Heart irregular S1/S2, no S3/S4, no murmur.  1+ edema 1/2 to knees bilaterally.  No carotid bruit.   Abdomen: Soft, nontender, no hepatosplenomegaly, no distention.  Skin: Intact without lesions or rashes.  Neurologic: Alert and oriented x 3.  Psych: Normal affect. Extremities: No clubbing or cyanosis.    Assessment/Plan: 1. Atrial fibrillation: Chronic x years.  She would be unlikely to remain in NSR if cardioversion were attempted.  Plan rate control.  CHADSVASC = 3 at least.  No history of GI bleeding.  Continue apixaban.  Reasonable rate control with bisoprolol.  Check BMET/CBC today.  2. Pulmonary arterial HTN/cor pulmonale: Significant right heart failure.  Last echo with moderately dilated and dysfunctional RV and PA systolic pressure 65 mmHg, PA systolic pressure 20/23 on RHC in 6/15 with normal PCWP.  She had minimal improvement  with Tyvaso (nearly identical RHC on and off Tyvaso) and actually felt considerably better off Tyvaso.  Patient has a group 3 component to her PAH (OHS/OSA and COPD), but cannot rule out a group 1 contribution that could be related to rheumatological disease (rheumatoid arthritis).  6 minute walk today was mildly improved compared to prior walk.  She is tolerating Opsumit.   - Continue home oxygen.  - Continue Opsumit.  - BNP today.  - Echo to reassess RV size/function.  3. CKD: BMET today.     4. RA: Being treated (Dr. Amil Amen).  Continues to be limited by joint pain. 5. CHF: Primarily RV failure.  PCWP was normal on last RHC.  On exam today, she appears more volume overloaded.   - Increase Lasix to 80 mg bid.  BMET in 2 wks.  - Increase KCl to 60 bid.  - Will get CXR to followup on moderate right pleural effusion. 6. Depression: Improved mood with sertraline.   Followup in 1 month.    [name] 11/06/2014

## 2014-11-06 NOTE — Patient Instructions (Addendum)
INCREASE Lasix to 80 mg twice a day  INCREASE Potassium to 60 meq (3 tablets) twice daily.  Labs today and again in 2 weeks (BMET)  Your physician recommends that you schedule a follow-up appointment in: 1 month with echocardiogram

## 2014-11-10 ENCOUNTER — Telehealth (HOSPITAL_COMMUNITY): Payer: Self-pay | Admitting: Vascular Surgery

## 2014-11-10 NOTE — Telephone Encounter (Signed)
Pt called for lab results

## 2014-11-10 NOTE — Telephone Encounter (Signed)
Left mess labs stable and x-ray improved c/b for questions

## 2014-11-10 NOTE — Telephone Encounter (Signed)
Left message to call back

## 2014-11-18 ENCOUNTER — Ambulatory Visit: Payer: Medicare Other | Admitting: Emergency Medicine

## 2014-11-19 ENCOUNTER — Ambulatory Visit (HOSPITAL_COMMUNITY)
Admission: RE | Admit: 2014-11-19 | Discharge: 2014-11-19 | Disposition: A | Discharge status: Home / Self Care | Payer: Medicare Other | Source: Ambulatory Visit | Attending: Internal Medicine | Admitting: Internal Medicine

## 2014-11-19 DIAGNOSIS — I272 Other secondary pulmonary hypertension: Secondary | ICD-10-CM | POA: Diagnosis not present

## 2014-11-19 DIAGNOSIS — I5022 Chronic systolic (congestive) heart failure: Secondary | ICD-10-CM

## 2014-11-19 LAB — BASIC METABOLIC PANEL
Anion gap: 10 (ref 5–15)
BUN: 28 mg/dL — ABNORMAL HIGH (ref 6–23)
CALCIUM: 8.8 mg/dL (ref 8.4–10.5)
CHLORIDE: 103 mmol/L (ref 96–112)
CO2: 29 mmol/L (ref 19–32)
Creatinine, Ser: 1.67 mg/dL — ABNORMAL HIGH (ref 0.50–1.10)
GFR calc Af Amer: 36 mL/min — ABNORMAL LOW (ref >90)
GFR calc non Af Amer: 31 mL/min — ABNORMAL LOW (ref >90)
GLUCOSE: 130 mg/dL — AB (ref 70–99)
Potassium: 4.4 mmol/L (ref 3.5–5.1)
SODIUM: 142 mmol/L (ref 135–145)

## 2014-11-26 ENCOUNTER — Other Ambulatory Visit: Payer: Self-pay | Admitting: Emergency Medicine

## 2014-12-05 ENCOUNTER — Telehealth (HOSPITAL_COMMUNITY): Payer: Self-pay | Admitting: *Deleted

## 2014-12-05 ENCOUNTER — Other Ambulatory Visit (HOSPITAL_COMMUNITY): Payer: Self-pay | Admitting: *Deleted

## 2014-12-05 NOTE — Telephone Encounter (Signed)
Eliquis approved from 12/05/14-12/05/15.  Faxed to pharmacy.

## 2014-12-08 ENCOUNTER — Encounter (HOSPITAL_COMMUNITY): Payer: Medicare Other

## 2014-12-08 ENCOUNTER — Other Ambulatory Visit (HOSPITAL_COMMUNITY): Payer: Medicare Other

## 2014-12-11 ENCOUNTER — Ambulatory Visit (HOSPITAL_COMMUNITY)
Admission: RE | Admit: 2014-12-11 | Discharge: 2014-12-11 | Disposition: A | Discharge status: Home / Self Care | Payer: Medicare Other | Source: Ambulatory Visit | Attending: Cardiology | Admitting: Cardiology

## 2014-12-11 ENCOUNTER — Ambulatory Visit (HOSPITAL_BASED_OUTPATIENT_CLINIC_OR_DEPARTMENT_OTHER)
Admission: RE | Admit: 2014-12-11 | Discharge: 2014-12-11 | Disposition: A | Discharge status: Home / Self Care | Payer: Medicare Other | Source: Ambulatory Visit | Attending: Cardiology | Admitting: Cardiology

## 2014-12-11 VITALS — BP 120/64 | HR 82 | Wt 195.8 lb

## 2014-12-11 DIAGNOSIS — I270 Primary pulmonary hypertension: Secondary | ICD-10-CM | POA: Diagnosis not present

## 2014-12-11 DIAGNOSIS — I482 Chronic atrial fibrillation, unspecified: Secondary | ICD-10-CM

## 2014-12-11 DIAGNOSIS — I5032 Chronic diastolic (congestive) heart failure: Secondary | ICD-10-CM | POA: Diagnosis not present

## 2014-12-11 DIAGNOSIS — I2781 Cor pulmonale (chronic): Secondary | ICD-10-CM

## 2014-12-11 DIAGNOSIS — I272 Pulmonary hypertension, unspecified: Secondary | ICD-10-CM

## 2014-12-11 DIAGNOSIS — J449 Chronic obstructive pulmonary disease, unspecified: Secondary | ICD-10-CM | POA: Diagnosis not present

## 2014-12-11 LAB — BASIC METABOLIC PANEL
ANION GAP: 7 (ref 5–15)
BUN: 27 mg/dL — ABNORMAL HIGH (ref 6–23)
CHLORIDE: 101 mmol/L (ref 96–112)
CO2: 33 mmol/L — ABNORMAL HIGH (ref 19–32)
CREATININE: 1.6 mg/dL — AB (ref 0.50–1.10)
Calcium: 8.7 mg/dL (ref 8.4–10.5)
GFR calc Af Amer: 37 mL/min — ABNORMAL LOW (ref >90)
GFR calc non Af Amer: 32 mL/min — ABNORMAL LOW (ref >90)
Glucose, Bld: 157 mg/dL — ABNORMAL HIGH (ref 70–99)
Potassium: 4.1 mmol/L (ref 3.5–5.1)
SODIUM: 141 mmol/L (ref 135–145)

## 2014-12-11 LAB — BRAIN NATRIURETIC PEPTIDE: B Natriuretic Peptide: 249.3 pg/mL — ABNORMAL HIGH (ref 0.0–100.0)

## 2014-12-11 MED ORDER — TADALAFIL (PAH) 20 MG PO TABS: 20.0000 mg | ORAL_TABLET | Freq: Every day | ORAL | Status: DC

## 2014-12-11 MED ORDER — TORSEMIDE 20 MG PO TABS: 60.0000 mg | ORAL_TABLET | Freq: Two times a day (BID) | ORAL | Status: DC

## 2014-12-11 NOTE — Progress Notes (Signed)
  Echocardiogram 2D Echocardiogram has been performed.  Tracey Duncan FRANCES 12/11/2014, 10:49 AM

## 2014-12-11 NOTE — Patient Instructions (Signed)
Start Adcirca 20 mg daily, we have sent the prescription to [facility], they will contact you before sending med out  Stop Furosemide (Lasix)  Start Torsemide 20 mg tabs, TAKE 4 tabs (80 mg) daily for 3 DAYS ONLY, THEN decrease to 3 tabs (60 mg) DAILY  Labs today  Your physician recommends that you schedule a follow-up appointment in: 2 weeks with labs (bmet, bnp)

## 2014-12-12 ENCOUNTER — Other Ambulatory Visit (HOSPITAL_COMMUNITY): Payer: Self-pay | Admitting: *Deleted

## 2014-12-12 MED ORDER — TORSEMIDE 20 MG PO TABS: ORAL_TABLET | ORAL | Status: DC

## 2014-12-12 NOTE — Progress Notes (Signed)
Patient ID: Tracey Duncan, female   DOB: [birth year], 69 y.o.   MRN: [id number] PCP: Asa Lente  69 yo with history of COPD on home oxygen, OHS/OSA, rheumatoid arthritis, and chronic atrial fibrillation presents for cardiology followup.  She was diagnosed with atrial fibrillation around 2004. She failed cardioversion and it has been chronic.  She has noted exertional dyspnea since at least 2012.  She was diagnosed with COPD back in 2002.  She no longer smokes.  She had been on CPAP for OSA prior, but was started on home oxygen all the time by Dr. Lamonte Sakai.  No tachypalpitations, no chest pain.  Echo in 2/14 showed a moderately dilated RV with moderately decreased systolic function, PA systolic pressure 65 mmHg. She was started on apixaban.   V/Q scan was negative for chronic PE.  She has been diagnosed with rheumatoid arthritis and is on [name].  I did a right heart cath in 11/14 that confirmed moderate to severe PAH with PVR 7.4 WU.  In 12/14, I started her on Tyvaso. I did a repeat RHC in 6/15.  This was nearly identical to the prior study in 11/14. Given apparent minimal response to Tyvaso, I had her try coming off the medication.  Off Tyvaso, she felt that her breathing was definitely better.  She is now Opsumit, which she feels has helped.  Last 6 min walk was improved.   Today, patient is stable.  Her breathing is about the same.  She can do housework, shower, and walk around her house without dyspnea.  If she walks for longer distanes, she is short of breath.  No chest pain, no lightheadedness, no syncope.  No orthopnea.  Weight is up 3 lbs.    6 minute walk 11/15 122 m 6 minute walk 3/16 158 m  Labs (4/14): RF < 10, ANA negative, BNP 294, K 3.8, creatinine 1.7  Labs (9/14): K 4.5, creatinine 1.74 Labs (11/14): K 4.5=>3.9, creatinine 1.6, BNP 300 Labs (2/15): K 4.3, creatinine 1.6=>1.5, BNP 383=>311, LDL 72, HDL 49 Labs (3/15): K 4.2, creatinine 1.5 Labs (5/15): K 4.6, creatinine 1.5 Labs (6/15): HCT  32.1, TSH normal, HCT 32.1 Labs (11/15): K 4.6, creatinine 1.57 Labs (12/15): TSH normal Labs (3/16): K 4.4, creatinine 1.67, BNP 294, HCT 37.3  PMH: 1. OSA/OHS: On CPAP at night and oxygen during the day.  However, sleep study (11/15) did not show significant OSA.  2. COPD: Diagnosed 2002.  FEV1 65% predicted in 4/14.  3. HTN 4. Chronic atrial fibrillation: Failed DCCV in 12/04.   5. Pulmonary HTN/cor pulmonale: Suspect this partly due to COPD and OHS/OSA (group 3) but cannot rule out group 1 component (has RA).  Echo (2/14) with EF 55-60%, moderately dilated RV with moderately decreased systolic function, PA systolic pressure 65 mmHg.  V/Q scan in 3/14 was negative for chronic PE. PFTs (4/14): FEV1 65% predicted, mild obstructive defect with severely decreased DLCO.  RHC (11/14): mean RA 8, PA 68/28 mean 45, mean PCWP 11, CI 2.29, PVR 7.2 WU. 6 minute walk (2/15) 126 meters.  6 minute walk (3/15) 118 m.  She started Tyvaso.  Repeat RHC in 6/15 on Tyvaso with mean RA 7, PA 65/28 mean 43, mean PCWP 16, PVR 6.2 WU, CI 2.23.  Given minimal change, Tyvaso was stopped and patient actually felt better. Echo (4/16) with EF 55-60%, severe biatrial enlargement, moderate RV dilation with mildly decreased systolic function, PA systolic pressure 73 mmHg.  6. Lexiscan Thallium in 11/11 with  no evidence for ischemia or infarction.  7. Impaired fasting glucose. 8. Hypothyroidism 9. Obesity. 10. CKD 11. Rheumatoid arthritis 12. ABIs (11/14): Normal 13. Hyperlipidemia: Myalgias with Crestor.  14. Depression 15. Chronic diastolic CHF 16. Gout  SH: Widow, moved to [place] from [place] to live with sister.  Quit smoking in 1993.  Rare ETOH. Retired Optometrist.   FH: Brother with MI at 43, father with MI at 40.  ROS: All systems reviewed and negative except as per HPI.   Current Outpatient Prescriptions  Medication Sig Dispense Refill  . acetaminophen (TYLENOL) 500 MG tablet Take 1,000 mg by mouth daily as  needed (for arthritic pain).    . ADVAIR DISKUS 250-50 MCG/DOSE AEPB INHALE 1 PUFF INTO THE LUNGS EVERY 12 (TWELVE) HOURS. 60 each 5  . albuterol (PROAIR HFA) 108 (90 BASE) MCG/ACT inhaler Inhale 2 puffs into the lungs every 4 (four) hours as needed for wheezing or shortness of breath.     . Benzocaine (ANBESOL MT) Use as directed 1 application in the mouth or throat 2 (two) times daily as needed (for dry mouth).    . bisoprolol (ZEBETA) 10 MG tablet TAKE 1 TABLET EVERY DAY 30 tablet 6  . calcium carbonate (TUMS - DOSED IN MG ELEMENTAL CALCIUM) 500 MG chewable tablet Chew 2 tablets by mouth daily as needed for indigestion or heartburn.     . calcium-vitamin D (OSCAL WITH D) 500-200 MG-UNIT per tablet Take 1 tablet by mouth daily.    . cetirizine (ZYRTEC) 10 MG tablet Take 10 mg by mouth at bedtime.     . cholecalciferol (VITAMIN D) 1000 UNITS tablet Take 1,000 Units by mouth daily.    . Colchicine 0.6 MG CAPS Take 1 capsule by mouth every other day.     Marland Kitchen ELIQUIS 5 MG TABS tablet TAKE 1 TABLET BY MOUTH TWICE A DAY 60 tablet 0  . febuxostat (ULORIC) 40 MG tablet Take 80 mg by mouth daily.     Marland Kitchen ipratropium-albuterol (DUONEB) 0.5-2.5 (3) MG/3ML SOLN Take 3 mLs by nebulization every 4 (four) hours as needed.     . leflunomide (ARAVA) 20 MG tablet Take 20 mg by mouth daily.    Marland Kitchen levothyroxine (SYNTHROID, LEVOTHROID) 50 MCG tablet Take 1 tablet (50 mcg total) by mouth daily before breakfast. 90 tablet 3  . Macitentan (OPSUMIT) 10 MG TABS Take 10 mg by mouth daily. 30 tablet 6  . Magnesium 400 MG CAPS Take 1 capsule by mouth daily.    . Melatonin 5 MG CAPS Take 1 capsule by mouth at bedtime as needed.     . metFORMIN (GLUCOPHAGE) 500 MG tablet Take 1 tablet (500 mg total) by mouth 2 (two) times daily with a meal. 180 tablet 3  . montelukast (SINGULAIR) 10 MG tablet Take 10 mg by mouth at bedtime.    . Multiple Vitamins-Minerals (CENTRUM SILVER PO) Take 1 tablet by mouth daily.    . Polyvinyl  Alcohol-Povidone (REFRESH OP) Place 1 drop into both eyes daily as needed (for dry eyes).    . potassium chloride SA (KLOR-CON M20) 20 MEQ tablet Take 3 tablets (60 mEq total) by mouth 2 (two) times daily. 180 tablet 4  . pravastatin (PRAVACHOL) 20 MG tablet TAKE 1 TABLET BY MOUTH EVERY EVENING 30 tablet 6  . prednisoLONE 5 MG TABS tablet Take 5 mg by mouth daily.    Marland Kitchen PRESCRIPTION MEDICATION Oxygen.   Pt uses 6-8 liters of oxygen daily.    Marland Kitchen  sertraline (ZOLOFT) 50 MG tablet TAKE 1 TABLET BY MOUTH EVERY DAY 30 tablet 8  . SPIRIVA HANDIHALER 18 MCG inhalation capsule INHALE 1 CAPSULE VIA HANDIHALER ONCE DAILY AT THE SAME TIME EVERY DAY 1 capsule 3  . traMADol (ULTRAM) 50 MG tablet Take 100 mg by mouth as needed for moderate pain.     Marland Kitchen zolpidem (AMBIEN) 5 MG tablet Take 5 mg by mouth daily as needed for sleep.    . Tadalafil, PAH, (ADCIRCA) 20 MG TABS Take 1 tablet (20 mg total) by mouth daily. 60 tablet 6  . torsemide (DEMADEX) 20 MG tablet Take 4 tabs by mouth daily for 3 days. Then 3 tabs daily. 180 tablet 3   No current facility-administered medications for this encounter.    BP 120/64 mmHg  Pulse 82  Wt 195 lb 12 oz (88.792 kg)  SpO2 94% General: NAD, obese.  Neck: JVP 10-12 cm, no thyromegaly or thyroid nodule.  Lungs: Distant breath sounds bilaterally.  CV: Nondisplaced PMI.  Heart irregular S1/S2, no S3/S4, no murmur.  2+ edema 1/2 to knees bilaterally.  No carotid bruit.   Abdomen: Soft, nontender, no hepatosplenomegaly, no distention.  Skin: Intact without lesions or rashes.  Neurologic: Alert and oriented x 3.  Psych: Normal affect. Extremities: No clubbing or cyanosis.    Assessment/Plan: 1. Atrial fibrillation: Chronic x years.  She would be unlikely to remain in NSR if cardioversion were attempted.  Plan rate control.  CHADSVASC = 3 at least.  No history of GI bleeding.  Continue apixaban.  Reasonable rate control with bisoprolol.   2. Pulmonary arterial HTN/cor pulmonale:  Significant right heart failure.  Echo today was reviewed.  This showed moderately dilated and mildly dysfunctional RV and PA systolic pressure 73 mmHg mmHg, PA systolic pressure 50/01 on RHC in 6/15 with normal PCWP.  She had minimal improvement with Tyvaso (nearly identical RHC on and off Tyvaso) and actually felt considerably better off Tyvaso.  Patient has a group 3 component to her PAH (OHS/OSA and COPD), but suspect a group 1 contribution that could be related to rheumatological disease (rheumatoid arthritis).  She has had some improvement with Opsumit.  She remains volume overloaded with prominent signs of RV failure.  - Continue home oxygen.  - Continue Opsumit.  - Add Adcirca 20 mg daily.  6 minute walk next appt.  - BMET/BNP today.  3. CKD: BMET today.     4. RA: Being treated (Dr. Amil Amen).  Continues to be limited by joint pain. 5. CHF: Primarily RV failure.  PCWP was normal on last RHC.  On exam today, she appears more volume overloaded. - Stop Lasix, start torsemide 80 mg daily x 3 days then 60 mg daily.  6. Depression: Improved mood with sertraline.   Followup in 2 wks with repeat BMET/BNP.    [name] 12/12/2014

## 2014-12-18 ENCOUNTER — Telehealth (HOSPITAL_COMMUNITY): Payer: Self-pay | Admitting: *Deleted

## 2014-12-18 NOTE — Telephone Encounter (Signed)
Called [phone] for PA of Joanie Coddington, med was approved through 12/18/15, ref # [id number], [facility] aware

## 2014-12-20 ENCOUNTER — Other Ambulatory Visit: Payer: Self-pay | Admitting: Internal Medicine

## 2014-12-23 ENCOUNTER — Other Ambulatory Visit: Payer: Self-pay | Admitting: Emergency Medicine

## 2014-12-25 ENCOUNTER — Encounter: Payer: Self-pay | Admitting: Emergency Medicine

## 2014-12-25 ENCOUNTER — Ambulatory Visit (INDEPENDENT_AMBULATORY_CARE_PROVIDER_SITE_OTHER): Payer: Medicare Other | Admitting: Emergency Medicine

## 2014-12-25 VITALS — BP 106/62 | HR 92 | Ht 64.0 in | Wt 209.0 lb

## 2014-12-25 DIAGNOSIS — J449 Chronic obstructive pulmonary disease, unspecified: Secondary | ICD-10-CM

## 2014-12-25 DIAGNOSIS — I272 Other secondary pulmonary hypertension: Secondary | ICD-10-CM | POA: Diagnosis not present

## 2014-12-25 DIAGNOSIS — IMO0002 Reserved for concepts with insufficient information to code with codable children: Secondary | ICD-10-CM

## 2014-12-25 DIAGNOSIS — G2581 Restless legs syndrome: Secondary | ICD-10-CM | POA: Diagnosis not present

## 2014-12-25 NOTE — Patient Instructions (Signed)
Please continue your current medications as you have been taking them including your Adcirca Continue your oxygen Dr. Aundra Dubin may decide to adjust your Demadex (torsemide) at your visit tomorrow based on your weight gain and your breathing.  You were leg pain may be related to restless leg syndrome. If you don't benefit from gabapentin and we will consider trying to treat this. Follow with Dr Lamonte Sakai in 3 months or sooner if you have any problems.

## 2014-12-25 NOTE — Assessment & Plan Note (Signed)
As noted on her polysomnogram. She is not currently on therapy but she does complain of what sounds like neuropathic pain in her left leg that wakes her up from sleep. It may be that this is RLS.  If the symptoms continue after she is treated with gabapentin then we will consider Requip or other therapy for her restless legs

## 2014-12-25 NOTE — Assessment & Plan Note (Addendum)
With some benefit from albuterol when necessary. We will continue this in addition to her maintenance therapy of Advair and Spiriva.  Continue prednisone 5 mg daily for both COPD and her rheumatoid disease

## 2014-12-25 NOTE — Assessment & Plan Note (Signed)
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 She was recently started on adcirca in addition to opsumit. She has not been on this medication long enough to see its effect. She was also recently changed from her Lasix to torsemide. Over the last 2 weeks she's gained somewhere between 10 and 15 pounds and has been more dyspneic. She is to follow with Dr. Aundra Dubin on 4/22 and will likely have her diuretics adjusted at that visit.    Please continue your current medications as you have been taking them including your Adcirca Continue your oxygen Dr. Aundra Dubin may decide to adjust your Demadex (torsemide) at your visit tomorrow based on your weight gain and your breathing.  You were leg pain may be related to restless leg syndrome. If you don't benefit from gabapentin and we will consider trying to treat this. Follow with Dr Lamonte Sakai in 3 months or sooner if you have any problems.

## 2014-12-25 NOTE — Progress Notes (Signed)
Subjective:  Patient ID: Tracey Duncan, female    DOB: Jun 11, 1946, 69 y.o.   MRN: [id number] HPI 69 yo woman, former tobacco (31 pk-yrs), hx of OSA dx in 4/13 (on CPAP + O2), HTN, A fib (failed cardioversion), allergies. COPD dx in 2002, currently on Advair + Spiriva. Albuterol prn, uses it only . Since moving to [place] in last 6 months she has had more SOB. She is having occasional wheeze, no cough. No CP.  Significant hypoxemia on arrival. Needs a local PCP and cardiologist  ROV 10/30/12 -- COPD, OSA, allergies, A Fib, HTN. She was seen by Dr Aundra Dubin and started anticoagulation for her A fib. Also had TTE that showed PAH with PASP ~ 1mHg. She is wearing her O2 reliably - has a documented desat on O2 at Dr MClaris Gladdenoffice. She is ordered for 6L and 8L with exertion.  Currently on advair, spiriva. Singulair + zyrtec.   ROV 12/05/12 -- COPD, OSA, allergies, A Fib, HTN. Also secondary PAH and severe hypoxemia.  PFT today with moderately severe AFL, no BD response, decreased DLCO. A V/Q on 11/21/12 was normal. Her lasix has been adjusted by Dr MAundra Dubin She is using her oximizer reliably. She is trying to wear CPAP reliably, but still poor sleep - difficulty falling asleep and also staying asleep. Supposed to have ONO on CPAP 4/3.  ROV 01/16/13 --  COPD (FEV1 1.38L), OSA on CPAP, allergies, A Fib, HTN. Also secondary PAH and severe hypoxemia.  Returns for f/u. Reports that her wt has dropped another 8 lbs since our last visit,  breathing well. ONO on CPAP + 3L/min performed > shows 3 discrete episodes of desat that could represent REM-related desats (the report says 2, but it was really 3L/min). Unfortunately about a week ago she developed R UE, hand pain, L knee pain. No f/c, no medication changes or other infectious sx. She follows w [facility].   PULMONARY FUNCTON TEST 12/05/2012  FVC 2.15  FEV1 1.38  FEV1/FVC 64.2  FVC  % Predicted 73  FEV % Predicted 65  FeF 25-75 .65  FeF 25-75 % Predicted 2.44       05/29/13 -- COPD, OSA, newly dx RA. Secondary PAH and hypoxemia. She was started on prednisone, now weaning and being converted to [place] She feels MUCH better, is more active. She is compliant with her oximizer. She wears her CPAP. Compliance data shows that she wears it, she states that it helps her clinically.  She is having more nasal drainage, cough with clear sputum.   09/26/13 -- COPD, OSA, newly dx RA. Secondary PAH and hypoxemia. She tells me that her breathing has been worse over the last several months. R heart cath (11/14): mean RA 8, PA 68/28 mean 45, mean PCWP 11, CI 2.29, PVR 7.2 WU. She was started on Tyvaso by Dr MAundra Dubin has titrated up to 9 puffs qid. She unfortunately feels worse not better, is now limited as to her activity, cannot walk any significant distance (a clear change). She also has now weaned off of prednisone and is on [place] which could be an influence here. She is on Advair and Spiriva.   ROV 11/13/13 -- COPD, OSA, RA, A Fib, secondary PAH and hypoxemia. She has been on Tyvaso since 12/17, 9 puffs since January. She is also on Arava and Pred 5. She feels that the last several months have been characterized by worsening dyspnea, some worsening edema > her lasix has been increased by Dr MAundra Dubinwith  some improvement in edema. Her rheumatolgist believes that she could be off pred from their standpoint. She remains on Spiriva + Advair. She remains on CPAP - but hasn't used it last several weeks due to cough. She has not had repeat R heart cath or TTE on Tyvaso yet. She is getting purulent mucous from her nose, resulting in cough. She is on zyrtec and sigulair.    ROV 04/30/14 -- COPD, OSA, RA, A Fib, secondary PAH and hypoxemia; she is managed with arava + pred. She is now off Tyvaso and she feels better off of it. She has some cough, nagging an prod of white. She has not been wearing her CPAP reliably - has lost 100 lbs since her PSG.    R heart cath 02/06/14 -- Final Conclusions:  RA pressure and PCWP look good, still with moderate pulmonary hypertension. This study really shows no significant change compared to the pre-Tyvaso RHC. I suspect that patient may have [place] primarily due to COPD and OHS/OSA rather than group I PAH.  Recommendations: Will have her hold Tyvaso and see if this makes her feel any worse. If not, will probably stop it.   ROV 05/29/14 -- hx COPD, OSA, RA, A fib, hypoxemia and secondary PAH. Last time we ordered split night PSG, scheduled for 06/23/14. She is on high-flow O2. Feels well - has been active.  She asks today whether she can / should get Tdap immunization, prevnar-13  ROV 08/08/14 -- hx COPD, OSA, RA, A fib, hypoxemia and secondary PAH on opsumit. Her PSG showed hypoxemia without obstructive events, evidence for limb movement disorder.  For the last 5 days she has had increasing congestion, mucous from head and chest. No clear fever.  We discussed her limb movement - she wants to hold off on meds for this now.   ROV 12/25/14 -- hx COPD, nocturnal hypoxemia and restless leg syndrome, RA, A fib, hypoxemia and secondary PAH on opsumit. She was seen by Dr. Melvyn Novas in January.  Her lasix was changed to torsemide about 2 weeks ago. Her wt has gone up about 10-15 lbs, some of this likely fluid. She was more SOB for the last 2 weeks and increased her albuterol use. She remains on opsumit, just started adcirca 4/21. Her last TTE was 12/11/14 as below. Remains  on advair + spiriva, zyrtec, singulair. She is on prednisone 55m       Objective:   Physical Exam Filed Vitals:   12/25/14 1430  BP: 106/62  Pulse: 92  Height: _0  (1.626 m)  Weight: 94.802 kg (209 lb)  SpO2: 91%   Gen: Pleasant, obese, in no distress,  normal affect  ENT: No lesions,  mouth clear,  oropharynx clear, no postnasal drip  Neck: No JVD, no TMG, no carotid bruits  Lungs: No use of accessory muscles, clear without rales or rhonchi  Cardiovascular: RRR, heart sounds normal, no  murmur or gallops, 1+ peripheral edema  Musculoskeletal: No deformities, no cyanosis or clubbing  Neuro: alert, non focal  Skin: Warm, no lesions or rashes   R heart cath 07/11/13  -- RA mean 8  RV 70/7  PA 68/28, mean 45  PCWP mean 11  Oxygen saturations (on 5L home oxygen):  PA 64%  AO 99%  Cardiac Output (Fick) 4.74  Cardiac Index (Fick) 2.29  PVR 7.2 WU   12/11/14 --  Study Conclusions - Left ventricle: The cavity size was normal. Wall thickness was normal. Systolic function was normal. The  estimated ejection fraction was in the range of 55% to 60%. Indeterminant diastolic function (atrial fibrillation). Wall motion was normal; there were no regional wall motion abnormalities. - Mitral valve: Moderately calcified annulus. There was mild regurgitation. - Left atrium: The atrium was severely dilated. - Right ventricle: The cavity size was moderately dilated. Systolic function was mildly reduced. - Right atrium: The atrium was severely dilated. - Tricuspid valve: Peak RV-RA gradient (S): 58 mm Hg. - Pulmonary arteries: PA peak pressure: 73 mm Hg (S). - Systemic veins: IVC measured 2.5 cm with < 50% respirophasic variation, suggesting RA pressure 15 mmHg.      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 She was recently started on adcirca in addition to opsumit. She has not been on this medication long enough to see its effect. She was also recently changed from her Lasix to torsemide. Over the last 2 weeks she's gained somewhere between 10 and 15 pounds and has been more dyspneic. She is to follow with Dr. Aundra Dubin on 4/22 and will likely have her diuretics adjusted at that visit.    Please continue your current medications as you have been taking them  including your Adcirca Continue your oxygen Dr. Aundra Dubin may decide to adjust your Demadex (torsemide) at your visit tomorrow based on your weight gain and your breathing.  You were leg pain may be related to restless leg syndrome. If you don't benefit from gabapentin and we will consider trying to treat this. Follow with Dr Lamonte Sakai in 3 months or sooner if you have any problems.   COPD GOLD II  With some benefit from albuterol when necessary. We will continue this in addition to her maintenance therapy of Advair and Spiriva.  Continue prednisone 5 mg daily for both COPD and her rheumatoid disease   RLS (restless legs syndrome) As noted on her polysomnogram. She is not currently on therapy but she does complain of what sounds like neuropathic pain in her left leg that wakes her up from sleep. It may be that this is RLS.  If the symptoms continue after she is treated with gabapentin then we will consider Requip or other therapy for her restless legs

## 2014-12-26 ENCOUNTER — Ambulatory Visit (HOSPITAL_COMMUNITY)
Admission: RE | Admit: 2014-12-26 | Discharge: 2014-12-26 | Disposition: A | Discharge status: Home / Self Care | Payer: Medicare Other | Source: Ambulatory Visit | Attending: Internal Medicine | Admitting: Internal Medicine

## 2014-12-26 ENCOUNTER — Encounter (HOSPITAL_COMMUNITY): Payer: Self-pay

## 2014-12-26 VITALS — BP 110/60 | HR 82 | Wt 210.2 lb

## 2014-12-26 DIAGNOSIS — I272 Pulmonary hypertension, unspecified: Secondary | ICD-10-CM

## 2014-12-26 DIAGNOSIS — I270 Primary pulmonary hypertension: Secondary | ICD-10-CM

## 2014-12-26 DIAGNOSIS — I482 Chronic atrial fibrillation, unspecified: Secondary | ICD-10-CM

## 2014-12-26 DIAGNOSIS — I5032 Chronic diastolic (congestive) heart failure: Secondary | ICD-10-CM | POA: Diagnosis not present

## 2014-12-26 LAB — BASIC METABOLIC PANEL WITH GFR
Anion gap: 11 (ref 5–15)
BUN: 38 mg/dL — ABNORMAL HIGH (ref 6–23)
CO2: 27 mmol/L (ref 19–32)
Calcium: 8.2 mg/dL — ABNORMAL LOW (ref 8.4–10.5)
Chloride: 102 mmol/L (ref 96–112)
Creatinine, Ser: 1.97 mg/dL — ABNORMAL HIGH (ref 0.50–1.10)
GFR calc Af Amer: 29 mL/min — ABNORMAL LOW
GFR calc non Af Amer: 25 mL/min — ABNORMAL LOW
Glucose, Bld: 152 mg/dL — ABNORMAL HIGH (ref 70–99)
Potassium: 5.2 mmol/L — ABNORMAL HIGH (ref 3.5–5.1)
Sodium: 140 mmol/L (ref 135–145)

## 2014-12-26 LAB — BRAIN NATRIURETIC PEPTIDE: B Natriuretic Peptide: 274.5 pg/mL — ABNORMAL HIGH (ref 0.0–100.0)

## 2014-12-26 MED ORDER — TORSEMIDE 20 MG PO TABS: ORAL_TABLET | ORAL | Status: DC

## 2014-12-26 MED ORDER — POTASSIUM CHLORIDE CRYS ER 20 MEQ PO TBCR: EXTENDED_RELEASE_TABLET | ORAL | Status: DC

## 2014-12-26 NOTE — Patient Instructions (Signed)
INCREASE Torsemide to 80 mg (4 tablets) in am and 40 mg (2 tablets) in pm.  INCREASE Potassium to 80 meq (4 tablets) in am and 60 meq (3 tablets) in pm.  Return in 2 weeks for follow up with lab work.  Do the following things EVERYDAY: 1) Weigh yourself in the morning before breakfast. Write it down and keep it in a log. 2) Take your medicines as prescribed 3) Eat low salt foods-Limit salt (sodium) to 2000 mg per day.  4) Stay as active as you can everyday 5) Limit all fluids for the day to less than 2 liters

## 2014-12-26 NOTE — Progress Notes (Signed)
Patient ID: Tracey Duncan, female   DOB: 28-Dec-1945, 69 y.o.   MRN: [id number] PCP: Asa Lente  69 yo with history of COPD on home oxygen, OHS/OSA, rheumatoid arthritis, and chronic atrial fibrillation presents for cardiology followup.  She was diagnosed with atrial fibrillation around 2004. She failed cardioversion and it has been chronic.  She has noted exertional dyspnea since at least 2012.  She was diagnosed with COPD back in 2002.  She no longer smokes.  She had been on CPAP for OSA prior, but was started on home oxygen all the time by Dr. Lamonte Sakai.  No tachypalpitations, no chest pain.  Echo in 2/14 showed a moderately dilated RV with moderately decreased systolic function, PA systolic pressure 65 mmHg. She was started on apixaban.   V/Q scan was negative for chronic PE.  She has been diagnosed with rheumatoid arthritis and is on [name].  I did a right heart cath in 11/14 that confirmed moderate to severe PAH with PVR 7.4 WU.  In 12/14, I started her on Tyvaso. I did a repeat RHC in 6/15.  This was nearly identical to the prior study in 11/14. Given apparent minimal response to Tyvaso, I had her try coming off the medication.  Off Tyvaso, she felt that her breathing was definitely better.  She is now Opsumit, which she feels has helped.  Last 6 min walk was improved.   At last appointment, I switched Mrs Sulak over to torsemide due to volume overload.  However, she has continued to gain weight, up 15 lbs.  She has had increased dyspnea x 1.5 weeks.  She is not short of breath walking around her house but is short of breath walking longer distances.  No orthopnea/PND.  No chest pain.  No lightheadedness or syncope.  She just started Adcirca about 3 days ago (got it in the mail).    6 minute walk 11/15 122 m 6 minute walk 3/16 158 m  Labs (4/14): RF < 10, ANA negative, BNP 294, K 3.8, creatinine 1.7  Labs (9/14): K 4.5, creatinine 1.74 Labs (11/14): K 4.5=>3.9, creatinine 1.6, BNP 300 Labs (2/15): K 4.3,  creatinine 1.6=>1.5, BNP 383=>311, LDL 72, HDL 49 Labs (3/15): K 4.2, creatinine 1.5 Labs (5/15): K 4.6, creatinine 1.5 Labs (6/15): HCT 32.1, TSH normal, HCT 32.1 Labs (11/15): K 4.6, creatinine 1.57 Labs (12/15): TSH normal Labs (3/16): K 4.4, creatinine 1.67, BNP 294, HCT 37.3 Labs (4/16): K 4.1, creatinine 1.6, BNP 249  PMH: 1. OSA/OHS: On CPAP at night and oxygen during the day.  However, sleep study (11/15) did not show significant OSA.  2. COPD: Diagnosed 2002.  FEV1 65% predicted in 4/14.  3. HTN 4. Chronic atrial fibrillation: Failed DCCV in 12/04.   5. Pulmonary HTN/cor pulmonale: Suspect this partly due to COPD and OHS/OSA (group 3) but cannot rule out group 1 component (has RA).  Echo (2/14) with EF 55-60%, moderately dilated RV with moderately decreased systolic function, PA systolic pressure 65 mmHg.  V/Q scan in 3/14 was negative for chronic PE. PFTs (4/14): FEV1 65% predicted, mild obstructive defect with severely decreased DLCO.  RHC (11/14): mean RA 8, PA 68/28 mean 45, mean PCWP 11, CI 2.29, PVR 7.2 WU. 6 minute walk (2/15) 126 meters.  6 minute walk (3/15) 118 m.  She started Tyvaso.  Repeat RHC in 6/15 on Tyvaso with mean RA 7, PA 65/28 mean 43, mean PCWP 16, PVR 6.2 WU, CI 2.23.  Given minimal change, Tyvaso was  stopped and patient actually felt better. Echo (4/16) with EF 55-60%, severe biatrial enlargement, moderate RV dilation with mildly decreased systolic function, PA systolic pressure 73 mmHg.  6. Lexiscan Thallium in 11/11 with no evidence for ischemia or infarction.  7. Impaired fasting glucose. 8. Hypothyroidism 9. Obesity. 10. CKD 11. Rheumatoid arthritis 12. ABIs (11/14): Normal 13. Hyperlipidemia: Myalgias with Crestor.  14. Depression 15. Chronic diastolic CHF 16. Gout  SH: Widow, moved to [place] from [place] to live with sister.  Quit smoking in 1993.  Rare ETOH. Retired Optometrist.   FH: Brother with MI at 23, father with MI at 55.  ROS: All systems  reviewed and negative except as per HPI.   Current Outpatient Prescriptions  Medication Sig Dispense Refill  . acetaminophen (TYLENOL) 500 MG tablet Take 1,000 mg by mouth daily as needed (for arthritic pain).    . ADVAIR DISKUS 250-50 MCG/DOSE AEPB INHALE 1 PUFF INTO THE LUNGS EVERY 12 (TWELVE) HOURS. 60 each 5  . albuterol (PROAIR HFA) 108 (90 BASE) MCG/ACT inhaler Inhale 2 puffs into the lungs every 4 (four) hours as needed for wheezing or shortness of breath.     . Benzocaine (ANBESOL MT) Use as directed 1 application in the mouth or throat 2 (two) times daily as needed (for dry mouth).    . bisoprolol (ZEBETA) 10 MG tablet TAKE 1 TABLET EVERY DAY 30 tablet 6  . calcium carbonate (TUMS - DOSED IN MG ELEMENTAL CALCIUM) 500 MG chewable tablet Chew 2 tablets by mouth daily as needed for indigestion or heartburn.     . calcium-vitamin D (OSCAL WITH D) 500-200 MG-UNIT per tablet Take 1 tablet by mouth daily.    . cetirizine (ZYRTEC) 10 MG tablet Take 10 mg by mouth at bedtime.     . cholecalciferol (VITAMIN D) 1000 UNITS tablet Take 1,000 Units by mouth daily.    Marland Kitchen ELIQUIS 5 MG TABS tablet TAKE 1 TABLET BY MOUTH TWICE A DAY 60 tablet 0  . febuxostat (ULORIC) 40 MG tablet Take 80 mg by mouth daily.     Marland Kitchen gabapentin (NEURONTIN) 300 MG capsule Take 300 mg by mouth daily.    Marland Kitchen ipratropium-albuterol (DUONEB) 0.5-2.5 (3) MG/3ML SOLN Take 3 mLs by nebulization every 4 (four) hours as needed.     . leflunomide (ARAVA) 20 MG tablet Take 20 mg by mouth daily.    Marland Kitchen levothyroxine (SYNTHROID, LEVOTHROID) 50 MCG tablet Take 1 tablet (50 mcg total) by mouth daily before breakfast. 90 tablet 3  . Macitentan (OPSUMIT) 10 MG TABS Take 10 mg by mouth daily. 30 tablet 6  . Magnesium 400 MG CAPS Take 1 capsule by mouth daily.    . Melatonin 5 MG CAPS Take 1 capsule by mouth at bedtime as needed.     . metFORMIN (GLUCOPHAGE) 500 MG tablet Take 1 tablet (500 mg total) by mouth 2 (two) times daily with a meal. 180  tablet 3  . montelukast (SINGULAIR) 10 MG tablet Take 10 mg by mouth at bedtime.    . Multiple Vitamins-Minerals (CENTRUM SILVER PO) Take 1 tablet by mouth daily.    . Polyvinyl Alcohol-Povidone (REFRESH OP) Place 1 drop into both eyes daily as needed (for dry eyes).    . potassium chloride SA (KLOR-CON M20) 20 MEQ tablet Take 80 meq (4 tablets) in am and 60 meq (3 tablets) in pm 180 tablet 4  . pravastatin (PRAVACHOL) 20 MG tablet TAKE 1 TABLET BY MOUTH EVERY EVENING 30 tablet  6  . prednisoLONE 5 MG TABS tablet Take 5 mg by mouth daily.    Marland Kitchen PRESCRIPTION MEDICATION Oxygen.   Pt uses 6-8 liters of oxygen daily.    . sertraline (ZOLOFT) 50 MG tablet TAKE 1 TABLET BY MOUTH EVERY DAY 30 tablet 8  . SPIRIVA HANDIHALER 18 MCG inhalation capsule INHALE 1 CAPSULE VIA HANDIHALER ONCE DAILY AT THE SAME TIME EVERY DAY 1 capsule 3  . Tadalafil, PAH, (ADCIRCA) 20 MG TABS Take 1 tablet (20 mg total) by mouth daily. 60 tablet 6  . torsemide (DEMADEX) 20 MG tablet Take 15m (4 tablets) in am and 47m(2 tablets) in pm. 180 tablet 3  . traMADol (ULTRAM) 50 MG tablet Take 100 mg by mouth as needed for moderate pain.     . Colchicine 0.6 MG CAPS Take 1 capsule by mouth daily as needed.     . zolpidem (AMBIEN) 5 MG tablet Take 5 mg by mouth daily as needed for sleep.     No current facility-administered medications for this encounter.    BP 110/60 mmHg  Pulse 82  Wt 210 lb 4 oz (95.369 kg)  SpO2 90% General: NAD, obese.  Neck: JVP 14 cm, no thyromegaly or thyroid nodule.  Lungs: Distant breath sounds bilaterally. Crackles left base.  CV: Nondisplaced PMI.  Heart irregular S1/S2, no S3/S4, no murmur. 1+ edema to knees bilaterally.  No carotid bruit.   Abdomen: Soft, nontender, no hepatosplenomegaly, no distention.  Skin: Intact without lesions or rashes.  Neurologic: Alert and oriented x 3.  Psych: Normal affect. Extremities: No clubbing or cyanosis.    Assessment/Plan: 1. Atrial fibrillation: Chronic  x years.  She would be unlikely to remain in NSR if cardioversion were attempted.  Plan rate control.  CHADSVASC = 3 at least.  No history of GI bleeding.  Continue apixaban.  Reasonable rate control with bisoprolol.   2. Pulmonary arterial HTN/cor pulmonale: Significant right heart failure.  Echo in 4/16 showed moderately dilated and mildly dysfunctional RV and PA systolic pressure 73 mmHg mmHg.  PA systolic pressure 6521/62n RHC in 6/15 with normal PCWP.  She had minimal improvement with Tyvaso (nearly identical RHC on and off Tyvaso) and actually felt considerably better off Tyvaso.  Patient has a group 3 component to her PAH (OHS/OSA and COPD), but suspect a group 1 contribution that could be related to rheumatological disease (rheumatoid arthritis).  She has had some improvement with Opsumit and recently started Adcirca.  She remains volume overloaded with prominent signs of RV failure.  - Continue home oxygen.  - Continue Opsumit 10 daily and Adcirca 20 daily.  Given increased volume overload today, will defer 6 minute walk until she is more euvolemic.  - BMET/BNP today.  3. CKD: BMET today.     4. RA: Being treated (Dr. BeAmil Amen  Continues to be limited by joint pain. 5. CHF: Primarily RV failure.  PCWP was normal on last RHC.  On exam today, she appears more volume overloaded. - Increase torsemide to 80 qam, 40 qpm.  BMET today and repeat in 10 days.  6. Depression: Improved mood with sertraline.   Followup in 10 days with repeat BMET/BNP.    DaLoralie Champagne/22/2016

## 2014-12-29 ENCOUNTER — Telehealth (HOSPITAL_COMMUNITY): Payer: Self-pay

## 2014-12-29 NOTE — Telephone Encounter (Signed)
LVMTCB.  Per Dr. Aundra Dubin, K is high, cut back on KCl to 40 bid for now, will need BMET in week.

## 2014-12-29 NOTE — Telephone Encounter (Signed)
Patient returned call to review lab results and med change of potassium to 40 meq twice daily.  Rx updated to preferred pharmacy electronically.  Patient aware.  Renee Pain

## 2014-12-30 ENCOUNTER — Telehealth (HOSPITAL_COMMUNITY): Payer: Self-pay | Admitting: *Deleted

## 2014-12-30 NOTE — Telephone Encounter (Signed)
Per Dr. Aundra Dubin patient should stay on Torsemide 66m in am, 40 mg in pm.  Attempted to call patient to make her aware of this and confirm that she is taking medications correctly but did not answer.  Will attempt to recall tomorrow.  BRenee Pain

## 2014-12-30 NOTE — Telephone Encounter (Signed)
Pt said Dr. Aundra Dubin changed her medication from lasix to torsemide at her last office visit 12/26/14. She said this medication is not working she has swelling in her feet ankles and toes.  Pts weight has not gone down.  Pt wants to switch back to lasix. Her current dose of torsemide is 34m am and 443mpm.

## 2015-01-08 ENCOUNTER — Other Ambulatory Visit: Payer: Self-pay | Admitting: Internal Medicine

## 2015-01-08 ENCOUNTER — Other Ambulatory Visit: Payer: Self-pay | Admitting: Cardiology

## 2015-01-13 ENCOUNTER — Ambulatory Visit (HOSPITAL_COMMUNITY)
Admission: RE | Admit: 2015-01-13 | Discharge: 2015-01-13 | Disposition: A | Discharge status: Home / Self Care | Payer: Medicare Other | Source: Ambulatory Visit | Attending: Cardiology | Admitting: Cardiology

## 2015-01-13 ENCOUNTER — Other Ambulatory Visit: Payer: Self-pay | Admitting: Emergency Medicine

## 2015-01-13 ENCOUNTER — Encounter (HOSPITAL_COMMUNITY): Payer: Self-pay

## 2015-01-13 VITALS — BP 105/53 | HR 90 | Resp 20 | Wt 205.0 lb

## 2015-01-13 DIAGNOSIS — M069 Rheumatoid arthritis, unspecified: Secondary | ICD-10-CM | POA: Diagnosis not present

## 2015-01-13 DIAGNOSIS — I129 Hypertensive chronic kidney disease with stage 1 through stage 4 chronic kidney disease, or unspecified chronic kidney disease: Secondary | ICD-10-CM | POA: Diagnosis not present

## 2015-01-13 DIAGNOSIS — E039 Hypothyroidism, unspecified: Secondary | ICD-10-CM | POA: Insufficient documentation

## 2015-01-13 DIAGNOSIS — E669 Obesity, unspecified: Secondary | ICD-10-CM | POA: Insufficient documentation

## 2015-01-13 DIAGNOSIS — F329 Major depressive disorder, single episode, unspecified: Secondary | ICD-10-CM | POA: Diagnosis not present

## 2015-01-13 DIAGNOSIS — N189 Chronic kidney disease, unspecified: Secondary | ICD-10-CM | POA: Insufficient documentation

## 2015-01-13 DIAGNOSIS — Z79899 Other long term (current) drug therapy: Secondary | ICD-10-CM | POA: Insufficient documentation

## 2015-01-13 DIAGNOSIS — I5032 Chronic diastolic (congestive) heart failure: Secondary | ICD-10-CM | POA: Diagnosis not present

## 2015-01-13 DIAGNOSIS — Z7902 Long term (current) use of antithrombotics/antiplatelets: Secondary | ICD-10-CM | POA: Insufficient documentation

## 2015-01-13 DIAGNOSIS — Z8249 Family history of ischemic heart disease and other diseases of the circulatory system: Secondary | ICD-10-CM | POA: Diagnosis not present

## 2015-01-13 DIAGNOSIS — I270 Primary pulmonary hypertension: Secondary | ICD-10-CM

## 2015-01-13 DIAGNOSIS — I272 Other secondary pulmonary hypertension: Secondary | ICD-10-CM | POA: Diagnosis not present

## 2015-01-13 DIAGNOSIS — Z9981 Dependence on supplemental oxygen: Secondary | ICD-10-CM | POA: Diagnosis not present

## 2015-01-13 DIAGNOSIS — Z87891 Personal history of nicotine dependence: Secondary | ICD-10-CM | POA: Diagnosis not present

## 2015-01-13 DIAGNOSIS — I482 Chronic atrial fibrillation, unspecified: Secondary | ICD-10-CM

## 2015-01-13 DIAGNOSIS — J449 Chronic obstructive pulmonary disease, unspecified: Secondary | ICD-10-CM | POA: Diagnosis not present

## 2015-01-13 DIAGNOSIS — E785 Hyperlipidemia, unspecified: Secondary | ICD-10-CM | POA: Diagnosis not present

## 2015-01-13 LAB — BASIC METABOLIC PANEL
ANION GAP: 11 (ref 5–15)
BUN: 34 mg/dL — AB (ref 6–20)
CALCIUM: 8.6 mg/dL — AB (ref 8.9–10.3)
CHLORIDE: 102 mmol/L (ref 101–111)
CO2: 27 mmol/L (ref 22–32)
Creatinine, Ser: 1.81 mg/dL — ABNORMAL HIGH (ref 0.44–1.00)
GFR, EST AFRICAN AMERICAN: 32 mL/min — AB (ref >60)
GFR, EST NON AFRICAN AMERICAN: 28 mL/min — AB (ref >60)
GLUCOSE: 115 mg/dL — AB (ref 70–99)
Potassium: 4.5 mmol/L (ref 3.5–5.1)
Sodium: 140 mmol/L (ref 135–145)

## 2015-01-13 LAB — BRAIN NATRIURETIC PEPTIDE: B Natriuretic Peptide: 239.1 pg/mL — ABNORMAL HIGH (ref 0.0–100.0)

## 2015-01-13 MED ORDER — METOLAZONE 2.5 MG PO TABS: 2.5000 mg | ORAL_TABLET | ORAL | Status: DC

## 2015-01-13 MED ORDER — METOLAZONE 2.5 MG PO TABS: 2.5000 mg | ORAL_TABLET | Freq: Every day | ORAL | Status: DC | PRN

## 2015-01-13 MED ORDER — METOLAZONE 2.5 MG PO TABS: 2.5000 mg | ORAL_TABLET | Freq: Every day | ORAL | Status: DC

## 2015-01-13 NOTE — Patient Instructions (Addendum)
Take 1 tablet of Metolazone 2.15m once every WEDNESDAY morning 30 minutes before your morning dose of Torsemide.  Follow up 1 week with Dr. MAundra Dubin  Do the following things EVERYDAY: 1) Weigh yourself in the morning before breakfast. Write it down and keep it in a log. 2) Take your medicines as prescribed 3) Eat low salt foods-Limit salt (sodium) to 2000 mg per day.  4) Stay as active as you can everyday 5) Limit all fluids for the day to less than 2 liters

## 2015-01-13 NOTE — H&P (Signed)
Patient ID: Tracey Duncan, female   DOB: [birth year], 69 y.o.   MRN: [id number] PCP: Asa Lente  69 yo with history of COPD on home oxygen, OHS/OSA, rheumatoid arthritis, and chronic atrial fibrillation presents for cardiology followup.  She was diagnosed with atrial fibrillation around 2004. She failed cardioversion and it has been chronic.  She has noted exertional dyspnea since at least 2012.  She was diagnosed with COPD back in 2002.  She no longer smokes.  She had been on CPAP for OSA prior, but was started on home oxygen all the time by Dr. Lamonte Sakai.  No tachypalpitations, no chest pain.  Echo in 2/14 showed a moderately dilated RV with moderately decreased systolic function, PA systolic pressure 65 mmHg. She was started on apixaban.   V/Q scan was negative for chronic PE.  She has been diagnosed with rheumatoid arthritis and is on [name].  I did a right heart cath in 11/14 that confirmed moderate to severe PAH with PVR 7.4 WU.  In 12/14, I started her on Tyvaso.Repeat RHC in 6/15.  This was nearly identical to the prior study in 11/14. Given apparent minimal response to Tyvaso, I had her try coming off the medication.  Off Tyvaso, she felt that her breathing was definitely better.  She is now Opsumit, which she feels has helped.   Most recently, Adcirca was started.   She return for HF and pulmonary HTN follow up. Last visit diuretics increased. Home weight down from 206 to 199 pounds. Remains SOB with exertion.Wears oxygen in the shower.  Denies syncope. Intermittent presyncope. Oxygen with ambulation increased to 8 liters and 4 liters at rest. Ongoing leg edema.      6 minute walk 11/15 122 m 6 minute walk 3/16 158 m  Labs (4/14): RF < 10, ANA negative, BNP 294, K 3.8, creatinine 1.7  Labs (9/14): K 4.5, creatinine 1.74 Labs (11/14): K 4.5=>3.9, creatinine 1.6, BNP 300 Labs (2/15): K 4.3, creatinine 1.6=>1.5, BNP 383=>311, LDL 72, HDL 49 Labs (3/15): K 4.2, creatinine 1.5 Labs (5/15): K 4.6,  creatinine 1.5 Labs (6/15): HCT 32.1, TSH normal, HCT 32.1 Labs (11/15): K 4.6, creatinine 1.57 Labs (12/15): TSH normal Labs (3/16): K 4.4, creatinine 1.67, BNP 294, HCT 37.3 Labs (4/16): K 4.1, creatinine 1.6, BNP 249 Labs 94/22/2016): K 5.2 Creatinine 1.97   PMH: 1. OSA/OHS: On CPAP at night and oxygen during the day.  However, sleep study (11/15) did not show significant OSA.  2. COPD: Diagnosed 2002.  FEV1 65% predicted in 4/14.  3. HTN 4. Chronic atrial fibrillation: Failed DCCV in 12/04.   5. Pulmonary HTN/cor pulmonale: Suspect this partly due to COPD and OHS/OSA (group 3) but cannot rule out group 1 component (has RA).  Echo (2/14) with EF 55-60%, moderately dilated RV with moderately decreased systolic function, PA systolic pressure 65 mmHg.  V/Q scan in 3/14 was negative for chronic PE. PFTs (4/14): FEV1 65% predicted, mild obstructive defect with severely decreased DLCO.  RHC (11/14): mean RA 8, PA 68/28 mean 45, mean PCWP 11, CI 2.29, PVR 7.2 WU. 6 minute walk (2/15) 126 meters.  6 minute walk (3/15) 118 m.  She started Tyvaso.  Repeat RHC in 6/15 on Tyvaso with mean RA 7, PA 65/28 mean 43, mean PCWP 16, PVR 6.2 WU, CI 2.23.  Given minimal change, Tyvaso was stopped and patient actually felt better. Echo (4/16) with EF 55-60%, severe biatrial enlargement, moderate RV dilation with mildly decreased systolic function, PA systolic pressure  73 mmHg.  6. Lexiscan Thallium in 11/11 with no evidence for ischemia or infarction.  7. Impaired fasting glucose. 8. Hypothyroidism 9. Obesity. 10. CKD 11. Rheumatoid arthritis 12. ABIs (11/14): Normal 13. Hyperlipidemia: Myalgias with Crestor.  14. Depression 15. Chronic diastolic CHF 16. Gout  SH: Widow, moved to [place] from [place] to live with sister.  Quit smoking in 1993.  Rare ETOH. Retired Optometrist.   FH: Brother with MI at 54, father with MI at 55.  ROS: All systems reviewed and negative except as per HPI.   Current Outpatient  Prescriptions  Medication Sig Dispense Refill  . acetaminophen (TYLENOL) 500 MG tablet Take 1,000 mg by mouth daily as needed (for arthritic pain).    . ADVAIR DISKUS 250-50 MCG/DOSE AEPB INHALE 1 PUFF INTO THE LUNGS EVERY 12 (TWELVE) HOURS. 60 each 5  . albuterol (PROAIR HFA) 108 (90 BASE) MCG/ACT inhaler Inhale 2 puffs into the lungs every 4 (four) hours as needed for wheezing or shortness of breath.     . Benzocaine (ANBESOL MT) Use as directed 1 application in the mouth or throat 2 (two) times daily as needed (for dry mouth).    . bisoprolol (ZEBETA) 10 MG tablet TAKE 1 TABLET EVERY DAY 30 tablet 6  . calcium carbonate (TUMS - DOSED IN MG ELEMENTAL CALCIUM) 500 MG chewable tablet Chew 2 tablets by mouth daily as needed for indigestion or heartburn.     . calcium-vitamin D (OSCAL WITH D) 500-200 MG-UNIT per tablet Take 1 tablet by mouth daily.    . cetirizine (ZYRTEC) 10 MG tablet Take 10 mg by mouth at bedtime.     . cholecalciferol (VITAMIN D) 1000 UNITS tablet Take 1,000 Units by mouth daily.    . Colchicine 0.6 MG CAPS Take 1 capsule by mouth daily as needed.     Marland Kitchen ELIQUIS 5 MG TABS tablet TAKE 1 TABLET BY MOUTH TWICE A DAY 60 tablet 0  . febuxostat (ULORIC) 40 MG tablet Take 80 mg by mouth daily.     Marland Kitchen gabapentin (NEURONTIN) 300 MG capsule Take 300 mg by mouth daily.    Marland Kitchen ipratropium-albuterol (DUONEB) 0.5-2.5 (3) MG/3ML SOLN Take 3 mLs by nebulization every 4 (four) hours as needed.     . leflunomide (ARAVA) 20 MG tablet Take 20 mg by mouth daily.    Marland Kitchen levothyroxine (SYNTHROID, LEVOTHROID) 50 MCG tablet Take 1 tablet (50 mcg total) by mouth daily before breakfast. 90 tablet 3  . Macitentan (OPSUMIT) 10 MG TABS Take 10 mg by mouth daily. 30 tablet 6  . Magnesium 400 MG CAPS Take 1 capsule by mouth daily.    . Melatonin 5 MG CAPS Take 1 capsule by mouth at bedtime as needed.     . metFORMIN (GLUCOPHAGE) 500 MG tablet Take 1 tablet (500 mg total) by mouth 2 (two) times daily with a meal.  180 tablet 3  . montelukast (SINGULAIR) 10 MG tablet Take 10 mg by mouth at bedtime.    . Multiple Vitamins-Minerals (CENTRUM SILVER PO) Take 1 tablet by mouth daily.    . Polyvinyl Alcohol-Povidone (REFRESH OP) Place 1 drop into both eyes daily as needed (for dry eyes).    . potassium chloride SA (K-DUR,KLOR-CON) 20 MEQ tablet Take 40 mEq by mouth 2 (two) times daily.    . pravastatin (PRAVACHOL) 20 MG tablet TAKE 1 TABLET BY MOUTH EVERY EVENING 30 tablet 6  . prednisoLONE 5 MG TABS tablet Take 5 mg by mouth daily.    Marland Kitchen  PRESCRIPTION MEDICATION Oxygen.   Pt uses 6-8 liters of oxygen daily.    . sertraline (ZOLOFT) 50 MG tablet TAKE 1 TABLET BY MOUTH EVERY DAY 30 tablet 8  . SPIRIVA HANDIHALER 18 MCG inhalation capsule INHALE 1 CAPSULE VIA HANDIHALER ONCE DAILY AT THE SAME TIME EVERY DAY 1 capsule 3  . Tadalafil, PAH, (ADCIRCA) 20 MG TABS Take 1 tablet (20 mg total) by mouth daily. 60 tablet 6  . torsemide (DEMADEX) 20 MG tablet Take 24m (4 tablets) in am and 440m(2 tablets) in pm. 180 tablet 3  . traMADol (ULTRAM) 50 MG tablet Take 100 mg by mouth as needed for moderate pain.     . Marland Kitchenolpidem (AMBIEN) 5 MG tablet Take 5 mg by mouth daily as needed for sleep.     No current facility-administered medications for this encounter.    BP 105/53 mmHg  Pulse 90  Resp 20  Wt 205 lb (92.987 kg)  SpO2 86% General: NAD, obese.  Neck: JVP 10 cm, no thyromegaly or thyroid nodule.  Lungs: Distant breath sounds bilaterally. Crackles left base.  CV: Nondisplaced PMI.  Heart irregular S1/S2, no S3/S4, no murmur. 1+ edema to knees bilaterally.  No carotid bruit.   Abdomen: Soft, nontender, no hepatosplenomegaly, no distention.  Skin: Intact without lesions or rashes.  Neurologic: Alert and oriented x 3.  Psych: Normal affect. Extremities: No clubbing or cyanosis.    Assessment/Plan: 1. Atrial fibrillation: Chronic x years.  She would be unlikely to remain in NSR if cardioversion were attempted.  Plan  rate control.  CHADSVASC = 3 at least.  No history of GI bleeding.  Continue apixaban.  Reasonable rate control with bisoprolol.   2. Pulmonary arterial HTN/cor pulmonale: Significant right heart failure.  Echo in 4/16 showed moderately dilated and mildly dysfunctional RV and PA systolic pressure 73 mmHg mmHg.  PA systolic pressure 6510/62n RHC in 6/15 with normal PCWP.  She had minimal improvement with Tyvaso (nearly identical RHC on and off Tyvaso) and actually felt considerably better off Tyvaso.  Patient has a group 3 component to her PAH (OHS/OSA and COPD), but suspect a group 1 contribution that could be related to rheumatological disease (rheumatoid arthritis).  She has had some improvement with Opsumit and recently started Adcirca.  She remains volume overloaded with prominent signs of RV failure.  - Continue home oxygen.  - Continue Opsumit 10 daily and Adcirca 20 daily.   - BMET/BNP today.  3. CKD: BMET today.     4. RA: Being treated (Dr. BeAmil Amen  Continues to be limited by joint pain. 5. CHF: Primarily RV failure.  - Continue torsemide 80 qam/40 qpm - Add metolazone 2.5 mg pre-am torsemide on Wednesdays.   6. Depression: Improved mood with sertraline.   Followup 1 week.     CLEGG,AMY 01/13/2015  Patient seen with NP, agree with the above note.  Still volume overloaded.  Will start her on metolazone 2.5 mg pre-torsemide every Wednesday starting tomorrow.  BMET in 1 week, followup in a week.   DaLoralie Champagne/06/2015

## 2015-01-20 ENCOUNTER — Inpatient Hospital Stay (HOSPITAL_COMMUNITY): Payer: Medicare Other

## 2015-01-20 ENCOUNTER — Other Ambulatory Visit: Payer: Self-pay

## 2015-01-20 ENCOUNTER — Inpatient Hospital Stay (HOSPITAL_COMMUNITY)
Admission: AD | Admit: 2015-01-20 | Discharge: 2015-01-26 | DRG: 286 | Disposition: A | Discharge status: Home Health | Payer: Medicare Other | Source: Ambulatory Visit | Attending: Cardiology | Admitting: Cardiology

## 2015-01-20 ENCOUNTER — Ambulatory Visit (HOSPITAL_COMMUNITY)
Admission: RE | Admit: 2015-01-20 | Discharge: 2015-01-20 | Disposition: A | Discharge status: Home / Self Care | Payer: Medicare Other | Source: Ambulatory Visit | Attending: Cardiology | Admitting: Cardiology

## 2015-01-20 VITALS — BP 112/78 | HR 118 | Wt 201.1 lb

## 2015-01-20 DIAGNOSIS — E785 Hyperlipidemia, unspecified: Secondary | ICD-10-CM | POA: Diagnosis present

## 2015-01-20 DIAGNOSIS — J189 Pneumonia, unspecified organism: Secondary | ICD-10-CM | POA: Diagnosis present

## 2015-01-20 DIAGNOSIS — I5033 Acute on chronic diastolic (congestive) heart failure: Secondary | ICD-10-CM

## 2015-01-20 DIAGNOSIS — Z7901 Long term (current) use of anticoagulants: Secondary | ICD-10-CM | POA: Diagnosis not present

## 2015-01-20 DIAGNOSIS — J40 Bronchitis, not specified as acute or chronic: Secondary | ICD-10-CM | POA: Insufficient documentation

## 2015-01-20 DIAGNOSIS — Z90710 Acquired absence of both cervix and uterus: Secondary | ICD-10-CM

## 2015-01-20 DIAGNOSIS — I2781 Cor pulmonale (chronic): Secondary | ICD-10-CM | POA: Diagnosis present

## 2015-01-20 DIAGNOSIS — Z87891 Personal history of nicotine dependence: Secondary | ICD-10-CM | POA: Diagnosis not present

## 2015-01-20 DIAGNOSIS — Z9981 Dependence on supplemental oxygen: Secondary | ICD-10-CM | POA: Diagnosis not present

## 2015-01-20 DIAGNOSIS — M069 Rheumatoid arthritis, unspecified: Secondary | ICD-10-CM | POA: Diagnosis present

## 2015-01-20 DIAGNOSIS — I10 Essential (primary) hypertension: Secondary | ICD-10-CM | POA: Diagnosis present

## 2015-01-20 DIAGNOSIS — M1990 Unspecified osteoarthritis, unspecified site: Secondary | ICD-10-CM | POA: Diagnosis present

## 2015-01-20 DIAGNOSIS — Z7952 Long term (current) use of systemic steroids: Secondary | ICD-10-CM

## 2015-01-20 DIAGNOSIS — N183 Chronic kidney disease, stage 3 (moderate): Secondary | ICD-10-CM

## 2015-01-20 DIAGNOSIS — G4733 Obstructive sleep apnea (adult) (pediatric): Secondary | ICD-10-CM | POA: Diagnosis present

## 2015-01-20 DIAGNOSIS — J449 Chronic obstructive pulmonary disease, unspecified: Secondary | ICD-10-CM | POA: Diagnosis not present

## 2015-01-20 DIAGNOSIS — R0902 Hypoxemia: Secondary | ICD-10-CM | POA: Diagnosis not present

## 2015-01-20 DIAGNOSIS — I482 Chronic atrial fibrillation, unspecified: Secondary | ICD-10-CM | POA: Diagnosis present

## 2015-01-20 DIAGNOSIS — I509 Heart failure, unspecified: Secondary | ICD-10-CM | POA: Diagnosis not present

## 2015-01-20 DIAGNOSIS — E039 Hypothyroidism, unspecified: Secondary | ICD-10-CM | POA: Diagnosis present

## 2015-01-20 DIAGNOSIS — E119 Type 2 diabetes mellitus without complications: Secondary | ICD-10-CM | POA: Diagnosis present

## 2015-01-20 DIAGNOSIS — J441 Chronic obstructive pulmonary disease with (acute) exacerbation: Secondary | ICD-10-CM | POA: Diagnosis present

## 2015-01-20 DIAGNOSIS — I129 Hypertensive chronic kidney disease with stage 1 through stage 4 chronic kidney disease, or unspecified chronic kidney disease: Secondary | ICD-10-CM | POA: Diagnosis present

## 2015-01-20 DIAGNOSIS — J810 Acute pulmonary edema: Secondary | ICD-10-CM | POA: Diagnosis not present

## 2015-01-20 DIAGNOSIS — Z79899 Other long term (current) drug therapy: Secondary | ICD-10-CM | POA: Diagnosis not present

## 2015-01-20 DIAGNOSIS — I272 Other secondary pulmonary hypertension: Secondary | ICD-10-CM | POA: Diagnosis present

## 2015-01-20 DIAGNOSIS — I270 Primary pulmonary hypertension: Secondary | ICD-10-CM | POA: Diagnosis not present

## 2015-01-20 DIAGNOSIS — J9621 Acute and chronic respiratory failure with hypoxia: Secondary | ICD-10-CM | POA: Diagnosis present

## 2015-01-20 DIAGNOSIS — N189 Chronic kidney disease, unspecified: Secondary | ICD-10-CM | POA: Diagnosis present

## 2015-01-20 DIAGNOSIS — Z9989 Dependence on other enabling machines and devices: Secondary | ICD-10-CM

## 2015-01-20 DIAGNOSIS — I4891 Unspecified atrial fibrillation: Secondary | ICD-10-CM | POA: Diagnosis not present

## 2015-01-20 DIAGNOSIS — J811 Chronic pulmonary edema: Secondary | ICD-10-CM | POA: Insufficient documentation

## 2015-01-20 DIAGNOSIS — J9811 Atelectasis: Secondary | ICD-10-CM | POA: Diagnosis present

## 2015-01-20 LAB — CBC WITH DIFFERENTIAL/PLATELET
BASOS ABS: 0 10*3/uL (ref 0.0–0.1)
BASOS PCT: 0 % (ref 0–1)
Eosinophils Absolute: 0.1 10*3/uL (ref 0.0–0.7)
Eosinophils Relative: 1 % (ref 0–5)
HEMATOCRIT: 33.2 % — AB (ref 36.0–46.0)
Hemoglobin: 10.1 g/dL — ABNORMAL LOW (ref 12.0–15.0)
LYMPHS PCT: 8 % — AB (ref 12–46)
Lymphs Abs: 0.6 10*3/uL — ABNORMAL LOW (ref 0.7–4.0)
MCH: 27 pg (ref 26.0–34.0)
MCHC: 30.4 g/dL (ref 30.0–36.0)
MCV: 88.8 fL (ref 78.0–100.0)
Monocytes Absolute: 0.6 10*3/uL (ref 0.1–1.0)
Monocytes Relative: 8 % (ref 3–12)
NEUTROS ABS: 6.2 10*3/uL (ref 1.7–7.7)
Neutrophils Relative %: 83 % — ABNORMAL HIGH (ref 43–77)
PLATELETS: 180 10*3/uL (ref 150–400)
RBC: 3.74 MIL/uL — AB (ref 3.87–5.11)
RDW: 16.7 % — AB (ref 11.5–15.5)
WBC: 7.4 10*3/uL (ref 4.0–10.5)

## 2015-01-20 LAB — COMPREHENSIVE METABOLIC PANEL
ALT: 14 U/L (ref 14–54)
AST: 16 U/L (ref 15–41)
Albumin: 3 g/dL — ABNORMAL LOW (ref 3.5–5.0)
Alkaline Phosphatase: 84 U/L (ref 38–126)
Anion gap: 10 (ref 5–15)
BILIRUBIN TOTAL: 0.6 mg/dL (ref 0.3–1.2)
BUN: 38 mg/dL — ABNORMAL HIGH (ref 6–20)
CALCIUM: 8.8 mg/dL — AB (ref 8.9–10.3)
CO2: 33 mmol/L — ABNORMAL HIGH (ref 22–32)
Chloride: 98 mmol/L — ABNORMAL LOW (ref 101–111)
Creatinine, Ser: 1.66 mg/dL — ABNORMAL HIGH (ref 0.44–1.00)
GFR calc Af Amer: 36 mL/min — ABNORMAL LOW (ref >60)
GFR calc non Af Amer: 31 mL/min — ABNORMAL LOW (ref >60)
GLUCOSE: 122 mg/dL — AB (ref 65–99)
POTASSIUM: 3.7 mmol/L (ref 3.5–5.1)
SODIUM: 141 mmol/L (ref 135–145)
Total Protein: 6.3 g/dL — ABNORMAL LOW (ref 6.5–8.1)

## 2015-01-20 LAB — BRAIN NATRIURETIC PEPTIDE: B Natriuretic Peptide: 330.4 pg/mL — ABNORMAL HIGH (ref 0.0–100.0)

## 2015-01-20 LAB — MAGNESIUM: MAGNESIUM: 1.9 mg/dL (ref 1.7–2.4)

## 2015-01-20 LAB — MRSA PCR SCREENING: MRSA BY PCR: NEGATIVE

## 2015-01-20 MED ORDER — LEVOTHYROXINE SODIUM 50 MCG PO TABS
50.0000 ug | ORAL_TABLET | Freq: Every day | ORAL | Status: DC
  Administered 2015-01-21 – 2015-01-26 (×6): 50 ug via ORAL
  Filled 2015-01-20 (×7): qty 1

## 2015-01-20 MED ORDER — BISOPROLOL FUMARATE 10 MG PO TABS
10.0000 mg | ORAL_TABLET | Freq: Every day | ORAL | Status: DC
  Administered 2015-01-21 – 2015-01-26 (×6): 10 mg via ORAL
  Filled 2015-01-20 (×6): qty 1

## 2015-01-20 MED ORDER — DOXYCYCLINE HYCLATE 100 MG PO TABS
100.0000 mg | ORAL_TABLET | Freq: Two times a day (BID) | ORAL | Status: DC
Start: 2015-01-20 — End: 2015-01-21
  Administered 2015-01-20: 100 mg via ORAL
  Filled 2015-01-20 (×3): qty 1

## 2015-01-20 MED ORDER — MONTELUKAST SODIUM 10 MG PO TABS
10.0000 mg | ORAL_TABLET | Freq: Every day | ORAL | Status: DC
  Administered 2015-01-20 – 2015-01-21 (×2): 10 mg via ORAL
  Filled 2015-01-20 (×3): qty 1

## 2015-01-20 MED ORDER — FEBUXOSTAT 40 MG PO TABS
80.0000 mg | ORAL_TABLET | Freq: Every day | ORAL | Status: DC
  Administered 2015-01-21 – 2015-01-26 (×6): 80 mg via ORAL
  Filled 2015-01-20 (×6): qty 2

## 2015-01-20 MED ORDER — MACITENTAN 10 MG PO TABS
10.0000 mg | ORAL_TABLET | Freq: Every day | ORAL | Status: DC
  Filled 2015-01-20: qty 1

## 2015-01-20 MED ORDER — APIXABAN 5 MG PO TABS
5.0000 mg | ORAL_TABLET | Freq: Two times a day (BID) | ORAL | Status: DC
  Administered 2015-01-20 – 2015-01-24 (×8): 5 mg via ORAL
  Filled 2015-01-20 (×9): qty 1

## 2015-01-20 MED ORDER — SODIUM CHLORIDE 0.9 % IV SOLN
250.0000 mL | INTRAVENOUS | Status: DC | PRN
  Administered 2015-01-22 – 2015-01-23 (×2): 250 mL via INTRAVENOUS

## 2015-01-20 MED ORDER — IPRATROPIUM-ALBUTEROL 0.5-2.5 (3) MG/3ML IN SOLN
3.0000 mL | RESPIRATORY_TRACT | Status: DC
  Administered 2015-01-20 – 2015-01-21 (×3): 3 mL via RESPIRATORY_TRACT
  Filled 2015-01-20 (×4): qty 3

## 2015-01-20 MED ORDER — FUROSEMIDE 10 MG/ML IJ SOLN
80.0000 mg | Freq: Two times a day (BID) | INTRAMUSCULAR | Status: DC
Start: 2015-01-20 — End: 2015-01-20
  Administered 2015-01-20: 80 mg via INTRAVENOUS
  Filled 2015-01-20: qty 8

## 2015-01-20 MED ORDER — MOMETASONE FURO-FORMOTEROL FUM 100-5 MCG/ACT IN AERO
2.0000 | INHALATION_SPRAY | Freq: Two times a day (BID) | RESPIRATORY_TRACT | Status: DC
  Administered 2015-01-20 – 2015-01-21 (×2): 2 via RESPIRATORY_TRACT
  Filled 2015-01-20: qty 8.8

## 2015-01-20 MED ORDER — PRAVASTATIN SODIUM 20 MG PO TABS
20.0000 mg | ORAL_TABLET | Freq: Every evening | ORAL | Status: DC
  Administered 2015-01-20 – 2015-01-25 (×6): 20 mg via ORAL
  Filled 2015-01-20 (×7): qty 1

## 2015-01-20 MED ORDER — CEFTRIAXONE SODIUM IN DEXTROSE 20 MG/ML IV SOLN
1.0000 g | INTRAVENOUS | Status: DC
  Administered 2015-01-20 – 2015-01-21 (×2): 1 g via INTRAVENOUS
  Filled 2015-01-20 (×3): qty 50

## 2015-01-20 MED ORDER — ONDANSETRON HCL 4 MG/2ML IJ SOLN: 4.0000 mg | Freq: Four times a day (QID) | INTRAMUSCULAR | Status: DC | PRN

## 2015-01-20 MED ORDER — SERTRALINE HCL 50 MG PO TABS
50.0000 mg | ORAL_TABLET | Freq: Every day | ORAL | Status: DC
  Administered 2015-01-20 – 2015-01-26 (×7): 50 mg via ORAL
  Filled 2015-01-20 (×7): qty 1

## 2015-01-20 MED ORDER — MELATONIN 5 MG PO CAPS: 1.0000 | ORAL_CAPSULE | Freq: Every evening | ORAL | Status: DC | PRN

## 2015-01-20 MED ORDER — TRAMADOL HCL 50 MG PO TABS: 100.0000 mg | ORAL_TABLET | Freq: Four times a day (QID) | ORAL | Status: DC | PRN

## 2015-01-20 MED ORDER — SODIUM CHLORIDE 0.9 % IJ SOLN: 3.0000 mL | INTRAMUSCULAR | Status: DC | PRN

## 2015-01-20 MED ORDER — POTASSIUM CHLORIDE CRYS ER 20 MEQ PO TBCR
40.0000 meq | EXTENDED_RELEASE_TABLET | Freq: Two times a day (BID) | ORAL | Status: DC
  Administered 2015-01-20 – 2015-01-26 (×12): 40 meq via ORAL
  Filled 2015-01-20 (×15): qty 2

## 2015-01-20 MED ORDER — PREDNISONE 20 MG PO TABS
40.0000 mg | ORAL_TABLET | Freq: Every day | ORAL | Status: DC
  Administered 2015-01-20 – 2015-01-22 (×3): 40 mg via ORAL
  Filled 2015-01-20 (×4): qty 2

## 2015-01-20 MED ORDER — ACETAMINOPHEN 500 MG PO TABS: 1000.0000 mg | ORAL_TABLET | Freq: Every day | ORAL | Status: DC | PRN

## 2015-01-20 MED ORDER — TADALAFIL (PAH) 20 MG PO TABS
20.0000 mg | ORAL_TABLET | Freq: Every day | ORAL | Status: DC
  Administered 2015-01-21 – 2015-01-26 (×6): 20 mg via ORAL
  Filled 2015-01-20 (×7): qty 1

## 2015-01-20 MED ORDER — COLCHICINE 0.6 MG PO TABS
0.6000 mg | ORAL_TABLET | Freq: Every day | ORAL | Status: DC | PRN
  Filled 2015-01-20: qty 1

## 2015-01-20 MED ORDER — INSULIN ASPART 100 UNIT/ML ~~LOC~~ SOLN
0.0000 [IU] | Freq: Every day | SUBCUTANEOUS | Status: DC
  Administered 2015-01-25: 2 [IU] via SUBCUTANEOUS

## 2015-01-20 MED ORDER — FUROSEMIDE 10 MG/ML IJ SOLN
80.0000 mg | Freq: Three times a day (TID) | INTRAMUSCULAR | Status: DC
  Administered 2015-01-21 – 2015-01-24 (×10): 80 mg via INTRAVENOUS
  Filled 2015-01-20 (×15): qty 8

## 2015-01-20 MED ORDER — SODIUM CHLORIDE 0.9 % IJ SOLN
3.0000 mL | Freq: Two times a day (BID) | INTRAMUSCULAR | Status: DC
  Administered 2015-01-20 – 2015-01-21 (×3): 3 mL via INTRAVENOUS
  Administered 2015-01-22: 10:00:00 via INTRAVENOUS
  Administered 2015-01-22 – 2015-01-25 (×5): 3 mL via INTRAVENOUS

## 2015-01-20 MED ORDER — MAGNESIUM OXIDE 400 (241.3 MG) MG PO TABS
400.0000 mg | ORAL_TABLET | Freq: Every day | ORAL | Status: DC
  Administered 2015-01-21 – 2015-01-26 (×6): 400 mg via ORAL
  Filled 2015-01-20 (×6): qty 1

## 2015-01-20 MED ORDER — ZOLPIDEM TARTRATE 5 MG PO TABS
5.0000 mg | ORAL_TABLET | Freq: Every day | ORAL | Status: DC | PRN
  Administered 2015-01-20 – 2015-01-25 (×6): 5 mg via ORAL
  Filled 2015-01-20 (×6): qty 1

## 2015-01-20 MED ORDER — LEFLUNOMIDE 20 MG PO TABS
20.0000 mg | ORAL_TABLET | Freq: Every day | ORAL | Status: DC
  Administered 2015-01-21 – 2015-01-26 (×6): 20 mg via ORAL
  Filled 2015-01-20 (×6): qty 1

## 2015-01-20 MED ORDER — INSULIN ASPART 100 UNIT/ML ~~LOC~~ SOLN
0.0000 [IU] | Freq: Three times a day (TID) | SUBCUTANEOUS | Status: DC
  Administered 2015-01-21: 3 [IU] via SUBCUTANEOUS
  Administered 2015-01-21: 18:00:00 via SUBCUTANEOUS
  Administered 2015-01-21 – 2015-01-22 (×2): 3 [IU] via SUBCUTANEOUS
  Administered 2015-01-22 – 2015-01-23 (×3): 2 [IU] via SUBCUTANEOUS
  Administered 2015-01-23: 3 [IU] via SUBCUTANEOUS
  Administered 2015-01-24 (×2): 2 [IU] via SUBCUTANEOUS
  Administered 2015-01-25: 3 [IU] via SUBCUTANEOUS

## 2015-01-20 MED ORDER — ACETAMINOPHEN 325 MG PO TABS: 650.0000 mg | ORAL_TABLET | ORAL | Status: DC | PRN

## 2015-01-20 MED ORDER — CETYLPYRIDINIUM CHLORIDE 0.05 % MT LIQD
7.0000 mL | Freq: Two times a day (BID) | OROMUCOSAL | Status: DC
  Administered 2015-01-20 – 2015-01-26 (×12): 7 mL via OROMUCOSAL

## 2015-01-20 MED ORDER — GABAPENTIN 300 MG PO CAPS
300.0000 mg | ORAL_CAPSULE | Freq: Two times a day (BID) | ORAL | Status: DC
  Administered 2015-01-20 – 2015-01-26 (×12): 300 mg via ORAL
  Filled 2015-01-20 (×13): qty 1

## 2015-01-20 NOTE — Progress Notes (Signed)
Patient ID: Tracey Duncan, female   DOB: 13-Nov-1945, 69 y.o.   MRN: [id number] PCP: Asa Lente  69 yo with history of COPD on home oxygen, OHS/OSA, rheumatoid arthritis, and chronic atrial fibrillation presents for cardiology followup.  She was diagnosed with atrial fibrillation around 2004. She failed cardioversion and it has been chronic.  She has noted exertional dyspnea since at least 2012.  She was diagnosed with COPD back in 2002.  She no longer smokes.  She had been on CPAP for OSA prior, but was started on home oxygen all the time by Dr. Lamonte Sakai.  No tachypalpitations, no chest pain.  Echo in 2/14 showed a moderately dilated RV with moderately decreased systolic function, PA systolic pressure 65 mmHg. She was started on apixaban.   V/Q scan was negative for chronic PE.  She has been diagnosed with rheumatoid arthritis and is on [name].  I did a right heart cath in 11/14 that confirmed moderate to severe PAH with PVR 7.4 WU.  In 12/14, I started her on Tyvaso.Repeat RHC in 6/15.  This was nearly identical to the prior study in 11/14. Given apparent minimal response to Tyvaso, I had her try coming off the medication.  Off Tyvaso, she felt that her breathing was definitely better.  She is now Opsumit, which she feels has helped.   Most recently, Adcirca was started.   She return for HF and pulmonary HTN follow up. Last visit diuretics increased again. Weight down 4 lbs but she does not feel any better. Short of breath with any exertion.  Comfortable at rest.  +Orthopnea.  Oxygen saturation today initially 78% on 6L [place], 83% after resting. She has been coughing with green sputum.  No subjective fever.        6 minute walk 11/15 122 m 6 minute walk 3/16 158 m  Labs (4/14): RF < 10, ANA negative, BNP 294, K 3.8, creatinine 1.7  Labs (9/14): K 4.5, creatinine 1.74 Labs (11/14): K 4.5=>3.9, creatinine 1.6, BNP 300 Labs (2/15): K 4.3, creatinine 1.6=>1.5, BNP 383=>311, LDL 72, HDL 49 Labs (3/15): K 4.2,  creatinine 1.5 Labs (5/15): K 4.6, creatinine 1.5 Labs (6/15): HCT 32.1, TSH normal, HCT 32.1 Labs (11/15): K 4.6, creatinine 1.57 Labs (12/15): TSH normal Labs (3/16): K 4.4, creatinine 1.67, BNP 294, HCT 37.3 Labs (4/16): K 4.1, creatinine 1.6, BNP 249 Labs (12/26/2014): K 5.2 Creatinine 1.97  Labs (5/16): K 4.5, creatinine 1.81  PMH: 1. OHS:   Sleep study (11/15) did not show significant OSA.  2. COPD: Diagnosed 2002.  FEV1 65% predicted in 4/14.  3. HTN 4. Chronic atrial fibrillation: Failed DCCV in 12/04.   5. Pulmonary HTN/cor pulmonale: Suspect this partly due to COPD and OHS/OSA (group 3) but cannot rule out group 1 component (has RA).  Echo (2/14) with EF 55-60%, moderately dilated RV with moderately decreased systolic function, PA systolic pressure 65 mmHg.  V/Q scan in 3/14 was negative for chronic PE. PFTs (4/14): FEV1 65% predicted, mild obstructive defect with severely decreased DLCO.  RHC (11/14): mean RA 8, PA 68/28 mean 45, mean PCWP 11, CI 2.29, PVR 7.2 WU. 6 minute walk (2/15) 126 meters.  6 minute walk (3/15) 118 m.  She started Tyvaso.  Repeat RHC in 6/15 on Tyvaso with mean RA 7, PA 65/28 mean 43, mean PCWP 16, PVR 6.2 WU, CI 2.23.  Given minimal change, Tyvaso was stopped and patient actually felt better. Echo (4/16) with EF 55-60%, severe biatrial enlargement, moderate  RV dilation with mildly decreased systolic function, PA systolic pressure 73 mmHg.  6. Lexiscan Thallium in 11/11 with no evidence for ischemia or infarction.  7. Impaired fasting glucose. 8. Hypothyroidism 9. Obesity. 10. CKD 11. Rheumatoid arthritis 12. ABIs (11/14): Normal 13. Hyperlipidemia: Myalgias with Crestor.  14. Depression 15. Chronic diastolic CHF 16. Gout  SH: Widow, moved to [place] from [place] to live with sister.  Quit smoking in 1993.  Rare ETOH. Retired Optometrist.   FH: Brother with MI at 61, father with MI at 77.  ROS: All systems reviewed and negative except as per HPI.    Current Outpatient Prescriptions  Medication Sig Dispense Refill  . acetaminophen (TYLENOL) 500 MG tablet Take 1,000 mg by mouth daily as needed (for arthritic pain).    . ADVAIR DISKUS 250-50 MCG/DOSE AEPB INHALE 1 PUFF INTO THE LUNGS EVERY 12 (TWELVE) HOURS. 60 each 5  . albuterol (PROAIR HFA) 108 (90 BASE) MCG/ACT inhaler Inhale 2 puffs into the lungs every 4 (four) hours as needed for wheezing or shortness of breath.     . Benzocaine (ANBESOL MT) Use as directed 1 application in the mouth or throat 2 (two) times daily as needed (for dry mouth).    . bisoprolol (ZEBETA) 10 MG tablet TAKE 1 TABLET EVERY DAY 30 tablet 6  . calcium carbonate (TUMS - DOSED IN MG ELEMENTAL CALCIUM) 500 MG chewable tablet Chew 2 tablets by mouth daily as needed for indigestion or heartburn.     . calcium-vitamin D (OSCAL WITH D) 500-200 MG-UNIT per tablet Take 1 tablet by mouth daily.    . cetirizine (ZYRTEC) 10 MG tablet Take 10 mg by mouth at bedtime.     . cholecalciferol (VITAMIN D) 1000 UNITS tablet Take 1,000 Units by mouth daily.    . Colchicine 0.6 MG CAPS Take 1 capsule by mouth daily as needed.     Marland Kitchen ELIQUIS 5 MG TABS tablet TAKE 1 TABLET BY MOUTH TWICE A DAY 60 tablet 0  . febuxostat (ULORIC) 40 MG tablet Take 80 mg by mouth daily.     Marland Kitchen gabapentin (NEURONTIN) 300 MG capsule Take 300 mg by mouth 2 (two) times daily.    Marland Kitchen ipratropium-albuterol (DUONEB) 0.5-2.5 (3) MG/3ML SOLN Take 3 mLs by nebulization every 4 (four) hours as needed.     . leflunomide (ARAVA) 20 MG tablet Take 20 mg by mouth daily.    Marland Kitchen levothyroxine (SYNTHROID, LEVOTHROID) 50 MCG tablet Take 1 tablet (50 mcg total) by mouth daily before breakfast. 90 tablet 3  . Macitentan (OPSUMIT) 10 MG TABS Take 10 mg by mouth daily. 30 tablet 6  . Magnesium 400 MG CAPS Take 1 capsule by mouth daily.    . Melatonin 5 MG CAPS Take 1 capsule by mouth at bedtime as needed.     . metFORMIN (GLUCOPHAGE) 500 MG tablet Take 1 tablet (500 mg total) by  mouth 2 (two) times daily with a meal. 180 tablet 3  . metolazone (ZAROXOLYN) 2.5 MG tablet Take 1 tablet (2.5 mg total) by mouth once a week. WEDNESDAYS. 15 tablet 3  . montelukast (SINGULAIR) 10 MG tablet Take 10 mg by mouth at bedtime.    . Multiple Vitamins-Minerals (CENTRUM SILVER PO) Take 1 tablet by mouth daily.    . Polyvinyl Alcohol-Povidone (REFRESH OP) Place 1 drop into both eyes daily as needed (for dry eyes).    . potassium chloride SA (K-DUR,KLOR-CON) 20 MEQ tablet Take 40 mEq by mouth 2 (two)  times daily.    . pravastatin (PRAVACHOL) 20 MG tablet TAKE 1 TABLET BY MOUTH EVERY EVENING 30 tablet 6  . prednisoLONE 5 MG TABS tablet Take 5 mg by mouth daily.    Marland Kitchen PRESCRIPTION MEDICATION Oxygen.   Pt uses 6-8 liters of oxygen daily.    . sertraline (ZOLOFT) 50 MG tablet TAKE 1 TABLET BY MOUTH EVERY DAY 30 tablet 8  . SPIRIVA HANDIHALER 18 MCG inhalation capsule INHALE 1 CAPSULE VIA HANDIHALER ONCE DAILY AT THE SAME TIME EVERY DAY 1 capsule 3  . Tadalafil, PAH, (ADCIRCA) 20 MG TABS Take 1 tablet (20 mg total) by mouth daily. 60 tablet 6  . torsemide (DEMADEX) 20 MG tablet Take 83m (4 tablets) in am and 431m(2 tablets) in pm. 180 tablet 3  . traMADol (ULTRAM) 50 MG tablet Take 100 mg by mouth as needed for moderate pain.     . Marland Kitchenolpidem (AMBIEN) 5 MG tablet Take 5 mg by mouth daily as needed for sleep.     No current facility-administered medications for this encounter.    BP 112/78 mmHg  Pulse 118  Wt 201 lb 1.9 oz (91.227 kg)  SpO2 77% General: NAD, obese.  Neck: JVP 12-14 cm, no thyromegaly or thyroid nodule.  Lungs: Distant breath sounds bilaterally. Crackles left base.  CV: Nondisplaced PMI.  Heart irregular S1/S2, no S3/S4, no murmur. 1+ edema to knees bilaterally.  No carotid bruit.   Abdomen: Soft, nontender, no hepatosplenomegaly, no distention.  Skin: Intact without lesions or rashes.  Neurologic: Alert and oriented x 3.  Psych: Normal affect. Extremities: No clubbing  or cyanosis.    Assessment/Plan: 1. Atrial fibrillation: Chronic x years.  She would be unlikely to remain in NSR if cardioversion were attempted.  Plan rate control.  CHADSVASC = 3 at least.  No history of GI bleeding.  Continue apixaban.  Reasonable rate control with bisoprolol.   2. Pulmonary arterial HTN/cor pulmonale: Significant right heart failure.  Echo in 4/16 showed moderately dilated and mildly dysfunctional RV and PA systolic pressure 73 mmHg mmHg.  PA systolic pressure 6582/95n RHC in 6/15 with normal PCWP.  She had minimal improvement with Tyvaso (nearly identical RHC on and off Tyvaso) and actually felt considerably better off Tyvaso.  Patient has a group 3 component to her PAH (OHS/OSA and COPD), but suspect a group 1 contribution that could be related to rheumatological disease (rheumatoid arthritis).  She has had some improvement with Opsumit and recently started Adcirca.  She remains volume overloaded with prominent signs of RV failure, NYHA class IV symptoms.  - Continue oxygen supplementation.  - Continue Opsumit 10 daily and Adcirca 20 daily.   - She is going to need admission for IV diuresis.  Will start Lasix 80 mg IV bid, 1st dose now.  When more diuresed, will likely repeat RHC.  3. CKD: BMET today.     4. RA: Being treated (Dr. BeAmil Amen  Continues to be limited by joint pain. 5. CHF: Primarily RV failure, as above.   6. OHS/OSA: Actually had sleep study without significant OSA so is now off CPAP.  7. COPD: On home oxygen.  I wonder if she does not have a component of bronchitis or PNA causing AECOPD and worsening her clinical picture.  - CXR.  - Will empirically treat for CAP with ceftriaxone/doxycycline - Increase prednisone to 40 mg daily.  - Will ask pulmonary to evaluate.  8. Depression: Improved mood with sertraline.  [name] 01/20/2015

## 2015-01-20 NOTE — Progress Notes (Signed)
Patient's Oxygen Saturation levels are fluctuating between 70's to 90's. Patient is asymptomatic and states she feels fine and is not having any issues conversating with RN in full sentences.  The pulse ox waveform is variable/not appropriate and appears to not be providing an accurate reading.  Had respiratory therapy assist in trying to determine an accurate pulse ox reading, tried the pulse ox on the patient's finger, ear and forehead with no success.  Hesitant to increase patient's oxygen if pulse ox reading is inaccurate. Spoke with MD Philbert Riser, MD advised to place patient on normal home oxygen amount of 7 L nasal cannula and to have respiratory therapy draw an ABG after patient has been on this level of oxygen for approximately an hour. Vicie Mutters, RN

## 2015-01-20 NOTE — Progress Notes (Signed)
PHARMACIST - PHYSICIAN ORDER COMMUNICATION  CONCERNING: P&T Medication Policy on Herbal Medications  DESCRIPTION:  This patient's order for:  Melatonin  has been noted.  This product(s) is classified as an "herbal" or natural product. Due to a lack of definitive safety studies or FDA approval, nonstandard manufacturing practices, plus the potential risk of unknown drug-drug interactions while on inpatient medications, the Pharmacy and Therapeutics Committee does not permit the use of "herbal" or natural products of this type within [facility].   ACTION TAKEN: The pharmacy department is unable to verify this order at this time and your patient has been informed of this safety policy. Please reevaluate patient's clinical condition at discharge and address if the herbal or natural product(s) should be resumed at that time.

## 2015-01-21 ENCOUNTER — Encounter (HOSPITAL_COMMUNITY): Payer: Self-pay | Admitting: *Deleted

## 2015-01-21 DIAGNOSIS — R0902 Hypoxemia: Secondary | ICD-10-CM

## 2015-01-21 DIAGNOSIS — I270 Primary pulmonary hypertension: Secondary | ICD-10-CM

## 2015-01-21 DIAGNOSIS — J449 Chronic obstructive pulmonary disease, unspecified: Secondary | ICD-10-CM

## 2015-01-21 DIAGNOSIS — J441 Chronic obstructive pulmonary disease with (acute) exacerbation: Secondary | ICD-10-CM

## 2015-01-21 DIAGNOSIS — N183 Chronic kidney disease, stage 3 (moderate): Secondary | ICD-10-CM

## 2015-01-21 DIAGNOSIS — J189 Pneumonia, unspecified organism: Secondary | ICD-10-CM

## 2015-01-21 LAB — BASIC METABOLIC PANEL
Anion gap: 9 (ref 5–15)
BUN: 40 mg/dL — AB (ref 6–20)
CALCIUM: 8.5 mg/dL — AB (ref 8.9–10.3)
CO2: 32 mmol/L (ref 22–32)
Chloride: 100 mmol/L — ABNORMAL LOW (ref 101–111)
Creatinine, Ser: 1.71 mg/dL — ABNORMAL HIGH (ref 0.44–1.00)
GFR calc Af Amer: 35 mL/min — ABNORMAL LOW (ref >60)
GFR calc non Af Amer: 30 mL/min — ABNORMAL LOW (ref >60)
GLUCOSE: 140 mg/dL — AB (ref 65–99)
Potassium: 4.2 mmol/L (ref 3.5–5.1)
Sodium: 141 mmol/L (ref 135–145)

## 2015-01-21 LAB — GLUCOSE, CAPILLARY
GLUCOSE-CAPILLARY: 195 mg/dL — AB (ref 65–99)
GLUCOSE-CAPILLARY: 164 mg/dL — AB (ref 65–99)
GLUCOSE-CAPILLARY: 200 mg/dL — AB (ref 65–99)
Glucose-Capillary: 135 mg/dL — ABNORMAL HIGH (ref 65–99)
Glucose-Capillary: 155 mg/dL — ABNORMAL HIGH (ref 65–99)

## 2015-01-21 LAB — POCT I-STAT 3, ART BLOOD GAS (G3+)
Acid-Base Excess: 6 mmol/L — ABNORMAL HIGH (ref 0.0–2.0)
Bicarbonate: 31.5 mEq/L — ABNORMAL HIGH (ref 20.0–24.0)
O2 Saturation: 85 %
TCO2: 33 mmol/L (ref 0–100)
pCO2 arterial: 48.1 mmHg — ABNORMAL HIGH (ref 35.0–45.0)
pH, Arterial: 7.424 (ref 7.350–7.450)
pO2, Arterial: 49 mmHg — ABNORMAL LOW (ref 80.0–100.0)

## 2015-01-21 LAB — STREP PNEUMONIAE URINARY ANTIGEN: Strep Pneumo Urinary Antigen: NEGATIVE

## 2015-01-21 MED ORDER — ARFORMOTEROL TARTRATE 15 MCG/2ML IN NEBU
15.0000 ug | INHALATION_SOLUTION | Freq: Two times a day (BID) | RESPIRATORY_TRACT | Status: DC
  Administered 2015-01-21 – 2015-01-25 (×9): 15 ug via RESPIRATORY_TRACT
  Filled 2015-01-21 (×13): qty 2

## 2015-01-21 MED ORDER — IPRATROPIUM-ALBUTEROL 0.5-2.5 (3) MG/3ML IN SOLN
3.0000 mL | Freq: Four times a day (QID) | RESPIRATORY_TRACT | Status: DC
  Administered 2015-01-21 – 2015-01-22 (×4): 3 mL via RESPIRATORY_TRACT
  Filled 2015-01-21 (×4): qty 3

## 2015-01-21 MED ORDER — DEXTROSE 5 % IV SOLN
500.0000 mg | INTRAVENOUS | Status: DC
  Administered 2015-01-21: 500 mg via INTRAVENOUS
  Filled 2015-01-21 (×3): qty 500

## 2015-01-21 MED ORDER — MACITENTAN 10 MG PO TABS
10.0000 mg | ORAL_TABLET | Freq: Every day | ORAL | Status: DC
  Administered 2015-01-21 – 2015-01-26 (×6): 10 mg via ORAL
  Filled 2015-01-21 (×2): qty 1

## 2015-01-21 MED ORDER — BUDESONIDE 0.25 MG/2ML IN SUSP
0.5000 mg | Freq: Two times a day (BID) | RESPIRATORY_TRACT | Status: DC
  Filled 2015-01-21 (×3): qty 4

## 2015-01-21 MED ORDER — BUDESONIDE 0.5 MG/2ML IN SUSP
0.5000 mg | Freq: Two times a day (BID) | RESPIRATORY_TRACT | Status: DC
  Administered 2015-01-21 – 2015-01-25 (×9): 0.5 mg via RESPIRATORY_TRACT
  Filled 2015-01-21 (×13): qty 2

## 2015-01-21 MED ORDER — CALCIUM CARBONATE ANTACID 500 MG PO CHEW
1.0000 | CHEWABLE_TABLET | Freq: Four times a day (QID) | ORAL | Status: DC | PRN
  Filled 2015-01-21: qty 1

## 2015-01-21 NOTE — Care Management Note (Signed)
Case Management Note  Patient Details  Name: Tracey Duncan MRN: [id number] Date of Birth: 28-Mar-1946  Subjective/Objective:       Adm w heart failure             Action/Plan:  Lives alone, pcp dr Gwendolyn Grant   Expected Discharge Date:                  Expected Discharge Plan:  [place]  In-House Referral:     Discharge planning Services     Post Acute Care Choice:    Choice offered to:     DME Arranged:    DME Agency:     HH Arranged:    [place]:     Status of Service:     Medicare Important Message Given:    Date Medicare IM Given:    Medicare IM give by:    Date Additional Medicare IM Given:    Additional Medicare Important Message give by:     If discussed at [place] of Stay Meetings, dates discussed:    Additional Comments: ur ins review  Lacretia Leigh, RN 01/21/2015, 8:26 AM

## 2015-01-21 NOTE — Progress Notes (Signed)
Changed pt to [place] due to stable sats

## 2015-01-21 NOTE — Progress Notes (Addendum)
Patient ID: Tracey Duncan, female   DOB: [birth year], 69 y.o.   MRN: [id number]   SUBJECTIVE: Says she feels better but oxygen saturations have been in the 80s since she has been here.   Scheduled Meds: . antiseptic oral rinse  7 mL Mouth Rinse BID  . apixaban  5 mg Oral BID  . bisoprolol  10 mg Oral Daily  . cefTRIAXone (ROCEPHIN)  IV  1 g Intravenous Q24H  . doxycycline  100 mg Oral Q12H  . febuxostat  80 mg Oral Daily  . furosemide  80 mg Intravenous 3 times per day  . gabapentin  300 mg Oral BID  . insulin aspart  0-15 Units Subcutaneous TID WC  . insulin aspart  0-5 Units Subcutaneous QHS  . ipratropium-albuterol  3 mL Nebulization Q4H  . leflunomide  20 mg Oral Daily  . levothyroxine  50 mcg Oral QAC breakfast  . Macitentan  10 mg Oral Daily  . magnesium oxide  400 mg Oral Daily  . mometasone-formoterol  2 puff Inhalation BID  . montelukast  10 mg Oral QHS  . potassium chloride SA  40 mEq Oral BID  . pravastatin  20 mg Oral QPM  . predniSONE  40 mg Oral Q breakfast  . sertraline  50 mg Oral Daily  . sodium chloride  3 mL Intravenous Q12H  . Tadalafil (PAH)  20 mg Oral Daily   Continuous Infusions:  PRN Meds:.sodium chloride, acetaminophen, colchicine, ondansetron (ZOFRAN) IV, sodium chloride, traMADol, zolpidem    Filed Vitals:   01/21/15 0500 01/21/15 0517 01/21/15 0600 01/21/15 0700  BP:  120/75 103/56 97/65  Pulse:  116 97 107  Temp:    98.5 F (36.9 C)  TempSrc:    Oral  Resp:  _0 Height: _1  (1.626 m)     Weight: 197 lb 15.6 oz (89.8 kg)     SpO2:  85% 84% 82%    Intake/Output Summary (Last 24 hours) at 01/21/15 0827 Last data filed at 01/21/15 0500  Gross per 24 hour  Intake    530 ml  Output   1200 ml  Net   -670 ml    LABS: Basic Metabolic Panel:  Recent Labs  01/20/15 1840 01/21/15 0258  NA 141 141  K 3.7 4.2  CL 98* 100*  CO2 33* 32  GLUCOSE 122* 140*  BUN 38* 40*  CREATININE 1.66* 1.71*  CALCIUM 8.8* 8.5*  MG 1.9  --     Liver Function Tests:  Recent Labs  01/20/15 1840  AST 16  ALT 14  ALKPHOS 84  BILITOT 0.6  PROT 6.3*  ALBUMIN 3.0*   No results for input(s): LIPASE, AMYLASE in the last 72 hours. CBC:  Recent Labs  01/20/15 1840  WBC 7.4  NEUTROABS 6.2  HGB 10.1*  HCT 33.2*  MCV 88.8  PLT 180   Cardiac Enzymes: No results for input(s): CKTOTAL, CKMB, CKMBINDEX, TROPONINI in the last 72 hours. BNP: Invalid input(s): POCBNP D-Dimer: No results for input(s): DDIMER in the last 72 hours. Hemoglobin A1C: No results for input(s): HGBA1C in the last 72 hours. Fasting Lipid Panel: No results for input(s): CHOL, HDL, LDLCALC, TRIG, CHOLHDL, LDLDIRECT in the last 72 hours. Thyroid Function Tests: No results for input(s): TSH, T4TOTAL, T3FREE, THYROIDAB in the last 72 hours.  Invalid input(s): FREET3 Anemia Panel: No results for input(s): VITAMINB12, FOLATE, FERRITIN, TIBC, IRON, RETICCTPCT in the last 72 hours.  RADIOLOGY: Dg Chest 2 View  01/20/2015   CLINICAL DATA:  severe sob, has been saturating anywhere from the 70's to the low 90"s, is on O2 in the home, has a mask on tonight, hx chf, htn, copd, is getting lasix as soon as she gets back upstairs  EXAM: CHEST - 2 VIEW  COMPARISON:  11/06/2014  FINDINGS: Increasing pleural effusions, moderate right and small left. Stable cardiomegaly. Worsening bilateral interstitial and airspace edema or infiltrates involving bases more than apices. New consolidation/ atelectasis in basilar segments of both lower lobes. No pneumothorax.  Atheromatous aorta. Visualized skeletal structures are unremarkable.  IMPRESSION: 1. Worsening bilateral edema/infiltrates and asymmetric pleural effusions. 2. Stable cardiomegaly   Electronically Signed   By: Lucrezia Europe M.D.   On: 01/20/2015 21:35    PHYSICAL EXAM General: NAD Neck: JVP 12 cm, no thyromegaly or thyroid nodule.  Lungs: Basilar crackles bilaterally CV: Nondisplaced PMI.  Heart mildly tachy,  irregular S1/S2, no S3/S4, no murmur.  1+ ankle edema.   Abdomen: Soft, nontender, no hepatosplenomegaly, no distention.  Neurologic: Alert and oriented x 3.  Psych: Normal affect. Extremities: No clubbing or cyanosis.   TELEMETRY: Reviewed telemetry pt in afib in 100s  ASSESSMENT AND PLAN: 1. Atrial fibrillation: Chronic x years. She would be unlikely to remain in NSR if cardioversion were attempted. Plan rate control. CHADSVASC = 3 at least. No history of GI bleeding. Continue apixaban and bisoprolol.  2. Pulmonary arterial HTN/cor pulmonale: Significant right heart failure. Echo in 4/16 showed moderately dilated and mildly dysfunctional RV and PA systolic pressure 73 mmHg mmHg. PA systolic pressure 50/35 on RHC in 6/15 with normal PCWP. She had minimal improvement with Tyvaso (nearly identical RHC on and off Tyvaso) and actually felt considerably better off Tyvaso. Patient has a group 3 component to her PAH (OHS/OSA and COPD), but suspect a group 1 contribution that could be related to rheumatological disease (rheumatoid arthritis). She has had some improvement with Opsumit and recently started Adcirca. She remains volume overloaded with prominent signs of RV failure, NYHA class IV symptoms.  - Continue oxygen supplementation.  - Continue Opsumit 10 daily and Adcirca 20 daily.  - Continue Lasix 80 mg IV every 8 hrs. When more diuresed, will likely repeat RHC.  3. CKD: Creatinine stable so far.  4. RA: Being treated (Dr. Amil Amen). Continues to be limited by joint pain. 5. CHF: Primarily RV failure, as above.  6. OHS/OSA: Actually had sleep study without significant OSA so is now off CPAP.  7. COPD: On home oxygen. I wonder if she does not have a component of bronchitis or PNA causing AECOPD and worsening her clinical picture.  CXR with edema, cannot rule out PNA. Persistently low oxygen saturation but she is comfortable.  - Will empirically treat for CAP with  ceftriaxone/doxycycline.  Sputum culture, urine strep and legionella.  - Increased prednisone to 40 mg daily.  - Will ask pulmonary to evaluate today.   [name] 01/21/2015 8:31 AM

## 2015-01-21 NOTE — Consult Note (Signed)
Name: Tracey Duncan MRN: [id number] DOB: 07-20-1946    ADMISSION DATE:  01/20/2015 CONSULTATION DATE:  5/18  REFERRING MD :  Aundra Dubin (cards)   CHIEF COMPLAINT:  Hypoxia   BRIEF PATIENT DESCRIPTION: 69yo female former smoker with hx RA, COPD, OSA, AFib , dCHF, pulmonary HTN on 6-8L home O2 maintaited on macetentin and adcirca (followed by Dr. Lamonte Sakai as outpt).  Presented 5/17 for outpt cardiology followup and was found to have O2 sats 78% on 6L, weight up from baseline, orthopnea, cough with green sputum.  She was directly admitted per cardiology for IV diuresis.  Symptoms improved somewhat with diuresis but she remains hypoxic with sats 88% on 50% vm and PCCM consulted to assist.   SIGNIFICANT EVENTS   STUDIES:  12/11/14 echo>> EF 55-60%, mild MR, mod dilated RV, Pa peak pressure 73 mmHg (previously 65 mmHg 2014) CXR 5/17>> pulmonary edema, bilat effusions, bibasilar consolidation   HISTORY OF PRESENT ILLNESS:  69yo female former smoker with hx RA, COPD, OSA, AFib , dCHF, pulmonary HTN on 6-8L home O2 maintaited on macetentin and [place].  Presented 5/17 for outpt cardiology followup and was found to have O2 sats 78% on 6L, weight up from baseline, orthopnea, cough with green sputum.  She was directly admitted per cardiology for IV diuresis.  Symptoms improved somewhat with diuresis but she remains hypoxic with sats 88% on 50% vm and PCCM consulted to assist. Does still c/o increased fatigue over last 2 weeks, decreased activity tolerance, slightly progressive SOB, cough with green sputum.  Denies chest pain, hemoptysis, fevers.   PAST MEDICAL HISTORY :   has a past medical history of Hypertension; Atrial fibrillation; OSA on CPAP; Asthma; Hyperlipidemia; COPD (chronic obstructive pulmonary disease); Chronic diastolic CHF (congestive heart failure); Cor pulmonale; Obesity hypoventilation syndrome; Diabetes type 2, controlled; Pulmonary arterial hypertension; Secondary PAH; Unspecified  hypothyroidism; and RLS (restless legs syndrome) (08/20/2014).  has past surgical history that includes Cardioversion; Tonsillectomy (1955); Abdominal hysterectomy (2004); right heart catherization (07/11/13 & 02/06/14); right heart catheterization (N/A, 07/11/2013); and right heart catheterization (N/A, 02/06/2014). Prior to Admission medications   Medication Sig Start Date End Date Taking? Authorizing Provider  ADVAIR DISKUS 250-50 MCG/DOSE AEPB INHALE 1 PUFF INTO THE LUNGS EVERY 12 (TWELVE) HOURS. 09/30/14  Yes Collene Gobble, MD  bisoprolol (ZEBETA) 10 MG tablet TAKE 1 TABLET EVERY DAY 09/02/14  Yes Larey Dresser, MD  calcium-vitamin D (OSCAL WITH D) 500-200 MG-UNIT per tablet Take 1 tablet by mouth daily.   Yes Historical Provider, MD  cetirizine (ZYRTEC) 10 MG tablet Take 10 mg by mouth at bedtime.    Yes Historical Provider, MD  cholecalciferol (VITAMIN D) 1000 UNITS tablet Take 1,000 Units by mouth daily.   Yes Historical Provider, MD  DENTAGEL 1.1 % GEL dental gel Place 1 application onto teeth at bedtime.  12/13/14  Yes Historical Provider, MD  ELIQUIS 5 MG TABS tablet TAKE 1 TABLET BY MOUTH TWICE A DAY 04/28/14  Yes Larey Dresser, MD  febuxostat (ULORIC) 40 MG tablet Take 80 mg by mouth daily.    Yes Historical Provider, MD  gabapentin (NEURONTIN) 300 MG capsule Take 300 mg by mouth 2 (two) times daily.   Yes Historical Provider, MD  ipratropium-albuterol (DUONEB) 0.5-2.5 (3) MG/3ML SOLN Take 3 mLs by nebulization every 4 (four) hours as needed (for wheezing or shortness of breath).    Yes Historical Provider, MD  leflunomide (ARAVA) 10 MG tablet Take 20 mg by mouth daily. 11/29/14  Yes Historical Provider, MD  levothyroxine (SYNTHROID, LEVOTHROID) 50 MCG tablet Take 1 tablet (50 mcg total) by mouth daily before breakfast. 12/22/14  Yes Rowe Clack, MD  Macitentan (OPSUMIT) 10 MG TABS Take 10 mg by mouth daily. 11/06/14  Yes Larey Dresser, MD  Magnesium 400 MG CAPS Take 1 capsule by mouth  daily.   Yes Historical Provider, MD  metFORMIN (GLUCOPHAGE) 500 MG tablet Take 1 tablet (500 mg total) by mouth 2 (two) times daily with a meal. 12/22/14  Yes Rowe Clack, MD  montelukast (SINGULAIR) 10 MG tablet Take 10 mg by mouth at bedtime.   Yes Historical Provider, MD  Multiple Vitamins-Minerals (CENTRUM SILVER PO) Take 1 tablet by mouth daily.   Yes Historical Provider, MD  OVER THE COUNTER MEDICATION Apply 1 application topically daily as needed (dry mouth).   Yes Historical Provider, MD  Polyvinyl Alcohol-Povidone (REFRESH OP) Place 1 drop into both eyes daily as needed (for dry eyes).   Yes Historical Provider, MD  potassium chloride SA (K-DUR,KLOR-CON) 20 MEQ tablet Take 40 mEq by mouth 2 (two) times daily.   Yes Historical Provider, MD  pravastatin (PRAVACHOL) 20 MG tablet TAKE 1 TABLET BY MOUTH EVERY EVENING 09/02/14  Yes Larey Dresser, MD  prednisoLONE 5 MG TABS tablet Take 5 mg by mouth daily.   Yes Historical Provider, MD  PRESCRIPTION MEDICATION Oxygen.   Pt uses 6-8 liters of oxygen daily.   Yes Historical Provider, MD  sertraline (ZOLOFT) 50 MG tablet TAKE 1 TABLET BY MOUTH EVERY DAY Patient taking differently: TAKE 1 TABLET BY MOUTH every evening 10/31/14  Yes Larey Dresser, MD  SPIRIVA HANDIHALER 18 MCG inhalation capsule INHALE 1 CAPSULE VIA HANDIHALER ONCE DAILY AT THE SAME TIME EVERY DAY 06/30/14  Yes Collene Gobble, MD  Tadalafil, PAH, (ADCIRCA) 20 MG TABS Take 1 tablet (20 mg total) by mouth daily. 12/11/14  Yes Larey Dresser, MD  torsemide (DEMADEX) 20 MG tablet Take 29m (4 tablets) in am and 437m(2 tablets) in pm. 12/26/14  Yes DaLarey DresserMD  traMADol (ULTRAM) 50 MG tablet Take 100 mg by mouth as needed for moderate pain.    Yes Historical Provider, MD  acetaminophen (TYLENOL) 500 MG tablet Take 1,000 mg by mouth daily as needed (for arthritic pain).    Historical Provider, MD  albuterol (PROAIR HFA) 108 (90 BASE) MCG/ACT inhaler Inhale 2 puffs into the  lungs every 4 (four) hours as needed for wheezing or shortness of breath.     Historical Provider, MD  calcium carbonate (TUMS - DOSED IN MG ELEMENTAL CALCIUM) 500 MG chewable tablet Chew 2 tablets by mouth daily as needed for indigestion or heartburn.     Historical Provider, MD  metolazone (ZAROXOLYN) 2.5 MG tablet Take 1 tablet (2.5 mg total) by mouth once a week. WEDNESDAYS. 01/13/15   DaLarey DresserMD   No Known Allergies  FAMILY HISTORY:  family history includes Allergies in her mother; Arthritis in her mother; Cancer in her cousin and cousin; Emphysema in her mother; Heart disease in her brother and father; Tongue cancer in her paternal grandmother. SOCIAL HISTORY:  reports that she quit smoking about 23 years ago. Her smoking use included Cigarettes. She has a 42 pack-year smoking history. She has never used smokeless tobacco. She reports that she does not drink alcohol or use illicit drugs.  REVIEW OF SYSTEMS:   As per HPI - All other systems reviewed and were neg.  SUBJECTIVE:   VITAL SIGNS: Temp:  [97.4 F (36.3 C)-98.5 F (36.9 C)] 98.5 F (36.9 C) (05/18 0700) Pulse Rate:  [44-118] 44 (05/18 0800) Resp:  [13-28] 16 (05/18 0800) BP: (97-133)/(43-82) 114/62 mmHg (05/18 0800) SpO2:  [77 %-92 %] 86 % (05/18 0846) Weight:  [196 lb 13.9 oz (89.3 kg)-201 lb 1.9 oz (91.227 kg)] 197 lb 15.6 oz (89.8 kg) (05/18 0500)  PHYSICAL EXAMINATION: General:  Chronically ill appearing female, NAD in chair  Neuro:  Awake, alert, appropriate, MAE  HEENT:  Mm moist, no JVD, VM Cardiovascular:  s1s2 irreg Lungs:  resps even non labored on 55% venti mask, few scattered high pitched wheezes R>L Abdomen:  Soft, non tender, +bs  Musculoskeletal:  Warm and dry, 1+ BLE edema    Recent Labs Lab 01/20/15 1840 01/21/15 0258  NA 141 141  K 3.7 4.2  CL 98* 100*  CO2 33* 32  BUN 38* 40*  CREATININE 1.66* 1.71*  GLUCOSE 122* 140*    Recent Labs Lab 01/20/15 1840  HGB 10.1*  HCT  33.2*  WBC 7.4  PLT 180   Dg Chest 2 View  01/20/2015   CLINICAL DATA:  severe sob, has been saturating anywhere from the 70's to the low 90"s, is on O2 in the home, has a mask on tonight, hx chf, htn, copd, is getting lasix as soon as she gets back upstairs  EXAM: CHEST - 2 VIEW  COMPARISON:  11/06/2014  FINDINGS: Increasing pleural effusions, moderate right and small left. Stable cardiomegaly. Worsening bilateral interstitial and airspace edema or infiltrates involving bases more than apices. New consolidation/ atelectasis in basilar segments of both lower lobes. No pneumothorax.  Atheromatous aorta. Visualized skeletal structures are unremarkable.  IMPRESSION: 1. Worsening bilateral edema/infiltrates and asymmetric pleural effusions. 2. Stable cardiomegaly   Electronically Signed   By: Lucrezia Europe M.D.   On: 01/20/2015 21:35    ASSESSMENT / PLAN:  Acute on chronic hypoxic respiratory failure - multifactorial in setting AECOPD, significant Pulm HTN with PA systolic pressure 73 mmHg, significant R heart failure and volume overload/pulmonary edema +/- CAP.  AECOPD  Pulmonary HTN -  Likely secondary in setting COPD, OHS/OSA but ?group 1 contribution r/t RA.  Maintained on adcirca, opsumit.  Was tried on tyvaso without improvement and actually felt better OFF.   Afib RVR  Acute on chronic dCHF  ?CAP  CKD   PLAN -  Continue supplemental O2 and titrate for goal sats 88-90% - wears 6-8L chronically  Cont opsumit, adcirca Cont aggressive diuresis as renal function permits  Empiric abx CAP -- change to rocephin, azithro for ICU CAP coverage  Cont duonebs - change to q6h Replace home dulera with pulmicort, brovana for now  Cont prednisone 71m daily (maintenance dose 551mdaily) Pulmonary hygiene  Cont singulair  Cards will likely repeat RHC  F/u CXR   PCCM will follow.     KaNickolas MadridNP 01/21/2015  10:33 AM Pager: [phone] note  [age]ear old  female with PMH of RA, pulmonary HTN and COPD how presents from cardiologist office after being found to have an O2 sat of 78 on 6L.  Usually on 6-8 liters at home.  Patient was admitted to the SDU for IV diureses.  Patient remained hypoxemic and PCCM was consulted.  Patient currently on 50, saturating 81% sitting up and talking on the phone.  Chest exam with diffuse crackles and decreased BS at the bases.  I  reviewed the CXR myself, pulmonary edema evident with RLL atelectasis.  Hypoxemia: secondary to pulmonary edema and pulmonary HTN.  I suspect patient lives with O2 sats in the 80's.  - Supplemental O2 as ordered.  - Continue active diureses as renal function allows.  - Treat pulm HTN.  Pulmonary HTN: due to RA, OSA and COPD.  - Continue adcirca.  - Continue macitentan.  - Active diureses.  - Supplemental O2.  - Anti-coag.  OSA: on CPAP.  - Continue CPAP while asleep.  COPD exacerbation, chronically on prednisone 5 mg daily  - Increase prednisone to 40 mg daily.  - Do not taper prednisone.  - Duonebs.  - Singulair.  CAP: suspicious on the right.  - D/C doxy.  - Continue zithromax.  - Add rocephine.  - F/U on cultures.  Discussed with NP and cardiology.  Patient seen and examined, agree with above note.  I dictated the care and orders written for this patient under my direction.  Rush Farmer, MD [phone]

## 2015-01-21 NOTE — Progress Notes (Signed)
Patient's ABG results:   pH, Arterial 7.424  pCO2 arterial 48.1 (H)  pO2, Arterial 49.0 (L)  Bicarbonate 31.5 (H)  TCO2 33  Acid-Base Excess 6.0 (H)  O2 Saturation 85.0   Paged MD Philbert Riser to inform of ABG results. Per respiratory therapist, appears patient is compensating and patient is asymptomatic.  Patient currently on home O2 dose of 7 L nasal cannula. Vicie Mutters, RN

## 2015-01-22 ENCOUNTER — Inpatient Hospital Stay (HOSPITAL_COMMUNITY): Payer: Medicare Other

## 2015-01-22 LAB — BASIC METABOLIC PANEL
Anion gap: 13 (ref 5–15)
BUN: 48 mg/dL — ABNORMAL HIGH (ref 6–20)
CHLORIDE: 98 mmol/L — AB (ref 101–111)
CO2: 29 mmol/L (ref 22–32)
Calcium: 8.3 mg/dL — ABNORMAL LOW (ref 8.9–10.3)
Creatinine, Ser: 1.81 mg/dL — ABNORMAL HIGH (ref 0.44–1.00)
GFR, EST AFRICAN AMERICAN: 32 mL/min — AB (ref >60)
GFR, EST NON AFRICAN AMERICAN: 28 mL/min — AB (ref >60)
Glucose, Bld: 137 mg/dL — ABNORMAL HIGH (ref 65–99)
POTASSIUM: 4.1 mmol/L (ref 3.5–5.1)
Sodium: 140 mmol/L (ref 135–145)

## 2015-01-22 LAB — LEGIONELLA ANTIGEN, URINE

## 2015-01-22 LAB — BRAIN NATRIURETIC PEPTIDE: B NATRIURETIC PEPTIDE 5: 210.9 pg/mL — AB (ref 0.0–100.0)

## 2015-01-22 LAB — CBC
HEMATOCRIT: 31.4 % — AB (ref 36.0–46.0)
Hemoglobin: 9.4 g/dL — ABNORMAL LOW (ref 12.0–15.0)
MCH: 26.2 pg (ref 26.0–34.0)
MCHC: 29.9 g/dL — ABNORMAL LOW (ref 30.0–36.0)
MCV: 87.5 fL (ref 78.0–100.0)
Platelets: 188 10*3/uL (ref 150–400)
RBC: 3.59 MIL/uL — ABNORMAL LOW (ref 3.87–5.11)
RDW: 16.6 % — AB (ref 11.5–15.5)
WBC: 8.3 10*3/uL (ref 4.0–10.5)

## 2015-01-22 LAB — GLUCOSE, CAPILLARY
GLUCOSE-CAPILLARY: 131 mg/dL — AB (ref 65–99)
GLUCOSE-CAPILLARY: 187 mg/dL — AB (ref 65–99)
GLUCOSE-CAPILLARY: 141 mg/dL — AB (ref 65–99)

## 2015-01-22 MED ORDER — DOXYCYCLINE HYCLATE 100 MG PO TABS
100.0000 mg | ORAL_TABLET | Freq: Two times a day (BID) | ORAL | Status: DC
  Administered 2015-01-22 – 2015-01-26 (×9): 100 mg via ORAL
  Filled 2015-01-22 (×12): qty 1

## 2015-01-22 MED ORDER — METOLAZONE 2.5 MG PO TABS
2.5000 mg | ORAL_TABLET | Freq: Once | ORAL | Status: AC
  Administered 2015-01-22: 2.5 mg via ORAL
  Filled 2015-01-22: qty 1

## 2015-01-22 MED ORDER — SODIUM CHLORIDE 0.9 % IJ SOLN
10.0000 mL | INTRAMUSCULAR | Status: DC | PRN
  Administered 2015-01-25: 10 mL
  Filled 2015-01-22: qty 40

## 2015-01-22 MED ORDER — TIOTROPIUM BROMIDE MONOHYDRATE 18 MCG IN CAPS
18.0000 ug | ORAL_CAPSULE | Freq: Every day | RESPIRATORY_TRACT | Status: DC
  Administered 2015-01-22 – 2015-01-25 (×4): 18 ug via RESPIRATORY_TRACT
  Filled 2015-01-22: qty 5

## 2015-01-22 MED ORDER — PREDNISONE 5 MG PO TABS
5.0000 mg | ORAL_TABLET | Freq: Every day | ORAL | Status: DC
  Administered 2015-01-23 – 2015-01-24 (×2): 5 mg via ORAL
  Filled 2015-01-22 (×4): qty 1

## 2015-01-22 MED ORDER — BUDESONIDE 0.25 MG/2ML IN SUSP: 0.5000 mg | Freq: Two times a day (BID) | RESPIRATORY_TRACT | Status: DC

## 2015-01-22 MED ORDER — ARFORMOTEROL TARTRATE 15 MCG/2ML IN NEBU: 15.0000 ug | INHALATION_SOLUTION | Freq: Two times a day (BID) | RESPIRATORY_TRACT | Status: DC

## 2015-01-22 NOTE — Progress Notes (Signed)
Peripherally Inserted Central Catheter/Midline Placement  The IV Nurse has discussed with the patient and/or persons authorized to consent for the patient, the purpose of this procedure and the potential benefits and risks involved with this procedure.  The benefits include less needle sticks, lab draws from the catheter and patient may be discharged home with the catheter.  Risks include, but not limited to, infection, bleeding, blood clot (thrombus formation), and puncture of an artery; nerve damage and irregular heat beat.  Alternatives to this procedure were also discussed.  PICC/Midline Placement Documentation  PICC / Midline Double Lumen 25/75/05 PICC Right Basilic 40 cm 0 cm (Active)  Indication for Insertion or Continuance of Line Prolonged intravenous therapies;Chronic illness with exacerbations (CF, Sickle Cell, etc.);Vasoactive infusions 01/22/2015 11:32 AM  Exposed Catheter (cm) 0 cm 01/22/2015 11:32 AM  Site Assessment Clean;Dry;Intact 01/22/2015 11:32 AM  Lumen #1 Status Flushed;Saline locked;Blood return noted 01/22/2015 11:32 AM  Lumen #2 Status Flushed;Saline locked;Blood return noted 01/22/2015 11:32 AM  Dressing Type Transparent 01/22/2015 11:32 AM  Dressing Status Clean;Dry;Intact;Antimicrobial disc in place 01/22/2015 11:32 AM  Line Care Connections checked and tightened 01/22/2015 11:32 AM  Line Adjustment (NICU/IV Team Only) No 01/22/2015 11:32 AM  Dressing Intervention New dressing 01/22/2015 11:32 AM  Dressing Change Due 01/29/15 01/22/2015 11:32 AM       Rolena Infante 01/22/2015, 11:34 AM

## 2015-01-22 NOTE — Progress Notes (Signed)
Patient ID: Tracey Duncan, female   DOB: [birth year], 69 y.o.   MRN: [id number]   SUBJECTIVE: Oxygen saturation better this morning, 89-91% at rest.  She diuresed some but not markedly and creatinine is up a little.    Scheduled Meds: . antiseptic oral rinse  7 mL Mouth Rinse BID  . apixaban  5 mg Oral BID  . arformoterol  15 mcg Nebulization BID  . azithromycin  500 mg Intravenous Q24H  . bisoprolol  10 mg Oral Daily  . budesonide (PULMICORT) nebulizer solution  0.5 mg Nebulization BID  . cefTRIAXone (ROCEPHIN)  IV  1 g Intravenous Q24H  . febuxostat  80 mg Oral Daily  . furosemide  80 mg Intravenous 3 times per day  . gabapentin  300 mg Oral BID  . insulin aspart  0-15 Units Subcutaneous TID WC  . insulin aspart  0-5 Units Subcutaneous QHS  . ipratropium-albuterol  3 mL Nebulization Q6H  . leflunomide  20 mg Oral Daily  . levothyroxine  50 mcg Oral QAC breakfast  . Macitentan  10 mg Oral Daily  . magnesium oxide  400 mg Oral Daily  . metolazone  2.5 mg Oral Once  . montelukast  10 mg Oral QHS  . potassium chloride SA  40 mEq Oral BID  . pravastatin  20 mg Oral QPM  . predniSONE  40 mg Oral Q breakfast  . sertraline  50 mg Oral Daily  . sodium chloride  3 mL Intravenous Q12H  . Tadalafil (PAH)  20 mg Oral Daily   Continuous Infusions:  PRN Meds:.sodium chloride, acetaminophen, calcium carbonate, colchicine, ondansetron (ZOFRAN) IV, sodium chloride, traMADol, zolpidem    Filed Vitals:   01/22/15 0345 01/22/15 0400 01/22/15 0500 01/22/15 0600  BP:  _0  Pulse:  94 101 104  Temp:      TempSrc:      Resp:  _1 Height: _2  (1.626 m)     Weight: 199 lb 15.3 oz (90.7 kg)     SpO2:  88% 88% 87%    Intake/Output Summary (Last 24 hours) at 01/22/15 0738 Last data filed at 01/22/15 0645  Gross per 24 hour  Intake   1160 ml  Output   2325 ml  Net  -1165 ml    LABS: Basic Metabolic Panel:  Recent Labs  01/20/15 1840 01/21/15 0258 01/22/15 0318    NA 141 141 140  K 3.7 4.2 4.1  CL 98* 100* 98*  CO2 33* 32 29  GLUCOSE 122* 140* 137*  BUN 38* 40* 48*  CREATININE 1.66* 1.71* 1.81*  CALCIUM 8.8* 8.5* 8.3*  MG 1.9  --   --    Liver Function Tests:  Recent Labs  01/20/15 1840  AST 16  ALT 14  ALKPHOS 84  BILITOT 0.6  PROT 6.3*  ALBUMIN 3.0*   No results for input(s): LIPASE, AMYLASE in the last 72 hours. CBC:  Recent Labs  01/20/15 1840 01/22/15 0318  WBC 7.4 8.3  NEUTROABS 6.2  --   HGB 10.1* 9.4*  HCT 33.2* 31.4*  MCV 88.8 87.5  PLT 180 188   Cardiac Enzymes: No results for input(s): CKTOTAL, CKMB, CKMBINDEX, TROPONINI in the last 72 hours. BNP: Invalid input(s): POCBNP D-Dimer: No results for input(s): DDIMER in the last 72 hours. Hemoglobin A1C: No results for input(s): HGBA1C in the last 72 hours. Fasting Lipid Panel: No results for input(s): CHOL, HDL, LDLCALC, TRIG, CHOLHDL, LDLDIRECT in the last  72 hours. Thyroid Function Tests: No results for input(s): TSH, T4TOTAL, T3FREE, THYROIDAB in the last 72 hours.  Invalid input(s): FREET3 Anemia Panel: No results for input(s): VITAMINB12, FOLATE, FERRITIN, TIBC, IRON, RETICCTPCT in the last 72 hours.  RADIOLOGY: Dg Chest 2 View  01/20/2015   CLINICAL DATA:  severe sob, has been saturating anywhere from the 70's to the low 90"s, is on O2 in the home, has a mask on tonight, hx chf, htn, copd, is getting lasix as soon as she gets back upstairs  EXAM: CHEST - 2 VIEW  COMPARISON:  11/06/2014  FINDINGS: Increasing pleural effusions, moderate right and small left. Stable cardiomegaly. Worsening bilateral interstitial and airspace edema or infiltrates involving bases more than apices. New consolidation/ atelectasis in basilar segments of both lower lobes. No pneumothorax.  Atheromatous aorta. Visualized skeletal structures are unremarkable.  IMPRESSION: 1. Worsening bilateral edema/infiltrates and asymmetric pleural effusions. 2. Stable cardiomegaly   Electronically  Signed   By: Lucrezia Europe M.D.   On: 01/20/2015 21:35    PHYSICAL EXAM General: NAD Neck: JVP 12-14 cm, no thyromegaly or thyroid nodule.  Lungs: Basilar crackles bilaterally CV: Nondisplaced PMI.  Heart mildly tachy, irregular S1/S2, no S3/S4, no murmur.  1+ ankle edema.   Abdomen: Soft, nontender, no hepatosplenomegaly, no distention.  Neurologic: Alert and oriented x 3.  Psych: Normal affect. Extremities: No clubbing or cyanosis.   TELEMETRY: Reviewed telemetry pt in afib in 80s-100s  ASSESSMENT AND PLAN: 1. Atrial fibrillation: Chronic x years. She would be unlikely to remain in NSR if cardioversion were attempted. Plan rate control. CHADSVASC = 3 at least. No history of GI bleeding. Continue apixaban and bisoprolol.  2. Pulmonary arterial HTN/cor pulmonale: Significant right heart failure. Echo in 4/16 showed moderately dilated and mildly dysfunctional RV and PA systolic pressure 73 mmHg mmHg. PA systolic pressure 80/88 on RHC in 6/15 with normal PCWP. She had minimal improvement with Tyvaso (nearly identical RHC on and off Tyvaso) and actually felt considerably better off Tyvaso. Patient has a group 3 component to her PAH (OHS/OSA and COPD), but suspect a group 1 contribution that could be related to rheumatological disease (rheumatoid arthritis). She has had some improvement with Opsumit and recently started Adcirca. She remains volume overloaded with prominent signs of RV failure, NYHA class IV symptoms.  - Continue oxygen supplementation.  - Continue Opsumit 10 daily and Adcirca 20 daily.  - Continue Lasix 80 mg IV every 8 hrs.Will add metolazone 2.5 today. - If creatinine continues to rise and we are unable to diurese effectively, would consider addition of milrinone for RV support/PA pressure lowering.  - I will place PICC today to follow CVP, co-ox.   - When more diuresed, will likely repeat RHC.  3. CKD: Mild rise in creatinine this morning.  4. RA: Being  treated (Dr. Amil Amen). Continues to be limited by joint pain. 5. CHF: Primarily RV failure, as above.  6. OHS/OSA: Actually had sleep study without significant OSA so is now off CPAP.  7. COPD: On home oxygen. I wonder if she does not have a component of bronchitis or PNA causing AECOPD and worsening her clinical picture.  CXR with edema, cannot rule out PNA. Persistently low oxygen saturation but she is comfortable.  - Will empirically treat for CAP with ceftriaxone/azithromycin.  Sputum culture, urine strep and legionella.  - Increased prednisone to 40 mg daily.  - Pulmonary following.    [name] 01/22/2015 7:38 AM

## 2015-01-22 NOTE — Progress Notes (Signed)
Per Berton Lan, R.N. (IV Team) may access PICC for use.

## 2015-01-23 ENCOUNTER — Inpatient Hospital Stay (HOSPITAL_COMMUNITY): Payer: Medicare Other

## 2015-01-23 DIAGNOSIS — J811 Chronic pulmonary edema: Secondary | ICD-10-CM | POA: Insufficient documentation

## 2015-01-23 DIAGNOSIS — I5033 Acute on chronic diastolic (congestive) heart failure: Principal | ICD-10-CM

## 2015-01-23 DIAGNOSIS — J40 Bronchitis, not specified as acute or chronic: Secondary | ICD-10-CM | POA: Insufficient documentation

## 2015-01-23 DIAGNOSIS — J810 Acute pulmonary edema: Secondary | ICD-10-CM

## 2015-01-23 LAB — CBC
HCT: 31.5 % — ABNORMAL LOW (ref 36.0–46.0)
Hemoglobin: 9.8 g/dL — ABNORMAL LOW (ref 12.0–15.0)
MCH: 27.2 pg (ref 26.0–34.0)
MCHC: 31.1 g/dL (ref 30.0–36.0)
MCV: 87.5 fL (ref 78.0–100.0)
PLATELETS: 207 10*3/uL (ref 150–400)
RBC: 3.6 MIL/uL — AB (ref 3.87–5.11)
RDW: 16.7 % — AB (ref 11.5–15.5)
WBC: 9.5 10*3/uL (ref 4.0–10.5)

## 2015-01-23 LAB — CARBOXYHEMOGLOBIN
Carboxyhemoglobin: 1.4 % (ref 0.5–1.5)
Methemoglobin: 0.9 % (ref 0.0–1.5)
O2 SAT: 59.5 %
TOTAL HEMOGLOBIN: 10.1 g/dL — AB (ref 12.0–16.0)

## 2015-01-23 LAB — GLUCOSE, CAPILLARY
GLUCOSE-CAPILLARY: 113 mg/dL — AB (ref 65–99)
Glucose-Capillary: 151 mg/dL — ABNORMAL HIGH (ref 65–99)
Glucose-Capillary: 131 mg/dL — ABNORMAL HIGH (ref 65–99)

## 2015-01-23 LAB — BASIC METABOLIC PANEL
Anion gap: 11 (ref 5–15)
BUN: 52 mg/dL — AB (ref 6–20)
CHLORIDE: 99 mmol/L — AB (ref 101–111)
CO2: 30 mmol/L (ref 22–32)
CREATININE: 1.77 mg/dL — AB (ref 0.44–1.00)
Calcium: 8.4 mg/dL — ABNORMAL LOW (ref 8.9–10.3)
GFR calc non Af Amer: 29 mL/min — ABNORMAL LOW (ref >60)
GFR, EST AFRICAN AMERICAN: 33 mL/min — AB (ref >60)
GLUCOSE: 118 mg/dL — AB (ref 65–99)
POTASSIUM: 3.6 mmol/L (ref 3.5–5.1)
Sodium: 140 mmol/L (ref 135–145)

## 2015-01-23 MED ORDER — OXYMETAZOLINE HCL 0.05 % NA SOLN
2.0000 | Freq: Two times a day (BID) | NASAL | Status: AC
  Administered 2015-01-23 – 2015-01-26 (×6): 2 via NASAL
  Filled 2015-01-23: qty 15

## 2015-01-23 MED ORDER — METOLAZONE 2.5 MG PO TABS
2.5000 mg | ORAL_TABLET | Freq: Once | ORAL | Status: AC
  Administered 2015-01-23: 2.5 mg via ORAL
  Filled 2015-01-23: qty 1

## 2015-01-23 NOTE — Progress Notes (Signed)
Patient ID: Tracey Duncan, female   DOB: [birth year], 69 y.o.   MRN: [id number]   SUBJECTIVE: Oxygen saturation better this morning, upper 80s on nasal cannula.  She diuresed better yesterday with metolazone, weight down 3 lbs.    Scheduled Meds: . antiseptic oral rinse  7 mL Mouth Rinse BID  . apixaban  5 mg Oral BID  . arformoterol  15 mcg Nebulization BID  . bisoprolol  10 mg Oral Daily  . budesonide (PULMICORT) nebulizer solution  0.5 mg Nebulization BID  . doxycycline  100 mg Oral Q12H  . febuxostat  80 mg Oral Daily  . furosemide  80 mg Intravenous 3 times per day  . gabapentin  300 mg Oral BID  . insulin aspart  0-15 Units Subcutaneous TID WC  . insulin aspart  0-5 Units Subcutaneous QHS  . leflunomide  20 mg Oral Daily  . levothyroxine  50 mcg Oral QAC breakfast  . Macitentan  10 mg Oral Daily  . magnesium oxide  400 mg Oral Daily  . metolazone  2.5 mg Oral Once  . potassium chloride SA  40 mEq Oral BID  . pravastatin  20 mg Oral QPM  . predniSONE  5 mg Oral Q breakfast  . sertraline  50 mg Oral Daily  . sodium chloride  3 mL Intravenous Q12H  . Tadalafil (PAH)  20 mg Oral Daily  . tiotropium  18 mcg Inhalation Daily   Continuous Infusions:  PRN Meds:.sodium chloride, acetaminophen, calcium carbonate, colchicine, ondansetron (ZOFRAN) IV, sodium chloride, sodium chloride, traMADol, zolpidem    Filed Vitals:   01/23/15 0100 01/23/15 0300 01/23/15 0400 01/23/15 0720  BP: 117/61  110/61 98/62  Pulse: 105  99   Temp:  98.3 F (36.8 C)  98.4 F (36.9 C)  TempSrc:  Oral  Oral  Resp:    20  Height:  _0  (1.626 m)    Weight:  196 lb 13.9 oz (89.3 kg)    SpO2: 82%  85% 92%    Intake/Output Summary (Last 24 hours) at 01/23/15 0829 Last data filed at 01/23/15 0650  Gross per 24 hour  Intake 1258.5 ml  Output   3900 ml  Net -2641.5 ml    LABS: Basic Metabolic Panel:  Recent Labs  01/20/15 1840  01/22/15 0318 01/23/15 0315  NA 141  < > 140 140  K 3.7  < >  4.1 3.6  CL 98*  < > 98* 99*  CO2 33*  < > 29 30  GLUCOSE 122*  < > 137* 118*  BUN 38*  < > 48* 52*  CREATININE 1.66*  < > 1.81* 1.77*  CALCIUM 8.8*  < > 8.3* 8.4*  MG 1.9  --   --   --   < > = values in this interval not displayed. Liver Function Tests:  Recent Labs  01/20/15 1840  AST 16  ALT 14  ALKPHOS 84  BILITOT 0.6  PROT 6.3*  ALBUMIN 3.0*   No results for input(s): LIPASE, AMYLASE in the last 72 hours. CBC:  Recent Labs  01/20/15 1840 01/22/15 0318 01/23/15 0315  WBC 7.4 8.3 9.5  NEUTROABS 6.2  --   --   HGB 10.1* 9.4* 9.8*  HCT 33.2* 31.4* 31.5*  MCV 88.8 87.5 87.5  PLT 180 188 207   Cardiac Enzymes: No results for input(s): CKTOTAL, CKMB, CKMBINDEX, TROPONINI in the last 72 hours. BNP: Invalid input(s): POCBNP D-Dimer: No results for input(s): DDIMER in the  last 72 hours. Hemoglobin A1C: No results for input(s): HGBA1C in the last 72 hours. Fasting Lipid Panel: No results for input(s): CHOL, HDL, LDLCALC, TRIG, CHOLHDL, LDLDIRECT in the last 72 hours. Thyroid Function Tests: No results for input(s): TSH, T4TOTAL, T3FREE, THYROIDAB in the last 72 hours.  Invalid input(s): FREET3 Anemia Panel: No results for input(s): VITAMINB12, FOLATE, FERRITIN, TIBC, IRON, RETICCTPCT in the last 72 hours.  RADIOLOGY: Dg Chest 2 View  01/23/2015   CLINICAL DATA:  Pulmonary edema.  Congestive heart failure.  EXAM: CHEST  2 VIEW  COMPARISON:  01/22/2015 and 01/20/2015  FINDINGS: PICC tip is in the superior vena cava in good position. Chronic cardiomegaly. Pulmonary vascularity is slightly increased since yesterday but the pleural effusions have slightly diminished.  IMPRESSION: Persistent congestive heart failure with increased vascular congestion since yesterday.   Electronically Signed   By: Lorriane Shire M.D.   On: 01/23/2015 07:40   Dg Chest 2 View  01/20/2015   CLINICAL DATA:  severe sob, has been saturating anywhere from the 70's to the low 90"s, is on O2 in  the home, has a mask on tonight, hx chf, htn, copd, is getting lasix as soon as she gets back upstairs  EXAM: CHEST - 2 VIEW  COMPARISON:  11/06/2014  FINDINGS: Increasing pleural effusions, moderate right and small left. Stable cardiomegaly. Worsening bilateral interstitial and airspace edema or infiltrates involving bases more than apices. New consolidation/ atelectasis in basilar segments of both lower lobes. No pneumothorax.  Atheromatous aorta. Visualized skeletal structures are unremarkable.  IMPRESSION: 1. Worsening bilateral edema/infiltrates and asymmetric pleural effusions. 2. Stable cardiomegaly   Electronically Signed   By: Lucrezia Europe M.D.   On: 01/20/2015 21:35   Dg Chest Port 1 View  01/22/2015   CLINICAL DATA:  69 year old female with a history of line placement  EXAM: PORTABLE CHEST - 1 VIEW  COMPARISON:  01/20/2015  FINDINGS: Cardiomediastinal silhouette unchanged, partially obscured by overlying lung/pleural disease.  Interstitial and airspace opacities, greater at the right base. Blunting the cardiophrenic angles and costophrenic angles bilaterally.  No pneumothorax.  Interval placement right upper extremity PICC which appears to terminate at the superior vena cava. Atherosclerotic calcifications of the aortic arch.  IMPRESSION: Bilateral interstitial and airspace opacities persist, with likely right greater than left pleural effusions.  Interval placement of right upper extremity PICC, with the tip appearing to terminate in the superior vena cava.  Signed,  Dulcy Fanny. Earleen Newport, DO  [facility]  [facility]   Electronically Signed   By: Corrie Mckusick D.O.   On: 01/22/2015 12:17    PHYSICAL EXAM General: NAD Neck: JVP 12-14 cm, no thyromegaly or thyroid nodule.  Lungs: Basilar crackles bilaterally CV: Nondisplaced PMI.  Heart mildly tachy, irregular S1/S2, no S3/S4, no murmur.  Trace ankle edema.   Abdomen: Soft, nontender, no  hepatosplenomegaly, no distention.  Neurologic: Alert and oriented x 3.  Psych: Normal affect. Extremities: No clubbing or cyanosis.   TELEMETRY: Reviewed telemetry pt in afib in 80s-100s  ASSESSMENT AND PLAN: 1. Atrial fibrillation: Chronic x years. She would be unlikely to remain in NSR if cardioversion were attempted. Plan rate control. CHADSVASC = 3 at least. No history of GI bleeding. Continue apixaban and bisoprolol.  2. Pulmonary arterial HTN/cor pulmonale: Significant right heart failure. Echo in 4/16 showed moderately dilated and mildly dysfunctional RV and PA systolic pressure 73 mmHg mmHg. PA systolic pressure 75/64 on RHC in 6/15 with normal PCWP. She  had minimal improvement with Tyvaso (nearly identical RHC on and off Tyvaso) and actually felt considerably better off Tyvaso. Patient has a group 3 component to her PAH (OHS/OSA and COPD), but suspect a group 1 contribution that could be related to rheumatological disease (rheumatoid arthritis). She has had some improvement with Opsumit and recently started Adcirca. She remains volume overloaded with prominent signs of RV failure, NYHA class IV symptoms. Co-ox 59.5%.  - Continue oxygen supplementation.  - Continue Opsumit 10 daily and Adcirca 20 daily.  - Continue Lasix 80 mg IV every 8 hrs.Will add metolazone 2.5 again today => if she diureses well again today, may be able to go back to po meds tomorrow but will need to reassess by exam and CVP. - If creatinine rises further and we are unable to diurese effectively, would consider addition of milrinone for RV support/PA pressure lowering => today will continue with current plan.   - Would keep over weekend, I will do RHC on Monday.   3. CKD: Creatinine stable but BUN higher.  Hopefully creatinine will remain stable with diuresis/renal venous decongestion.  4. RA: Being treated (Dr. Amil Amen). Continues to be limited by joint pain. 5. CHF: Primarily RV failure, as  above.  6. OHS/OSA: Actually had sleep study without significant OSA so is now off CPAP.  7. COPD: On home oxygen. I wonder if she does not have a component of bronchitis or PNA causing AECOPD and worsening her clinical picture.  CXR with edema, cannot rule out PNA. Persistently low oxygen saturation but she is comfortable. Seen by pulmonology, initially on ceftriaxone/azithro with increased prednisone but has now been changed to doxycycline with prednisone back to home dose.    [name] 01/23/2015 8:29 AM

## 2015-01-23 NOTE — Evaluation (Signed)
Physical Therapy Evaluation Patient Details Name: Tracey Duncan MRN: [id number] DOB: [birth year] Today's Date: 01/23/2015   History of Present Illness  69 yo with history of COPD on home oxygen, OHS/OSA, rheumatoid arthritis, and chronic atrial fibrillation admitted with pulmonary hypertension and a fib.  Clinical Impression  Pt admitted with above diagnosis. Pt currently with functional limitations due to the deficits listed below (see PT Problem List). Pt ambulated 47' with hand held assist. Distance limited by PT due to SaO2 dropping to 75% on 8L O2 walking. Pt will benefit from skilled PT to increase their independence and safety with mobility to allow discharge to the venue listed below.   *    Follow Up Recommendations Home health PT    Equipment Recommendations  None recommended by PT    Recommendations for Other Services OT consult     Precautions / Restrictions Precautions Precaution Comments: monitor O2 Restrictions Weight Bearing Restrictions: No      Mobility  Bed Mobility               General bed mobility comments: NT- up in chair  Transfers Overall transfer level: Needs assistance Equipment used: 1 person hand held assist Transfers: Sit to/from Stand Sit to Stand: Supervision         General transfer comment: cues for hand placement  Ambulation/Gait Ambulation/Gait assistance: Min assist Ambulation Distance (Feet): 48 Feet Assistive device: 1 person hand held assist Gait Pattern/deviations: Step-through pattern   Gait velocity interpretation: Below normal speed for age/gender General Gait Details: SaO2 dropped to 75% on 8L O2 [place], distance limited by PT due to hypoxia, 1/4 dyspnea  Stairs            Wheelchair Mobility    Modified Rankin (Stroke Patients Only)       Balance Overall balance assessment: Needs assistance   Sitting balance-Leahy Scale: Good       Standing balance-Leahy Scale: Fair Standing balance comment:  single UE support for dynamic standing balance                             Pertinent Vitals/Pain Pain Assessment: No/denies pain    Home Living Family/patient expects to be discharged to:: Private residence Living Arrangements: Alone (sister lives close by) Available Help at Discharge: Family;Available PRN/intermittently   Home Access: Level entry       Home Equipment: Walker - 4 wheels;Transport chair;Cane - single point;Other (comment) (home O2, 7L) Additional Comments: uses rollator when going out    Prior Function Level of Independence: Independent with assistive device(s)               Hand Dominance        Extremity/Trunk Assessment   Upper Extremity Assessment: Overall WFL for tasks assessed           Lower Extremity Assessment: Overall WFL for tasks assessed      Cervical / Trunk Assessment: Normal  Communication   Communication: No difficulties  Cognition Arousal/Alertness: Awake/alert Behavior During Therapy: WFL for tasks assessed/performed Overall Cognitive Status: Within Functional Limits for tasks assessed                      General Comments      Exercises        Assessment/Plan    PT Assessment Patient needs continued PT services  PT Diagnosis Generalized weakness;Difficulty walking   PT Problem List Decreased activity tolerance;Cardiopulmonary status  limiting activity;Decreased balance;Decreased mobility  PT Treatment Interventions Gait training;Functional mobility training;Therapeutic activities;Patient/family education;Therapeutic exercise;Balance training   PT Goals (Current goals can be found in the Care Plan section) Acute Rehab PT Goals 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 PT Goal Formulation: With patient Time For Goal Achievement: 02/06/15 Potential to Achieve Goals: Good    Frequency Min 3X/week   Barriers to discharge        Co-evaluation                End of Session Equipment Utilized During Treatment: Gait belt;Oxygen Activity Tolerance: Treatment limited secondary to medical complications (Comment) (hypoxia) Patient left: in chair;with call bell/phone within reach Nurse Communication: Mobility status         Time: 1110-1131 PT Time Calculation (min) (ACUTE ONLY): 21 min   Charges:   PT Evaluation $Initial PT Evaluation Tier I: 1 Procedure     PT G CodesPhilomena Duncan 01/23/2015, 12:26 PM [phone]

## 2015-01-23 NOTE — Discharge Instructions (Addendum)
Information on my medicine - ELIQUIS (apixaban)  This medication education was reviewed with me or my healthcare representative as part of my discharge preparation.  The pharmacist that spoke with me during my hospital stay was:  COOPER, Orpha Bur, [place]  Why was Eliquis prescribed for you? Eliquis was prescribed for you to reduce the risk of a blood clot forming that can cause a stroke if you have a medical condition called atrial fibrillation (a type of irregular heartbeat).  What do You need to know about Eliquis ? Take your Eliquis TWICE DAILY - one tablet in the morning and one tablet in the evening with or without food. If you have difficulty swallowing the tablet whole please discuss with your pharmacist how to take the medication safely.  Take Eliquis exactly as prescribed by your doctor and DO NOT stop taking Eliquis without talking to the doctor who prescribed the medication.  Stopping may increase your risk of developing a stroke.  Refill your prescription before you run out.  After discharge, you should have regular check-up appointments with your healthcare provider that is prescribing your Eliquis.  In the future your dose may need to be changed if your kidney function or weight changes by a significant amount or as you get older.  What do you do if you miss a dose? If you miss a dose, take it as soon as you remember on the same day and resume taking twice daily.  Do not take more than one dose of ELIQUIS at the same time to make up a missed dose.  Important Safety Information A possible side effect of Eliquis is bleeding. You should call your healthcare provider right away if you experience any of the following: ? Bleeding from an injury or your nose that does not stop. ? Unusual colored urine (red or dark brown) or unusual colored stools (red or black). ? Unusual bruising for unknown reasons. ? A serious fall or if you hit your head (even if there is no  bleeding).  Some medicines may interact with Eliquis and might increase your risk of bleeding or clotting while on Eliquis. To help avoid this, consult your healthcare provider or pharmacist prior to using any new prescription or non-prescription medications, including herbals, vitamins, non-steroidal anti-inflammatory drugs (NSAIDs) and supplements.  This website has more information on Eliquis (apixaban): http://www.eliquis.com/eliquis/home   Avoid salty food, limit daily fluid intake to <2L. Check your weight every morning, if weight increase by more than 3 lbs overnight or 5 lbs in a single week, please call cardiology

## 2015-01-23 NOTE — Progress Notes (Signed)
CARDIAC REHAB PHASE I   PRE:  Rate/Rhythm: 81 A fib  BP:  Supine:   Sitting: 101/61  Standing:    SaO2: 86% 6L, 88 c PLB on 6L  MODE:  Ambulation: 170 ft   POST:  Rate/Rhythm: 118 A fib  BP:  Supine:   Sitting: 109/61  Standing:    SaO2: 85 6L  1400-1457   Pt. Walked 170 with 2 person assist, O2 and walker. Pt sat and rested 2x for 3-16mn. O2 dropped to 81%. Encouraged purse lip breathing and was able to get O2 to 84-88%. Pt does require ample rest time and breathing re-ed. Returned to recliner with LE elevation. Pt was practicing IS and flutter valve. Instructed 10x per hour. Pt left on 6L at 88%. Pt. Did complain if SOB while walking and at the end of ambulation.  Tracey Duncan D Tracey Aracena,MS,ACSM-RCEP 01/23/2015 2:49 PM

## 2015-01-23 NOTE — Progress Notes (Signed)
Feels somewhat better Rattling cough prod of green > yellow sputum Ear fullness and nasal stuffiness  Filed Vitals:   01/23/15 0913 01/23/15 0920 01/23/15 1216 01/23/15 1232  BP:  120/84  101/61  Pulse:  96  96  Temp:    98.3 F (36.8 C)  TempSrc:    Oral  Resp:    20  Height:      Weight:      SpO2: 93% 92% 75% 88%   NAD HEENT WNL JVP not visualized @ 90 degrees Bibasilar crackles Reg, no M NABS Minimal pretibial edema  BMET    Component Value Date/Time   NA 140 01/23/2015 0315   K 3.6 01/23/2015 0315   CL 99* 01/23/2015 0315   CO2 30 01/23/2015 0315   GLUCOSE 118* 01/23/2015 0315   BUN 52* 01/23/2015 0315   CREATININE 1.77* 01/23/2015 0315   CALCIUM 8.4* 01/23/2015 0315   GFRNONAA 29* 01/23/2015 0315   GFRAA 33* 01/23/2015 0315    CBC    Component Value Date/Time   WBC 9.5 01/23/2015 0315   RBC 3.60* 01/23/2015 0315   HGB 9.8* 01/23/2015 0315   HCT 31.5* 01/23/2015 0315   PLT 207 01/23/2015 0315   MCV 87.5 01/23/2015 0315   MCH 27.2 01/23/2015 0315   MCHC 31.1 01/23/2015 0315   RDW 16.7* 01/23/2015 0315   LYMPHSABS 0.6* 01/20/2015 1840   MONOABS 0.6 01/20/2015 1840   EOSABS 0.1 01/20/2015 1840   BASOSABS 0.0 01/20/2015 1840    CXR: vasc cong and IS prominence  IMPRESSION: Severe PAH with RV failure CAF with probable component of LV failure Doubt  PNA, suspect bacterial bronchitis Nasal and head congestion  PLAN/REC: Complete 7 days doxycycline Cont diuresis to extent permitted by BP and renal function Afrin X 3 days Cont medical therapy for [facility] Cont supplemental O2 to maintain SpO2 > 92% Would avoid systemic decongestants given PAH  PCCM will sign off. Please call if we can be of further assistance  Merton Border, MD ; [facility] [phone].  After 5:30 PM or weekends, call [phone]

## 2015-01-24 DIAGNOSIS — I272 Other secondary pulmonary hypertension: Secondary | ICD-10-CM

## 2015-01-24 LAB — CBC
HCT: 32.3 % — ABNORMAL LOW (ref 36.0–46.0)
HEMOGLOBIN: 10.1 g/dL — AB (ref 12.0–15.0)
MCH: 27.2 pg (ref 26.0–34.0)
MCHC: 31.3 g/dL (ref 30.0–36.0)
MCV: 86.8 fL (ref 78.0–100.0)
PLATELETS: 186 10*3/uL (ref 150–400)
RBC: 3.72 MIL/uL — ABNORMAL LOW (ref 3.87–5.11)
RDW: 16.6 % — ABNORMAL HIGH (ref 11.5–15.5)
WBC: 9.7 10*3/uL (ref 4.0–10.5)

## 2015-01-24 LAB — BASIC METABOLIC PANEL
ANION GAP: 11 (ref 5–15)
BUN: 57 mg/dL — AB (ref 6–20)
CO2: 32 mmol/L (ref 22–32)
Calcium: 8.3 mg/dL — ABNORMAL LOW (ref 8.9–10.3)
Chloride: 98 mmol/L — ABNORMAL LOW (ref 101–111)
Creatinine, Ser: 1.87 mg/dL — ABNORMAL HIGH (ref 0.44–1.00)
GFR calc Af Amer: 31 mL/min — ABNORMAL LOW (ref >60)
GFR, EST NON AFRICAN AMERICAN: 27 mL/min — AB (ref >60)
GLUCOSE: 95 mg/dL (ref 65–99)
Potassium: 3.2 mmol/L — ABNORMAL LOW (ref 3.5–5.1)
Sodium: 141 mmol/L (ref 135–145)

## 2015-01-24 LAB — GLUCOSE, CAPILLARY
GLUCOSE-CAPILLARY: 146 mg/dL — AB (ref 65–99)
Glucose-Capillary: 150 mg/dL — ABNORMAL HIGH (ref 65–99)
Glucose-Capillary: 139 mg/dL — ABNORMAL HIGH (ref 65–99)
Glucose-Capillary: 104 mg/dL — ABNORMAL HIGH (ref 65–99)
Glucose-Capillary: 128 mg/dL — ABNORMAL HIGH (ref 65–99)

## 2015-01-24 MED ORDER — PREDNISONE 20 MG PO TABS
40.0000 mg | ORAL_TABLET | Freq: Every day | ORAL | Status: DC
  Administered 2015-01-25 – 2015-01-26 (×2): 40 mg via ORAL
  Filled 2015-01-24 (×3): qty 2

## 2015-01-24 NOTE — Progress Notes (Addendum)
Patient ID: Tracey Duncan, female   DOB: 1946/01/08, 69 y.o.   MRN: [id number]   SUBJECTIVE:   Feels much better. Diuresed well on metolazone. CVP 3. Denies dyspnea. Feels arthritis is getting worse.   Cr 1.77->1.87   Scheduled Meds: . antiseptic oral rinse  7 mL Mouth Rinse BID  . apixaban  5 mg Oral BID  . arformoterol  15 mcg Nebulization BID  . bisoprolol  10 mg Oral Daily  . budesonide (PULMICORT) nebulizer solution  0.5 mg Nebulization BID  . doxycycline  100 mg Oral Q12H  . febuxostat  80 mg Oral Daily  . gabapentin  300 mg Oral BID  . insulin aspart  0-15 Units Subcutaneous TID WC  . insulin aspart  0-5 Units Subcutaneous QHS  . leflunomide  20 mg Oral Daily  . levothyroxine  50 mcg Oral QAC breakfast  . Macitentan  10 mg Oral Daily  . magnesium oxide  400 mg Oral Daily  . oxymetazoline  2 spray Each Nare BID  . potassium chloride SA  40 mEq Oral BID  . pravastatin  20 mg Oral QPM  . [START ON 01/25/2015] predniSONE  40 mg Oral Q breakfast  . sertraline  50 mg Oral Daily  . sodium chloride  3 mL Intravenous Q12H  . Tadalafil (PAH)  20 mg Oral Daily  . tiotropium  18 mcg Inhalation Daily   Continuous Infusions:  PRN Meds:.sodium chloride, acetaminophen, calcium carbonate, colchicine, ondansetron (ZOFRAN) IV, sodium chloride, sodium chloride, traMADol, zolpidem    Filed Vitals:   01/24/15 0405 01/24/15 0801 01/24/15 0856 01/24/15 1200  BP: 111/73 95/69  93/42  Pulse: 96 99  94  Temp: 97.9 F (36.6 C) 98.6 F (37 C)  98.6 F (37 C)  TempSrc: Oral Oral  Oral  Resp: 18 20    Height:      Weight:      SpO2: 96% 86% 90% 87%    Intake/Output Summary (Last 24 hours) at 01/24/15 1454 Last data filed at 01/24/15 1400  Gross per 24 hour  Intake    643 ml  Output   5025 ml  Net  -4382 ml    LABS: Basic Metabolic Panel:  Recent Labs  01/23/15 0315 01/24/15 0300  NA 140 141  K 3.6 3.2*  CL 99* 98*  CO2 30 32  GLUCOSE 118* 95  BUN 52* 57*  CREATININE  1.77* 1.87*  CALCIUM 8.4* 8.3*   Liver Function Tests: No results for input(s): AST, ALT, ALKPHOS, BILITOT, PROT, ALBUMIN in the last 72 hours. No results for input(s): LIPASE, AMYLASE in the last 72 hours. CBC:  Recent Labs  01/23/15 0315 01/24/15 0300  WBC 9.5 9.7  HGB 9.8* 10.1*  HCT 31.5* 32.3*  MCV 87.5 86.8  PLT 207 186   Cardiac Enzymes: No results for input(s): CKTOTAL, CKMB, CKMBINDEX, TROPONINI in the last 72 hours. BNP: Invalid input(s): POCBNP D-Dimer: No results for input(s): DDIMER in the last 72 hours. Hemoglobin A1C: No results for input(s): HGBA1C in the last 72 hours. Fasting Lipid Panel: No results for input(s): CHOL, HDL, LDLCALC, TRIG, CHOLHDL, LDLDIRECT in the last 72 hours. Thyroid Function Tests: No results for input(s): TSH, T4TOTAL, T3FREE, THYROIDAB in the last 72 hours.  Invalid input(s): FREET3 Anemia Panel: No results for input(s): VITAMINB12, FOLATE, FERRITIN, TIBC, IRON, RETICCTPCT in the last 72 hours.  RADIOLOGY: Dg Chest 2 View  01/23/2015   CLINICAL DATA:  Pulmonary edema.  Congestive heart failure.  EXAM: CHEST  2 VIEW  COMPARISON:  01/22/2015 and 01/20/2015  FINDINGS: PICC tip is in the superior vena cava in good position. Chronic cardiomegaly. Pulmonary vascularity is slightly increased since yesterday but the pleural effusions have slightly diminished.  IMPRESSION: Persistent congestive heart failure with increased vascular congestion since yesterday.   Electronically Signed   By: Lorriane Shire M.D.   On: 01/23/2015 07:40   Dg Chest 2 View  01/20/2015   CLINICAL DATA:  severe sob, has been saturating anywhere from the 70's to the low 90"s, is on O2 in the home, has a mask on tonight, hx chf, htn, copd, is getting lasix as soon as she gets back upstairs  EXAM: CHEST - 2 VIEW  COMPARISON:  11/06/2014  FINDINGS: Increasing pleural effusions, moderate right and small left. Stable cardiomegaly. Worsening bilateral interstitial and airspace  edema or infiltrates involving bases more than apices. New consolidation/ atelectasis in basilar segments of both lower lobes. No pneumothorax.  Atheromatous aorta. Visualized skeletal structures are unremarkable.  IMPRESSION: 1. Worsening bilateral edema/infiltrates and asymmetric pleural effusions. 2. Stable cardiomegaly   Electronically Signed   By: Lucrezia Europe M.D.   On: 01/20/2015 21:35   Dg Chest Port 1 View  01/22/2015   CLINICAL DATA:  69 year old female with a history of line placement  EXAM: PORTABLE CHEST - 1 VIEW  COMPARISON:  01/20/2015  FINDINGS: Cardiomediastinal silhouette unchanged, partially obscured by overlying lung/pleural disease.  Interstitial and airspace opacities, greater at the right base. Blunting the cardiophrenic angles and costophrenic angles bilaterally.  No pneumothorax.  Interval placement right upper extremity PICC which appears to terminate at the superior vena cava. Atherosclerotic calcifications of the aortic arch.  IMPRESSION: Bilateral interstitial and airspace opacities persist, with likely right greater than left pleural effusions.  Interval placement of right upper extremity PICC, with the tip appearing to terminate in the superior vena cava.  Signed,  Dulcy Fanny. Earleen Newport, DO  [facility]  [facility]   Electronically Signed   By: Corrie Mckusick D.O.   On: 01/22/2015 12:17    PHYSICAL EXAM General: NAD Neck: JVP flat, no thyromegaly or thyroid nodule.  Lungs: minimal Basilar crackles at bases bilaterally CV: Nondisplaced PMI.  Heart mildly tachy, irregular S1/S2, no S3/S4, no murmur.  Trace ankle edema.   Abdomen: Soft, nontender, no hepatosplenomegaly, no distention.  Neurologic: Alert and oriented x 3.  Psych: Normal affect. Extremities: No clubbing or cyanosis.   TELEMETRY: Reviewed telemetry pt in afib in 80s-100s  ASSESSMENT AND PLAN: 1. Atrial fibrillation: Chronic x years. She would be unlikely to remain in  NSR if cardioversion were attempted. Plan rate control. CHADSVASC = 3 at least. No history of GI bleeding. Continue apixaban and bisoprolol.  2. Pulmonary arterial HTN/cor pulmonale: Significant right heart failure. Echo in 4/16 showed moderately dilated and mildly dysfunctional RV and PA systolic pressure 73 mmHg mmHg. PA systolic pressure 66/29 on RHC in 6/15 with normal PCWP. She had minimal improvement with Tyvaso (nearly identical RHC on and off Tyvaso) and actually felt considerably better off Tyvaso. Patient has a group 3 component to her PAH (OHS/OSA and COPD), but suspect a group 1 contribution that could be related to rheumatological disease (rheumatoid arthritis). She has had some improvement with Opsumit and recently started Adcirca. She remains volume overloaded with prominent signs of RV failure, NYHA class IV symptoms. Co-ox 59.5%.  - Continue oxygen supplementation.  - Continue Opsumit 10 daily and Adcirca 20 daily.  Can increase to 40 daily as BP toelrates.  - CVP now 2-3. Stop diuretics. Can start torsemide tomorrow. RHC on Monday with Dr. Aundra Dubin .Marland Kitchen   3. CKD: Creatinine stable  4. RA: Being treated (Dr. Amil Amen). Continues to be limited by joint pain. Will increase prednisone to 35m x 3 days. 5. CHF: Primarily RV failure, as above.  6. OHS/OSA: Actually had sleep study without significant OSA so is now off CPAP.  7. Acute on chronic respiratory failure with PAH & COPD: On home oxygen.P/CCM seeing - has now been changed to doxycycline with prednisone back to home dose.    BGlori Bickers5/21/2016 2:54 PM

## 2015-01-25 LAB — GLUCOSE, CAPILLARY
Glucose-Capillary: 146 mg/dL — ABNORMAL HIGH (ref 65–99)
Glucose-Capillary: 107 mg/dL — ABNORMAL HIGH (ref 65–99)
Glucose-Capillary: 242 mg/dL — ABNORMAL HIGH (ref 65–99)

## 2015-01-25 LAB — CARBOXYHEMOGLOBIN
Carboxyhemoglobin: 1.2 % (ref 0.5–1.5)
METHEMOGLOBIN: 1.1 % (ref 0.0–1.5)
O2 SAT: 64.6 %
Total hemoglobin: 10.5 g/dL — ABNORMAL LOW (ref 12.0–16.0)

## 2015-01-25 MED ORDER — ENOXAPARIN SODIUM 40 MG/0.4ML ~~LOC~~ SOLN
40.0000 mg | Freq: Two times a day (BID) | SUBCUTANEOUS | Status: DC
  Administered 2015-01-25 – 2015-01-26 (×2): 40 mg via SUBCUTANEOUS
  Filled 2015-01-25 (×5): qty 0.4

## 2015-01-25 MED ORDER — ASPIRIN 81 MG PO CHEW
81.0000 mg | CHEWABLE_TABLET | ORAL | Status: AC
  Administered 2015-01-26: 81 mg via ORAL
  Filled 2015-01-25: qty 1

## 2015-01-25 MED ORDER — SODIUM CHLORIDE 0.9 % IJ SOLN: 3.0000 mL | INTRAMUSCULAR | Status: DC | PRN

## 2015-01-25 MED ORDER — TORSEMIDE 20 MG PO TABS
20.0000 mg | ORAL_TABLET | Freq: Two times a day (BID) | ORAL | Status: DC
  Administered 2015-01-25: 20 mg via ORAL
  Filled 2015-01-25 (×4): qty 1

## 2015-01-25 MED ORDER — POTASSIUM CHLORIDE CRYS ER 20 MEQ PO TBCR
40.0000 meq | EXTENDED_RELEASE_TABLET | Freq: Once | ORAL | Status: AC
Start: 2015-01-25 — End: 2015-01-25
  Administered 2015-01-25: 40 meq via ORAL

## 2015-01-25 MED ORDER — SODIUM CHLORIDE 0.9 % IJ SOLN
3.0000 mL | Freq: Two times a day (BID) | INTRAMUSCULAR | Status: DC
  Administered 2015-01-25: 3 mL via INTRAVENOUS

## 2015-01-25 MED ORDER — SODIUM CHLORIDE 0.9 % IV SOLN: 250.0000 mL | INTRAVENOUS | Status: DC | PRN

## 2015-01-25 MED ORDER — SODIUM CHLORIDE 0.9 % IV SOLN
INTRAVENOUS | Status: DC
  Administered 2015-01-26: 05:00:00 via INTRAVENOUS

## 2015-01-25 NOTE — Progress Notes (Signed)
Patient ID: Tracey Duncan, female   DOB: Aug 16, 1946, 69 y.o.   MRN: [id number]   SUBJECTIVE:   Feels much better. Diuresed well on metolazone which was stopped yesterday. CVP 5. Denies dyspnea.   Arthritis improved on high-dose prednisone. BMET pending.   Cr 1.77->1.87-> pending  Scheduled Meds: . antiseptic oral rinse  7 mL Mouth Rinse BID  . arformoterol  15 mcg Nebulization BID  . bisoprolol  10 mg Oral Daily  . budesonide (PULMICORT) nebulizer solution  0.5 mg Nebulization BID  . doxycycline  100 mg Oral Q12H  . febuxostat  80 mg Oral Daily  . gabapentin  300 mg Oral BID  . insulin aspart  0-15 Units Subcutaneous TID WC  . insulin aspart  0-5 Units Subcutaneous QHS  . leflunomide  20 mg Oral Daily  . levothyroxine  50 mcg Oral QAC breakfast  . Macitentan  10 mg Oral Daily  . magnesium oxide  400 mg Oral Daily  . oxymetazoline  2 spray Each Nare BID  . potassium chloride SA  40 mEq Oral BID  . pravastatin  20 mg Oral QPM  . predniSONE  40 mg Oral Q breakfast  . sertraline  50 mg Oral Daily  . sodium chloride  3 mL Intravenous Q12H  . Tadalafil (PAH)  20 mg Oral Daily  . tiotropium  18 mcg Inhalation Daily   Continuous Infusions:  PRN Meds:.sodium chloride, acetaminophen, calcium carbonate, colchicine, ondansetron (ZOFRAN) IV, sodium chloride, sodium chloride, traMADol, zolpidem    Filed Vitals:   01/25/15 0500 01/25/15 0819 01/25/15 0902 01/25/15 1000  BP:  89/52    Pulse:  105  109  Temp:  99 F (37.2 C)    TempSrc:  Oral    Resp:  18    Height:      Weight: 86.6 kg (190 lb 14.7 oz)     SpO2:  87% 93% 96%    Intake/Output Summary (Last 24 hours) at 01/25/15 1201 Last data filed at 01/25/15 1000  Gross per 24 hour  Intake    540 ml  Output   1925 ml  Net  -1385 ml    LABS: Basic Metabolic Panel:  Recent Labs  01/23/15 0315 01/24/15 0300  NA 140 141  K 3.6 3.2*  CL 99* 98*  CO2 30 32  GLUCOSE 118* 95  BUN 52* 57*  CREATININE 1.77* 1.87*    CALCIUM 8.4* 8.3*   Liver Function Tests: No results for input(s): AST, ALT, ALKPHOS, BILITOT, PROT, ALBUMIN in the last 72 hours. No results for input(s): LIPASE, AMYLASE in the last 72 hours. CBC:  Recent Labs  01/23/15 0315 01/24/15 0300  WBC 9.5 9.7  HGB 9.8* 10.1*  HCT 31.5* 32.3*  MCV 87.5 86.8  PLT 207 186   Cardiac Enzymes: No results for input(s): CKTOTAL, CKMB, CKMBINDEX, TROPONINI in the last 72 hours. BNP: Invalid input(s): POCBNP D-Dimer: No results for input(s): DDIMER in the last 72 hours. Hemoglobin A1C: No results for input(s): HGBA1C in the last 72 hours. Fasting Lipid Panel: No results for input(s): CHOL, HDL, LDLCALC, TRIG, CHOLHDL, LDLDIRECT in the last 72 hours. Thyroid Function Tests: No results for input(s): TSH, T4TOTAL, T3FREE, THYROIDAB in the last 72 hours.  Invalid input(s): FREET3 Anemia Panel: No results for input(s): VITAMINB12, FOLATE, FERRITIN, TIBC, IRON, RETICCTPCT in the last 72 hours.  RADIOLOGY: Dg Chest 2 View  01/23/2015   CLINICAL DATA:  Pulmonary edema.  Congestive heart failure.  EXAM: CHEST  2 VIEW  COMPARISON:  01/22/2015 and 01/20/2015  FINDINGS: PICC tip is in the superior vena cava in good position. Chronic cardiomegaly. Pulmonary vascularity is slightly increased since yesterday but the pleural effusions have slightly diminished.  IMPRESSION: Persistent congestive heart failure with increased vascular congestion since yesterday.   Electronically Signed   By: Lorriane Shire M.D.   On: 01/23/2015 07:40   Dg Chest 2 View  01/20/2015   CLINICAL DATA:  severe sob, has been saturating anywhere from the 70's to the low 90"s, is on O2 in the home, has a mask on tonight, hx chf, htn, copd, is getting lasix as soon as she gets back upstairs  EXAM: CHEST - 2 VIEW  COMPARISON:  11/06/2014  FINDINGS: Increasing pleural effusions, moderate right and small left. Stable cardiomegaly. Worsening bilateral interstitial and airspace edema or  infiltrates involving bases more than apices. New consolidation/ atelectasis in basilar segments of both lower lobes. No pneumothorax.  Atheromatous aorta. Visualized skeletal structures are unremarkable.  IMPRESSION: 1. Worsening bilateral edema/infiltrates and asymmetric pleural effusions. 2. Stable cardiomegaly   Electronically Signed   By: Lucrezia Europe M.D.   On: 01/20/2015 21:35   Dg Chest Port 1 View  01/22/2015   CLINICAL DATA:  69 year old female with a history of line placement  EXAM: PORTABLE CHEST - 1 VIEW  COMPARISON:  01/20/2015  FINDINGS: Cardiomediastinal silhouette unchanged, partially obscured by overlying lung/pleural disease.  Interstitial and airspace opacities, greater at the right base. Blunting the cardiophrenic angles and costophrenic angles bilaterally.  No pneumothorax.  Interval placement right upper extremity PICC which appears to terminate at the superior vena cava. Atherosclerotic calcifications of the aortic arch.  IMPRESSION: Bilateral interstitial and airspace opacities persist, with likely right greater than left pleural effusions.  Interval placement of right upper extremity PICC, with the tip appearing to terminate in the superior vena cava.  Signed,  Dulcy Fanny. Earleen Newport, DO  [facility]  [facility]   Electronically Signed   By: Corrie Mckusick D.O.   On: 01/22/2015 12:17    PHYSICAL EXAM General: NAD Neck: JVP flat, no thyromegaly or thyroid nodule.  Lungs: clear with decreased BS throughout. CV: Nondisplaced PMI.  Heart mildly tachy, irregular S1/S2, no S3/S4, no murmur.  Trace ankle edema.   Abdomen: Soft, nontender, no hepatosplenomegaly, no distention.  Neurologic: Alert and oriented x 3.  Psych: Normal affect. Extremities: No clubbing or cyanosis.   TELEMETRY: Reviewed telemetry pt in afib in 80s-100s  ASSESSMENT AND PLAN: 1. Atrial fibrillation: Chronic x years. She would be unlikely to remain in NSR if  cardioversion were attempted. Plan rate control. CHADSVASC = 3 at least. No history of GI bleeding. Continue apixaban and bisoprolol.  2. Pulmonary arterial HTN/cor pulmonale: Significant right heart failure. Echo in 4/16 showed moderately dilated and mildly dysfunctional RV and PA systolic pressure 73 mmHg mmHg. PA systolic pressure 86/76 on RHC in 6/15 with normal PCWP. She had minimal improvement with Tyvaso (nearly identical RHC on and off Tyvaso) and actually felt considerably better off Tyvaso. Patient has a group 3 component to her PAH (OHS/OSA and COPD), but suspect a group 1 contribution that could be related to rheumatological disease (rheumatoid arthritis). She has had some improvement with Opsumit and recently started Adcirca. She remains volume overloaded with prominent signs of RV failure, NYHA class IV symptoms. Co-ox 59.5%.  - Continue oxygen supplementation.  - Continue Opsumit 10 daily and Adcirca 20 daily. Can increase to 40  daily as BP toelrates.  - CVP now 4-5.Will restart low-dose torsemide today. RHC tomorrow with Dr. Aundra Dubin .Marland Kitchen   3. CKD: Creatinine stable  4. RA: Being treated (Dr. Amil Amen). Continues to be limited by joint pain. Will increase prednisone to 66m x 3 days today is day 2/3. 5. CHF: Primarily RV failure, as above.  6. OHS/OSA: Actually had sleep study without significant OSA so is now off CPAP.  7. Acute on chronic respiratory failure with PAH & COPD: On home oxygen.P/CCM seeing - has now been changed to doxycycline with prednisone back to home dose.    BGlori BickersMD 01/25/2015 12:01 PM

## 2015-01-26 ENCOUNTER — Encounter (HOSPITAL_COMMUNITY)
Admission: AD | Disposition: A | Discharge status: Home Health | Payer: Medicare Other | Source: Ambulatory Visit | Attending: Cardiology

## 2015-01-26 ENCOUNTER — Encounter (HOSPITAL_COMMUNITY): Payer: Self-pay | Admitting: Cardiology

## 2015-01-26 DIAGNOSIS — I270 Primary pulmonary hypertension: Secondary | ICD-10-CM

## 2015-01-26 DIAGNOSIS — I4891 Unspecified atrial fibrillation: Secondary | ICD-10-CM

## 2015-01-26 DIAGNOSIS — I509 Heart failure, unspecified: Secondary | ICD-10-CM

## 2015-01-26 HISTORY — PX: CARDIAC CATHETERIZATION: SHX172

## 2015-01-26 LAB — POCT I-STAT 3, VENOUS BLOOD GAS (G3P V)
ACID-BASE EXCESS: 3 mmol/L — AB (ref 0.0–2.0)
Acid-Base Excess: 3 mmol/L — ABNORMAL HIGH (ref 0.0–2.0)
BICARBONATE: 28.2 meq/L — AB (ref 20.0–24.0)
BICARBONATE: 28.5 meq/L — AB (ref 20.0–24.0)
O2 Saturation: 58 %
O2 Saturation: 62 %
PCO2 VEN: 47.7 mmHg (ref 45.0–50.0)
PH VEN: 7.38 — AB (ref 7.250–7.300)
TCO2: 30 mmol/L (ref 0–100)
TCO2: 30 mmol/L (ref 0–100)
pCO2, Ven: 47.2 mmHg (ref 45.0–50.0)
pH, Ven: 7.389 — ABNORMAL HIGH (ref 7.250–7.300)
pO2, Ven: 31 mmHg (ref 30.0–45.0)
pO2, Ven: 33 mmHg (ref 30.0–45.0)

## 2015-01-26 LAB — BASIC METABOLIC PANEL
Anion gap: 10 (ref 5–15)
BUN: 54 mg/dL — AB (ref 6–20)
CO2: 28 mmol/L (ref 22–32)
Calcium: 8.2 mg/dL — ABNORMAL LOW (ref 8.9–10.3)
Chloride: 101 mmol/L (ref 101–111)
Creatinine, Ser: 1.6 mg/dL — ABNORMAL HIGH (ref 0.44–1.00)
GFR calc non Af Amer: 32 mL/min — ABNORMAL LOW (ref >60)
GFR, EST AFRICAN AMERICAN: 37 mL/min — AB (ref >60)
Glucose, Bld: 118 mg/dL — ABNORMAL HIGH (ref 65–99)
Potassium: 3.7 mmol/L (ref 3.5–5.1)
SODIUM: 139 mmol/L (ref 135–145)

## 2015-01-26 LAB — GLUCOSE, CAPILLARY
Glucose-Capillary: 194 mg/dL — ABNORMAL HIGH (ref 65–99)
Glucose-Capillary: 114 mg/dL — ABNORMAL HIGH (ref 65–99)

## 2015-01-26 LAB — PROTIME-INR
INR: 1.4 (ref 0.00–1.49)
Prothrombin Time: 17.3 seconds — ABNORMAL HIGH (ref 11.6–15.2)

## 2015-01-26 SURGERY — RIGHT HEART CATH
Anesthesia: LOCAL

## 2015-01-26 MED ORDER — SODIUM CHLORIDE 0.9 % IJ SOLN: 3.0000 mL | INTRAMUSCULAR | Status: DC | PRN

## 2015-01-26 MED ORDER — SODIUM CHLORIDE 0.9 % IJ SOLN: 3.0000 mL | Freq: Two times a day (BID) | INTRAMUSCULAR | Status: DC

## 2015-01-26 MED ORDER — MIDAZOLAM HCL 2 MG/2ML IJ SOLN
INTRAMUSCULAR | Status: DC | PRN
  Administered 2015-01-26: 0.5 mg via INTRAVENOUS

## 2015-01-26 MED ORDER — SODIUM CHLORIDE 0.9 % IV SOLN: 250.0000 mL | INTRAVENOUS | Status: DC | PRN

## 2015-01-26 MED ORDER — HEPARIN (PORCINE) IN NACL 2-0.9 UNIT/ML-% IJ SOLN
INTRAMUSCULAR | Status: AC
  Filled 2015-01-26: qty 1000

## 2015-01-26 MED ORDER — TORSEMIDE 20 MG PO TABS
80.0000 mg | ORAL_TABLET | Freq: Two times a day (BID) | ORAL | Status: DC
  Administered 2015-01-26: 80 mg via ORAL
  Filled 2015-01-26 (×3): qty 4

## 2015-01-26 MED ORDER — MIDAZOLAM HCL 2 MG/2ML IJ SOLN
INTRAMUSCULAR | Status: AC
  Filled 2015-01-26: qty 2

## 2015-01-26 MED ORDER — NITROGLYCERIN 1 MG/10 ML FOR IR/CATH LAB
INTRA_ARTERIAL | Status: AC
  Filled 2015-01-26: qty 10

## 2015-01-26 MED ORDER — APIXABAN 5 MG PO TABS
5.0000 mg | ORAL_TABLET | Freq: Two times a day (BID) | ORAL | Status: DC
  Filled 2015-01-26: qty 1

## 2015-01-26 MED ORDER — SODIUM CHLORIDE 0.9 % IJ SOLN
3.0000 mL | Freq: Two times a day (BID) | INTRAMUSCULAR | Status: DC
  Administered 2015-01-26: 3 mL via INTRAVENOUS

## 2015-01-26 MED ORDER — LIDOCAINE HCL (PF) 1 % IJ SOLN
INTRAMUSCULAR | Status: AC
  Filled 2015-01-26: qty 30

## 2015-01-26 MED ORDER — APIXABAN 5 MG PO TABS: 5.0000 mg | ORAL_TABLET | Freq: Two times a day (BID) | ORAL | Status: DC

## 2015-01-26 MED ORDER — FENTANYL CITRATE (PF) 100 MCG/2ML IJ SOLN
INTRAMUSCULAR | Status: DC | PRN
  Administered 2015-01-26: 25 ug via INTRAVENOUS

## 2015-01-26 MED ORDER — FENTANYL CITRATE (PF) 100 MCG/2ML IJ SOLN
INTRAMUSCULAR | Status: AC
  Filled 2015-01-26: qty 2

## 2015-01-26 MED ORDER — DOXYCYCLINE HYCLATE 100 MG PO TABS: 100.0000 mg | ORAL_TABLET | Freq: Two times a day (BID) | ORAL | Status: DC

## 2015-01-26 MED ORDER — ACETAMINOPHEN 325 MG PO TABS: 650.0000 mg | ORAL_TABLET | ORAL | Status: DC | PRN

## 2015-01-26 MED ORDER — ONDANSETRON HCL 4 MG/2ML IJ SOLN: 4.0000 mg | Freq: Four times a day (QID) | INTRAMUSCULAR | Status: DC | PRN

## 2015-01-26 SURGICAL SUPPLY — 5 items
CATH BALLN WEDGE 5F 110CM (CATHETERS) ×2 IMPLANT
KIT HEART RIGHT NAMIC (KITS) ×2 IMPLANT
PACK CARDIAC CATHETERIZATION (CUSTOM PROCEDURE TRAY) ×2 IMPLANT
SHEATH FAST CATH BRACH 5F 5CM (SHEATH) ×2 IMPLANT
TRANSDUCER W/STOPCOCK (MISCELLANEOUS) ×4 IMPLANT

## 2015-01-26 NOTE — Care Management Note (Signed)
Case Management Note  Patient Details  Name: Tracey Duncan MRN: [id number] Date of Birth: [birth year]  Subjective/Objective:       Heart failure             Action/Plan: lives at home, has home o2 w [facility] home[facility]  Expected Discharge Date:                  Expected Discharge Plan:  Tracey Duncan  In-House Referral:     Discharge planning Services  CM Consult  Post Acute Care Choice:  Home Health Choice offered to:  Patient  DME Arranged:    DME Agency:     HH Arranged:  RN, Disease Management [place] Agency:  [place]  Status of Service:     Medicare Important Message Given:  Yes Date Medicare IM Given:  01/23/15 Medicare IM give by:  debbie Sohum Delillo rn,bsn Date Additional Medicare IM Given:  01/26/15 Additional Medicare Important Message give by:  debbie Bailee Metter rn,bsn  If discussed at Long Length of Stay Meetings, dates discussed:  01/27/15  Additional Comments: went over hhc agencies w pt. She get home o2 w [facility] and would like to use them for hhrn, ref to tiffany w [facility] for hhrn.   Tracey Leigh, RN 01/26/2015, 9:31 AM

## 2015-01-26 NOTE — Progress Notes (Addendum)
Patient ID: Tracey Duncan, female   DOB: July 13, 1946, 69 y.o.   MRN: [id number]   SUBJECTIVE:   Feels much better. Diuresed well on metolazone which was stopped over the weekend. CVP not elevated. Denies dyspnea.   Arthritis improved on high-dose prednisone. BMET needs to be sent today.    Cr 1.77->1.87-> pending  RHC done today: improved filling pressures, mild pulmonary venous hypertension.    Scheduled Meds: . antiseptic oral rinse  7 mL Mouth Rinse BID  . apixaban  5 mg Oral BID  . arformoterol  15 mcg Nebulization BID  . bisoprolol  10 mg Oral Daily  . budesonide (PULMICORT) nebulizer solution  0.5 mg Nebulization BID  . doxycycline  100 mg Oral Q12H  . febuxostat  80 mg Oral Daily  . gabapentin  300 mg Oral BID  . insulin aspart  0-15 Units Subcutaneous TID WC  . insulin aspart  0-5 Units Subcutaneous QHS  . leflunomide  20 mg Oral Daily  . levothyroxine  50 mcg Oral QAC breakfast  . Macitentan  10 mg Oral Daily  . magnesium oxide  400 mg Oral Daily  . oxymetazoline  2 spray Each Nare BID  . potassium chloride SA  40 mEq Oral BID  . pravastatin  20 mg Oral QPM  . predniSONE  40 mg Oral Q breakfast  . sertraline  50 mg Oral Daily  . sodium chloride  3 mL Intravenous Q12H  . sodium chloride  3 mL Intravenous Q12H  . sodium chloride  3 mL Intravenous Q12H  . sodium chloride  3 mL Intravenous Q12H  . Tadalafil (PAH)  20 mg Oral Daily  . tiotropium  18 mcg Inhalation Daily  . torsemide  80 mg Oral BID   Continuous Infusions: . sodium chloride 10 mL/hr at 01/26/15 0507   PRN Meds:.sodium chloride, sodium chloride, sodium chloride, sodium chloride, acetaminophen, acetaminophen, calcium carbonate, colchicine, fentaNYL, midazolam, ondansetron (ZOFRAN) IV, ondansetron (ZOFRAN) IV, sodium chloride, sodium chloride, sodium chloride, sodium chloride, sodium chloride, traMADol, zolpidem    Filed Vitals:   01/26/15 0352 01/26/15 0446 01/26/15 0715 01/26/15 0802  BP: 115/75   103/46   Pulse: 84     Temp: 98.5 F (36.9 C)  98.4 F (36.9 C)   TempSrc: Oral  Oral   Resp:   18   Height:      Weight:  192 lb 0.3 oz (87.1 kg)    SpO2: 94%  91% 91%    Intake/Output Summary (Last 24 hours) at 01/26/15 0856 Last data filed at 01/26/15 0440  Gross per 24 hour  Intake    240 ml  Output   1400 ml  Net  -1160 ml    LABS: Basic Metabolic Panel:  Recent Labs  01/24/15 0300  NA 141  K 3.2*  CL 98*  CO2 32  GLUCOSE 95  BUN 57*  CREATININE 1.87*  CALCIUM 8.3*   Liver Function Tests: No results for input(s): AST, ALT, ALKPHOS, BILITOT, PROT, ALBUMIN in the last 72 hours. No results for input(s): LIPASE, AMYLASE in the last 72 hours. CBC:  Recent Labs  01/24/15 0300  WBC 9.7  HGB 10.1*  HCT 32.3*  MCV 86.8  PLT 186   Cardiac Enzymes: No results for input(s): CKTOTAL, CKMB, CKMBINDEX, TROPONINI in the last 72 hours. BNP: Invalid input(s): POCBNP D-Dimer: No results for input(s): DDIMER in the last 72 hours. Hemoglobin A1C: No results for input(s): HGBA1C in the last 72 hours. Fasting Lipid  Panel: No results for input(s): CHOL, HDL, LDLCALC, TRIG, CHOLHDL, LDLDIRECT in the last 72 hours. Thyroid Function Tests: No results for input(s): TSH, T4TOTAL, T3FREE, THYROIDAB in the last 72 hours.  Invalid input(s): FREET3 Anemia Panel: No results for input(s): VITAMINB12, FOLATE, FERRITIN, TIBC, IRON, RETICCTPCT in the last 72 hours.  RADIOLOGY: Dg Chest 2 View  01/23/2015   CLINICAL DATA:  Pulmonary edema.  Congestive heart failure.  EXAM: CHEST  2 VIEW  COMPARISON:  01/22/2015 and 01/20/2015  FINDINGS: PICC tip is in the superior vena cava in good position. Chronic cardiomegaly. Pulmonary vascularity is slightly increased since yesterday but the pleural effusions have slightly diminished.  IMPRESSION: Persistent congestive heart failure with increased vascular congestion since yesterday.   Electronically Signed   By: Lorriane Shire M.D.   On:  01/23/2015 07:40   Dg Chest 2 View  01/20/2015   CLINICAL DATA:  severe sob, has been saturating anywhere from the 70's to the low 90"s, is on O2 in the home, has a mask on tonight, hx chf, htn, copd, is getting lasix as soon as she gets back upstairs  EXAM: CHEST - 2 VIEW  COMPARISON:  11/06/2014  FINDINGS: Increasing pleural effusions, moderate right and small left. Stable cardiomegaly. Worsening bilateral interstitial and airspace edema or infiltrates involving bases more than apices. New consolidation/ atelectasis in basilar segments of both lower lobes. No pneumothorax.  Atheromatous aorta. Visualized skeletal structures are unremarkable.  IMPRESSION: 1. Worsening bilateral edema/infiltrates and asymmetric pleural effusions. 2. Stable cardiomegaly   Electronically Signed   By: Lucrezia Europe M.D.   On: 01/20/2015 21:35   Dg Chest Port 1 View  01/22/2015   CLINICAL DATA:  69 year old female with a history of line placement  EXAM: PORTABLE CHEST - 1 VIEW  COMPARISON:  01/20/2015  FINDINGS: Cardiomediastinal silhouette unchanged, partially obscured by overlying lung/pleural disease.  Interstitial and airspace opacities, greater at the right base. Blunting the cardiophrenic angles and costophrenic angles bilaterally.  No pneumothorax.  Interval placement right upper extremity PICC which appears to terminate at the superior vena cava. Atherosclerotic calcifications of the aortic arch.  IMPRESSION: Bilateral interstitial and airspace opacities persist, with likely right greater than left pleural effusions.  Interval placement of right upper extremity PICC, with the tip appearing to terminate in the superior vena cava.  Signed,  Dulcy Fanny. Earleen Newport, DO  [facility]  [facility]   Electronically Signed   By: Corrie Mckusick D.O.   On: 01/22/2015 12:17    PHYSICAL EXAM General: NAD Neck: JVP not elevated, no thyromegaly or thyroid nodule.  Lungs: clear with decreased BS  throughout. CV: Nondisplaced PMI.  Heart mildly tachy, irregular S1/S2, no S3/S4, no murmur.  Trace ankle edema.   Abdomen: Soft, nontender, no hepatosplenomegaly, no distention.  Neurologic: Alert and oriented x 3.  Psych: Normal affect. Extremities: No clubbing or cyanosis.   TELEMETRY: Reviewed telemetry pt in afib in 80s-100s  ASSESSMENT AND PLAN: 1. Atrial fibrillation: Chronic x years. She would be unlikely to remain in NSR if cardioversion were attempted. Plan rate control. CHADSVASC = 3 at least. No history of GI bleeding. Restart Eliquis now that she has had RHC.    2. Pulmonary arterial HTN/cor pulmonale: Significant right heart failure. Echo in 4/16 showed moderately dilated and mildly dysfunctional RV and PA systolic pressure 73 mmHg mmHg. PA systolic pressure 82/80 on RHC in 6/15 with normal PCWP. She had minimal improvement with Tyvaso (nearly identical RHC on  and off Tyvaso) and actually felt considerably better off Tyvaso. Patient has a group 3 component to her PAH (OHS/OSA and COPD), but suspect a group 1 contribution that could be related to rheumatological disease (rheumatoid arthritis). She has had some improvement with Opsumit and recently started Adcirca.Today, PA pressure improved at 44/19 with PCWP 17 (primarily pulmonary venous hypertension now).  She has diuresed well, back on po diuretics. - Continue oxygen supplementation.  - Continue Opsumit 10 daily and Adcirca 20 daily.Keep Adcirca at 20 for now with soft BP.    3. CKD: Needs BMET today.   4. RA: Being treated (Dr. Amil Amen). Continues to be limited by joint pain. Will increase prednisone to 38m x 3 days today is day 3/3.  Home on prior prednisone dose.  5. CHF: Primarily RV failure, as above.  6. OHS/OSA: Actually had sleep study without significant OSA so is now off CPAP.  7. Acute on chronic respiratory failure with PAH & COPD: On home oxygen.P/CCM seeing - has now been changed to  doxycycline for AECOPD.   8. Disposition: May go home today. Needs home health and followup with me in CHF clinic in 1 week.  Home cardiac meds: torsemide 80 qam/40 qpm, metolazone 2.5 mg once weekly on Wednesday, Adcirca 20 daily, Opsumit 10 daily, prednisone 5 daily, bisoprolol 10 daily, doxycycline 100 mg bid to complete 7 day course, apixaban 5 bid, KCl 40 bid, diabetes meds and inhalers as prior to admission.   DLoralie ChampagneMD 01/26/2015 8:56 AM

## 2015-01-26 NOTE — Progress Notes (Signed)
1110-1130 Offered to walk but pt not up to it right now. Stated would like to get washed up and PICC out before walking. Gave pt CHF booklet and reviewed zones. Gave pt low sodium handouts and encouraged her to keep to 2077m. Discussed daily weights and importance of notifying MD if 3 pounds overnight or 5 in week. Also discussed 2L fluid restriction. Pt stated she has home oxygen to goes 6 to 8 L. Will return to walk later at pt's request. CGraylon GoodRN BSN 01/26/2015 11:27 AM

## 2015-01-26 NOTE — Interval H&P Note (Signed)
History and Physical Interval Note:  01/26/2015 8:10 AM  Tracey Duncan  has presented today for surgery, with the diagnosis of HF  The various methods of treatment have been discussed with the patient and family. After consideration of risks, benefits and other options for treatment, the patient has consented to  Procedure(s): Right Heart Cath (N/A) as a surgical intervention .  The patient's history has been reviewed, patient examined, no change in status, stable for surgery.  I have reviewed the patient's chart and labs.  Questions were answered to the patient's satisfaction.     Layna Roeper [employer]

## 2015-01-26 NOTE — Progress Notes (Signed)
CARDIAC REHAB PHASE I   PRE:  Rate/Rhythm: 87 afib  BP:  Supine:   Sitting: 103/49  Standing:    SaO2: 94% 6L  MODE:  Ambulation: 170 ft   POST:  Rate/Rhythm: 99-106 afib  BP:  Supine:   Sitting: 115/62  Standing:    SaO2: 85-88% 6L 1415-1435 Pt walked 170 ft on 6L with rollator and asst x 2. Pt has rollator at home. Pt denied SOB with sats 85 to 88%. Pt stated that distance more limited by rheumatoid arthritis and gout than SOB. To recliner after walk. Call bell in reach.   Graylon Good, RN BSN  01/26/2015 2:30 PM

## 2015-01-26 NOTE — Progress Notes (Signed)
Home medication (opsumit) picked up from pharmacy and returned to patient

## 2015-01-26 NOTE — Progress Notes (Signed)
Discharge instructions reviewed with patient. Patient verbalized understanding.

## 2015-01-26 NOTE — Discharge Summary (Signed)
Discharge Summary   Patient ID: Tracey Duncan,  MRN: [id number], DOB/AGE: 1946/02/06 69 y.o.  Admit date: 01/20/2015 Discharge date: 01/26/2015  Primary Care Provider: Gwendolyn Grant Primary Cardiologist: Dr. Aundra Dubin  Discharge Diagnoses Principal Problem:   Acute on chronic diastolic CHF (congestive heart failure) Active Problems:   COPD GOLD II    OSA   Atrial fibrillation, chronic   HTN (hypertension)   Hypothyroidism   Pulmonary edema   Bronchitis   Allergies No Known Allergies  Procedures  Right Heart Catheterization 01/26/2015 Conclusion    1. Mildly elevated PCWP.  2. Mild pulmonary venous hypertension. 3. RHC numbers improved from prior.      Right Heart Pressures RHC Procedural Findings: Hemodynamics (mmHg) RA mean 7 RV 46/6 PA 44/19, mean 31 PCWP mean 18  Oxygen saturations: PA 62% AO 90%  Cardiac Output (Fick) 6.64  Cardiac Index (Fick) 3.46 PVR 2 WU       Hospital Course  The patient is a 69 year old female with PMH of COPD on home oxygen, OHS/OSA, rheumatoid arthritis, and chronic atrial fibrillation who was seen by Dr. Aundra Dubin in the HF office on 01/20/2015. She has a history of pulmonary hypertension noted on echocardiogram in 2014. V/Q scan was negative for chronic PE. She had a right heart cath in November 2014 which confirmed moderate to severe PAH with PVR 7.4 WU. She was initially started on Tyvaso, however changed to Opsumit as her repeat [employer] in 2015 was nearly identical to prior study in 07/2013. Adcirca was later added. During the office visit, patient complained of shortness of breath with any exertion despite increased dose of Lasix and decrease weight. She also admitted to have productive cough with green sputum however no subjective fever. It was felt she remains fluid overloaded with prominent sign of RV failure. She was also noted to be hypoxic in the office. She was admitted from office for IV diuresis.   Given her significant  COPD history, pulmonology was consulted to rule out bronchitis or pneumonia which also could explain her clinical picture. She was empirically treated for community-acquired pneumonia with antibiotic. Initial chest x-ray shows worsening bilateral edema/infiltrate and asymmetric pleural effusion. She was given an additional dose of metolazone on 5/19 to help with diuresis. PICC line was placed on the same day to monitor for CVP and co-ox. Pulmonology signed off on 5/20, recommended to complete 7 days of doxycycline. She was transitioned to low-dose torsemide on 5/22. Planned right heart catheterization was completed on 5/23 which showed cardiac index 3.46, cardiac output 6.64, RV 46/6, wedge mean pressure 18. She was seen by Dr. Aundra Dubin in the morning of 5/23, her creatinine has improved to 1.6. Given the right heart cath result, she is deemed stable for discharge from cardiology perspective. Her RV pressure improved. Eliquis will be restarted.   Of note, per pulmonology recommendation, patient will need to complete a seven-day course of doxycycline. His doxycycline was started on 5/19, I will prescribe him 5 additional dose of doxycycline to complete a total of seven-day course.  Her discharge weight is 192 lbs which is her new dry weight, her I/O - 11L since admission  Discharge Vitals Blood pressure 89/55, pulse 84, temperature 98.4 F (36.9 C), temperature source Oral, resp. rate 18, height _0  (1.626 m), weight 192 lb 0.3 oz (87.1 kg), SpO2 92 %.  Filed Weights   01/24/15 0241 01/25/15 0500 01/26/15 0446  Weight: 194 lb 7.1 oz (88.2 kg) 190 lb 14.7 oz (86.6 kg)  192 lb 0.3 oz (87.1 kg)    Labs  CBC  Recent Labs  01/24/15 0300  WBC 9.7  HGB 10.1*  HCT 32.3*  MCV 86.8  PLT 716   Basic Metabolic Panel  Recent Labs  01/24/15 0300 01/26/15 0900  NA 141 139  K 3.2* 3.7  CL 98* 101  CO2 32 28  GLUCOSE 95 118*  BUN 57* 54*  CREATININE 1.87* 1.60*  CALCIUM 8.3* 8.2*     Disposition  Pt is being discharged home today in good condition.  Follow-up Plans & Appointments      Follow-up Information    Follow up with [facility] On 02/12/2015.   Specialty:  Cardiology   Why:  2:20pm. Garage: 0800. Followup with Dr. Lavell Islam information:   [id number] [id number] [place] [phone]      Discharge Medications    Medication List    TAKE these medications        acetaminophen 500 MG tablet  Commonly known as:  TYLENOL  Take 1,000 mg by mouth daily as needed (for arthritic pain).     ADVAIR DISKUS 250-50 MCG/DOSE Aepb  Generic drug:  Fluticasone-Salmeterol  INHALE 1 PUFF INTO THE LUNGS EVERY 12 (TWELVE) HOURS.     apixaban 5 MG Tabs tablet  Commonly known as:  ELIQUIS  Take 1 tablet (5 mg total) by mouth 2 (two) times daily.     bisoprolol 10 MG tablet  Commonly known as:  ZEBETA  TAKE 1 TABLET EVERY DAY     calcium carbonate 500 MG chewable tablet  Commonly known as:  TUMS - dosed in mg elemental calcium  Chew 2 tablets by mouth daily as needed for indigestion or heartburn.     calcium-vitamin D 500-200 MG-UNIT per tablet  Commonly known as:  OSCAL WITH D  Take 1 tablet by mouth daily.     CENTRUM SILVER PO  Take 1 tablet by mouth daily.     cetirizine 10 MG tablet  Commonly known as:  ZYRTEC  Take 10 mg by mouth at bedtime.     cholecalciferol 1000 UNITS tablet  Commonly known as:  VITAMIN D  Take 1,000 Units by mouth daily.     DENTAGEL 1.1 % Gel dental gel  Generic drug:  sodium fluoride  Place 1 application onto teeth at bedtime.     doxycycline 100 MG tablet  Commonly known as:  VIBRA-TABS  Take 1 tablet (100 mg total) by mouth every 12 (twelve) hours.     febuxostat 40 MG tablet  Commonly known as:  ULORIC  Take 80 mg by mouth daily.     gabapentin 300 MG capsule  Commonly known as:  NEURONTIN  Take 300 mg by mouth 2  (two) times daily.     ipratropium-albuterol 0.5-2.5 (3) MG/3ML Soln  Commonly known as:  DUONEB  Take 3 mLs by nebulization every 4 (four) hours as needed (for wheezing or shortness of breath).     leflunomide 10 MG tablet  Commonly known as:  ARAVA  Take 20 mg by mouth daily.     levothyroxine 50 MCG tablet  Commonly known as:  SYNTHROID, LEVOTHROID  Take 1 tablet (50 mcg total) by mouth daily before breakfast.     Macitentan 10 MG Tabs  Commonly known as:  OPSUMIT  Take 10 mg by mouth daily.     Magnesium 400 MG Caps  Take  1 capsule by mouth daily.     metFORMIN 500 MG tablet  Commonly known as:  GLUCOPHAGE  Take 1 tablet (500 mg total) by mouth 2 (two) times daily with a meal.     metolazone 2.5 MG tablet  Commonly known as:  ZAROXOLYN  Take 1 tablet (2.5 mg total) by mouth once a week. WEDNESDAYS.     montelukast 10 MG tablet  Commonly known as:  SINGULAIR  Take 10 mg by mouth at bedtime.     OVER THE COUNTER MEDICATION  Apply 1 application topically daily as needed (dry mouth).     potassium chloride SA 20 MEQ tablet  Commonly known as:  K-DUR,KLOR-CON  Take 40 mEq by mouth 2 (two) times daily.     pravastatin 20 MG tablet  Commonly known as:  PRAVACHOL  TAKE 1 TABLET BY MOUTH EVERY EVENING     prednisoLONE 5 MG Tabs tablet  Take 5 mg by mouth daily.     PRESCRIPTION MEDICATION  Oxygen.   Pt uses 6-8 liters of oxygen daily.     PROAIR HFA 108 (90 BASE) MCG/ACT inhaler  Generic drug:  albuterol  Inhale 2 puffs into the lungs every 4 (four) hours as needed for wheezing or shortness of breath.     REFRESH OP  Place 1 drop into both eyes daily as needed (for dry eyes).     sertraline 50 MG tablet  Commonly known as:  ZOLOFT  TAKE 1 TABLET BY MOUTH EVERY DAY     SPIRIVA HANDIHALER 18 MCG inhalation capsule  Generic drug:  tiotropium  INHALE 1 CAPSULE VIA HANDIHALER ONCE DAILY AT THE SAME TIME EVERY DAY     Tadalafil (PAH) 20 MG Tabs  Commonly known  as:  ADCIRCA  Take 1 tablet (20 mg total) by mouth daily.     torsemide 20 MG tablet  Commonly known as:  DEMADEX  Take 50m (4 tablets) in am and 412m(2 tablets) in pm.     traMADol 50 MG tablet  Commonly known as:  ULTRAM  Take 100 mg by mouth as needed for moderate pain.        Duration of Discharge Encounter   Greater than 30 minutes including physician time.  SiHilbert CorriganA-C Pager: [phone]/23/2016, 2:25 PM

## 2015-01-26 NOTE — H&P (View-Only) (Signed)
Patient ID: Tracey Duncan, female   DOB: Aug 16, 1946, 69 y.o.   MRN: [id number]   SUBJECTIVE:   Feels much better. Diuresed well on metolazone which was stopped yesterday. CVP 5. Denies dyspnea.   Arthritis improved on high-dose prednisone. BMET pending.   Cr 1.77->1.87-> pending  Scheduled Meds: . antiseptic oral rinse  7 mL Mouth Rinse BID  . arformoterol  15 mcg Nebulization BID  . bisoprolol  10 mg Oral Daily  . budesonide (PULMICORT) nebulizer solution  0.5 mg Nebulization BID  . doxycycline  100 mg Oral Q12H  . febuxostat  80 mg Oral Daily  . gabapentin  300 mg Oral BID  . insulin aspart  0-15 Units Subcutaneous TID WC  . insulin aspart  0-5 Units Subcutaneous QHS  . leflunomide  20 mg Oral Daily  . levothyroxine  50 mcg Oral QAC breakfast  . Macitentan  10 mg Oral Daily  . magnesium oxide  400 mg Oral Daily  . oxymetazoline  2 spray Each Nare BID  . potassium chloride SA  40 mEq Oral BID  . pravastatin  20 mg Oral QPM  . predniSONE  40 mg Oral Q breakfast  . sertraline  50 mg Oral Daily  . sodium chloride  3 mL Intravenous Q12H  . Tadalafil (PAH)  20 mg Oral Daily  . tiotropium  18 mcg Inhalation Daily   Continuous Infusions:  PRN Meds:.sodium chloride, acetaminophen, calcium carbonate, colchicine, ondansetron (ZOFRAN) IV, sodium chloride, sodium chloride, traMADol, zolpidem    Filed Vitals:   01/25/15 0500 01/25/15 0819 01/25/15 0902 01/25/15 1000  BP:  89/52    Pulse:  105  109  Temp:  99 F (37.2 C)    TempSrc:  Oral    Resp:  18    Height:      Weight: 86.6 kg (190 lb 14.7 oz)     SpO2:  87% 93% 96%    Intake/Output Summary (Last 24 hours) at 01/25/15 1201 Last data filed at 01/25/15 1000  Gross per 24 hour  Intake    540 ml  Output   1925 ml  Net  -1385 ml    LABS: Basic Metabolic Panel:  Recent Labs  01/23/15 0315 01/24/15 0300  NA 140 141  K 3.6 3.2*  CL 99* 98*  CO2 30 32  GLUCOSE 118* 95  BUN 52* 57*  CREATININE 1.77* 1.87*    CALCIUM 8.4* 8.3*   Liver Function Tests: No results for input(s): AST, ALT, ALKPHOS, BILITOT, PROT, ALBUMIN in the last 72 hours. No results for input(s): LIPASE, AMYLASE in the last 72 hours. CBC:  Recent Labs  01/23/15 0315 01/24/15 0300  WBC 9.5 9.7  HGB 9.8* 10.1*  HCT 31.5* 32.3*  MCV 87.5 86.8  PLT 207 186   Cardiac Enzymes: No results for input(s): CKTOTAL, CKMB, CKMBINDEX, TROPONINI in the last 72 hours. BNP: Invalid input(s): POCBNP D-Dimer: No results for input(s): DDIMER in the last 72 hours. Hemoglobin A1C: No results for input(s): HGBA1C in the last 72 hours. Fasting Lipid Panel: No results for input(s): CHOL, HDL, LDLCALC, TRIG, CHOLHDL, LDLDIRECT in the last 72 hours. Thyroid Function Tests: No results for input(s): TSH, T4TOTAL, T3FREE, THYROIDAB in the last 72 hours.  Invalid input(s): FREET3 Anemia Panel: No results for input(s): VITAMINB12, FOLATE, FERRITIN, TIBC, IRON, RETICCTPCT in the last 72 hours.  RADIOLOGY: Dg Chest 2 View  01/23/2015   CLINICAL DATA:  Pulmonary edema.  Congestive heart failure.  EXAM: CHEST  2 VIEW  COMPARISON:  01/22/2015 and 01/20/2015  FINDINGS: PICC tip is in the superior vena cava in good position. Chronic cardiomegaly. Pulmonary vascularity is slightly increased since yesterday but the pleural effusions have slightly diminished.  IMPRESSION: Persistent congestive heart failure with increased vascular congestion since yesterday.   Electronically Signed   By: Lorriane Shire M.D.   On: 01/23/2015 07:40   Dg Chest 2 View  01/20/2015   CLINICAL DATA:  severe sob, has been saturating anywhere from the 70's to the low 90"s, is on O2 in the home, has a mask on tonight, hx chf, htn, copd, is getting lasix as soon as she gets back upstairs  EXAM: CHEST - 2 VIEW  COMPARISON:  11/06/2014  FINDINGS: Increasing pleural effusions, moderate right and small left. Stable cardiomegaly. Worsening bilateral interstitial and airspace edema or  infiltrates involving bases more than apices. New consolidation/ atelectasis in basilar segments of both lower lobes. No pneumothorax.  Atheromatous aorta. Visualized skeletal structures are unremarkable.  IMPRESSION: 1. Worsening bilateral edema/infiltrates and asymmetric pleural effusions. 2. Stable cardiomegaly   Electronically Signed   By: Lucrezia Europe M.D.   On: 01/20/2015 21:35   Dg Chest Port 1 View  01/22/2015   CLINICAL DATA:  69 year old female with a history of line placement  EXAM: PORTABLE CHEST - 1 VIEW  COMPARISON:  01/20/2015  FINDINGS: Cardiomediastinal silhouette unchanged, partially obscured by overlying lung/pleural disease.  Interstitial and airspace opacities, greater at the right base. Blunting the cardiophrenic angles and costophrenic angles bilaterally.  No pneumothorax.  Interval placement right upper extremity PICC which appears to terminate at the superior vena cava. Atherosclerotic calcifications of the aortic arch.  IMPRESSION: Bilateral interstitial and airspace opacities persist, with likely right greater than left pleural effusions.  Interval placement of right upper extremity PICC, with the tip appearing to terminate in the superior vena cava.  Signed,  Dulcy Fanny. Earleen Newport, DO  [facility]  [facility]   Electronically Signed   By: Corrie Mckusick D.O.   On: 01/22/2015 12:17    PHYSICAL EXAM General: NAD Neck: JVP flat, no thyromegaly or thyroid nodule.  Lungs: clear with decreased BS throughout. CV: Nondisplaced PMI.  Heart mildly tachy, irregular S1/S2, no S3/S4, no murmur.  Trace ankle edema.   Abdomen: Soft, nontender, no hepatosplenomegaly, no distention.  Neurologic: Alert and oriented x 3.  Psych: Normal affect. Extremities: No clubbing or cyanosis.   TELEMETRY: Reviewed telemetry pt in afib in 80s-100s  ASSESSMENT AND PLAN: 1. Atrial fibrillation: Chronic x years. She would be unlikely to remain in NSR if  cardioversion were attempted. Plan rate control. CHADSVASC = 3 at least. No history of GI bleeding. Continue apixaban and bisoprolol.  2. Pulmonary arterial HTN/cor pulmonale: Significant right heart failure. Echo in 4/16 showed moderately dilated and mildly dysfunctional RV and PA systolic pressure 73 mmHg mmHg. PA systolic pressure 85/88 on RHC in 6/15 with normal PCWP. She had minimal improvement with Tyvaso (nearly identical RHC on and off Tyvaso) and actually felt considerably better off Tyvaso. Patient has a group 3 component to her PAH (OHS/OSA and COPD), but suspect a group 1 contribution that could be related to rheumatological disease (rheumatoid arthritis). She has had some improvement with Opsumit and recently started Adcirca. She remains volume overloaded with prominent signs of RV failure, NYHA class IV symptoms. Co-ox 59.5%.  - Continue oxygen supplementation.  - Continue Opsumit 10 daily and Adcirca 20 daily. Can increase to 40  daily as BP toelrates.  - CVP now 4-5.Will restart low-dose torsemide today. RHC tomorrow with Dr. Aundra Dubin .Marland Kitchen   3. CKD: Creatinine stable  4. RA: Being treated (Dr. Amil Amen). Continues to be limited by joint pain. Will increase prednisone to 66m x 3 days today is day 2/3. 5. CHF: Primarily RV failure, as above.  6. OHS/OSA: Actually had sleep study without significant OSA so is now off CPAP.  7. Acute on chronic respiratory failure with PAH & COPD: On home oxygen.P/CCM seeing - has now been changed to doxycycline with prednisone back to home dose.    BGlori BickersMD 01/25/2015 12:01 PM

## 2015-01-26 NOTE — Progress Notes (Signed)
PT Cancellation Note  Patient Details Name: Tracey Duncan MRN: [id number] DOB: [birth year]   Cancelled Treatment:    Reason Eval/Treat Not Completed: Other (comment) (pt just ambulated with cardiac rehab and d/c today)   Denice Paradise 01/26/2015, 3:23 PM Bright Spielmann,PT Acute Rehabilitation [phone] (pager)

## 2015-01-27 ENCOUNTER — Telehealth: Payer: Self-pay | Admitting: *Deleted

## 2015-01-27 LAB — GLUCOSE, CAPILLARY: Glucose-Capillary: 115 mg/dL — ABNORMAL HIGH (ref 65–99)

## 2015-01-27 MED FILL — Heparin Sodium (Porcine) 2 Unit/ML in Sodium Chloride 0.9%: INTRAMUSCULAR | Qty: 1000 | Status: AC

## 2015-01-27 MED FILL — Lidocaine HCl Local Preservative Free (PF) Inj 1%: INTRAMUSCULAR | Qty: 30 | Status: AC

## 2015-01-27 NOTE — Telephone Encounter (Signed)
Pt was on tcm list d/c 01/26/15 had (R) Heart catherization. Will f/u with cardiology 02/12/15...Johny Chess

## 2015-02-12 ENCOUNTER — Ambulatory Visit (HOSPITAL_COMMUNITY)
Admit: 2015-02-12 | Discharge: 2015-02-12 | Disposition: A | Discharge status: Home / Self Care | Payer: Medicare Other | Source: Ambulatory Visit | Attending: Internal Medicine | Admitting: Internal Medicine

## 2015-02-12 ENCOUNTER — Encounter (HOSPITAL_COMMUNITY): Payer: Self-pay

## 2015-02-12 ENCOUNTER — Other Ambulatory Visit (HOSPITAL_COMMUNITY): Payer: Self-pay | Admitting: Cardiology

## 2015-02-12 VITALS — BP 100/58 | HR 85 | Wt 197.2 lb

## 2015-02-12 DIAGNOSIS — I129 Hypertensive chronic kidney disease with stage 1 through stage 4 chronic kidney disease, or unspecified chronic kidney disease: Secondary | ICD-10-CM | POA: Insufficient documentation

## 2015-02-12 DIAGNOSIS — G4733 Obstructive sleep apnea (adult) (pediatric): Secondary | ICD-10-CM | POA: Insufficient documentation

## 2015-02-12 DIAGNOSIS — I272 Other secondary pulmonary hypertension: Secondary | ICD-10-CM | POA: Insufficient documentation

## 2015-02-12 DIAGNOSIS — E669 Obesity, unspecified: Secondary | ICD-10-CM | POA: Diagnosis not present

## 2015-02-12 DIAGNOSIS — M069 Rheumatoid arthritis, unspecified: Secondary | ICD-10-CM | POA: Diagnosis not present

## 2015-02-12 DIAGNOSIS — Z79899 Other long term (current) drug therapy: Secondary | ICD-10-CM | POA: Insufficient documentation

## 2015-02-12 DIAGNOSIS — N189 Chronic kidney disease, unspecified: Secondary | ICD-10-CM | POA: Diagnosis not present

## 2015-02-12 DIAGNOSIS — E039 Hypothyroidism, unspecified: Secondary | ICD-10-CM | POA: Diagnosis not present

## 2015-02-12 DIAGNOSIS — Z8249 Family history of ischemic heart disease and other diseases of the circulatory system: Secondary | ICD-10-CM | POA: Insufficient documentation

## 2015-02-12 DIAGNOSIS — I2781 Cor pulmonale (chronic): Secondary | ICD-10-CM | POA: Insufficient documentation

## 2015-02-12 DIAGNOSIS — Z9981 Dependence on supplemental oxygen: Secondary | ICD-10-CM | POA: Diagnosis not present

## 2015-02-12 DIAGNOSIS — I5032 Chronic diastolic (congestive) heart failure: Secondary | ICD-10-CM

## 2015-02-12 DIAGNOSIS — E785 Hyperlipidemia, unspecified: Secondary | ICD-10-CM | POA: Diagnosis not present

## 2015-02-12 DIAGNOSIS — I270 Primary pulmonary hypertension: Secondary | ICD-10-CM | POA: Diagnosis not present

## 2015-02-12 DIAGNOSIS — Z87891 Personal history of nicotine dependence: Secondary | ICD-10-CM | POA: Insufficient documentation

## 2015-02-12 DIAGNOSIS — I482 Chronic atrial fibrillation, unspecified: Secondary | ICD-10-CM

## 2015-02-12 DIAGNOSIS — J449 Chronic obstructive pulmonary disease, unspecified: Secondary | ICD-10-CM | POA: Diagnosis not present

## 2015-02-12 DIAGNOSIS — Z7902 Long term (current) use of antithrombotics/antiplatelets: Secondary | ICD-10-CM | POA: Diagnosis not present

## 2015-02-12 LAB — BASIC METABOLIC PANEL
Anion gap: 12 (ref 5–15)
BUN: 36 mg/dL — ABNORMAL HIGH (ref 6–20)
CHLORIDE: 97 mmol/L — AB (ref 101–111)
CO2: 30 mmol/L (ref 22–32)
CREATININE: 1.79 mg/dL — AB (ref 0.44–1.00)
Calcium: 8.6 mg/dL — ABNORMAL LOW (ref 8.9–10.3)
GFR calc Af Amer: 33 mL/min — ABNORMAL LOW (ref >60)
GFR, EST NON AFRICAN AMERICAN: 28 mL/min — AB (ref >60)
GLUCOSE: 129 mg/dL — AB (ref 65–99)
Potassium: 3.8 mmol/L (ref 3.5–5.1)
SODIUM: 139 mmol/L (ref 135–145)

## 2015-02-12 NOTE — Progress Notes (Signed)
Patient ID: Tracey Duncan, female   DOB: [birth year], 69 y.o.   MRN: [id number] PCP: Garnette Gunner  69 yo with history of COPD on home oxygen, OSA (? OHS/OSA), rheumatoid arthritis, and chronic atrial fibrillation presents for cardiology followup.  She was diagnosed with atrial fibrillation around 2004. She failed cardioversion and it has been chronic.  She has noted exertional dyspnea since at least 2012.  She was diagnosed with COPD back in 2002.  She no longer smokes.  She had been on CPAP for OSA prior, but was started on home oxygen all the time by Dr. Lamonte Sakai.  No tachypalpitations, no chest pain.  Echo in 2/14 showed a moderately dilated RV with moderately decreased systolic function, PA systolic pressure 65 mmHg. She was started on apixaban.   V/Q scan was negative for chronic PE.  She has been diagnosed with rheumatoid arthritis and is on [name].  She had a right heart cath in 11/14 that confirmed moderate to severe PAH with PVR 7.4 WU.  In 12/14, was started on Tyvaso. She wwas changed to Opsumit when [place] in 2015 was nearly identical to previous study in 07/2013 Adcirca also added.   During a HF office visit 01/20/15, she c/o DOE with minimal activity despite increased dose of Lasix and decrease weight. Also had productive cough with green sputum, no fever. She was noted to be hypoxic and fluid overloaded and was admitted on for IV diuresis. Pulm consult to r/o bronchitis or pneumonia and was placed on CAP empiric coverage. CXR showed bilateral edema/infiltrate and asymmetric pleural effusion. She was given an additional dose of metolazone on 5/19 to help with diuresis and was transitioned to her home meds.  PICC line placed 5/19. RHC on 5/23 showed CI 3.46, CO 6.64, RV 46/6, wedge mean pressure 18. Was deemed fit for discharge 5/23 and instructed to finish 7 day course of doxycycline.   She presents to the clinic today for post hospital follow up. Mild dyspnea with exertion. She remains on 6 liters [place] and with  exertion 8 liters. Has had a couple of episodes of dizziness. Weight at home 190-193 pounds. Tries to drink  < 2 liters . Had frozen chicken pot pie last night. Takes all medications. No bleeding problems. Has follow up with pulmonary and rheumatology in July.   Labs (4/14): RF < 10, ANA negative, BNP 294, K 3.8, creatinine 1.7  Labs (9/14): K 4.5, creatinine 1.74 Labs (11/14): K 4.5=>3.9, creatinine 1.6, BNP 300 Labs (2/15): K 4.3, creatinine 1.6=>1.5, BNP 383=>311, LDL 72, HDL 49 Labs (3/15): K 4.2, creatinine 1.5 Labs (5/16) K 3.7, creatinine 1.6 Labs 02/12/2015: K 3.8 Creatinine 1.79   PMH: 1. OSA/OHS: On CPAP at night and oxygen during the day. 2. COPD: Diagnosed 2002.  FEV1 65% predicted in 4/14.  3. HTN 4. Chronic atrial fibrillation: Failed DCCV in 12/04.   5. 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  Echo (2/14) with EF 55-60%, moderately dilated RV with moderately decreased systolic function, PA systolic pressure 65 mmHg.  V/Q scan in 3/14 was negative for chronic PE. PFTs (4/14): FEV1 65% predicted, mild obstructive defect with severely decreased DLCO.  RHC (11/14): mean RA 8, PA 68/28 mean 45, mean PCWP 11, CI 2.29, PVR 7.2 WU. 6 minute walk (2/15) 126 meters.  6 minute walk (3/15) 118 m.  She is on Tyvaso. 6. Lexiscan Th[place]ium in 11/11 with no evidence for ischemia or infarction.  7. Impaired  fasting glucose. 8. Hypothyroidism 9. Obesity. 10. CKD 11. Rheumatoid arthritis 12. ABIs (11/14): Normal 13. Hyperlipidemia: Myalgias with Crestor.  14. Depression 15. Chronic diastolic CHF 16. RHC 01/26/2015 CI 3.46, CO 6.64, RV 46/6, wedge mean pressure 18.   SH: Widow, moved to [place] from [place] to live with sister.  Quit smoking in 1993.  Rare ETOH. Retired Optometrist.   FH: Brother with MI at 66, father with MI at 54.  ROS: All systems reviewed and negative except as per HPI.   Current Outpatient Prescriptions  Medication Sig  Dispense Refill  . acetaminophen (TYLENOL) 500 MG tablet Take 1,000 mg by mouth daily as needed (for arthritic pain).    . ADVAIR DISKUS 250-50 MCG/DOSE AEPB INHALE 1 PUFF INTO THE LUNGS EVERY 12 (TWELVE) HOURS. 60 each 5  . albuterol (PROAIR HFA) 108 (90 BASE) MCG/ACT inhaler Inhale 2 puffs into the lungs every 4 (four) hours as needed for wheezing or shortness of breath.     Marland Kitchen apixaban (ELIQUIS) 5 MG TABS tablet Take 1 tablet (5 mg total) by mouth 2 (two) times daily. 60 tablet 3  . bisoprolol (ZEBETA) 10 MG tablet TAKE 1 TABLET EVERY DAY 30 tablet 6  . calcium carbonate (TUMS - DOSED IN MG ELEMENTAL CALCIUM) 500 MG chewable tablet Chew 2 tablets by mouth daily as needed for indigestion or heartburn.     . calcium-vitamin D (OSCAL WITH D) 500-200 MG-UNIT per tablet Take 1 tablet by mouth daily.    . cetirizine (ZYRTEC) 10 MG tablet Take 10 mg by mouth at bedtime.     . cholecalciferol (VITAMIN D) 1000 UNITS tablet Take 1,000 Units by mouth daily.    . DENTAGEL 1.1 % GEL dental gel Place 1 application onto teeth at bedtime.     . febuxostat (ULORIC) 40 MG tablet Take 80 mg by mouth daily.     Marland Kitchen gabapentin (NEURONTIN) 300 MG capsule Take 300 mg by mouth 2 (two) times daily.    Marland Kitchen ipratropium-albuterol (DUONEB) 0.5-2.5 (3) MG/3ML SOLN Take 3 mLs by nebulization every 4 (four) hours as needed (for wheezing or shortness of breath).     . leflunomide (ARAVA) 10 MG tablet Take 20 mg by mouth daily.  3  . levothyroxine (SYNTHROID, LEVOTHROID) 50 MCG tablet Take 1 tablet (50 mcg total) by mouth daily before breakfast. 90 tablet 3  . Macitentan (OPSUMIT) 10 MG TABS Take 10 mg by mouth daily. 30 tablet 6  . Magnesium 400 MG CAPS Take 1 capsule by mouth daily.    . metFORMIN (GLUCOPHAGE) 500 MG tablet Take 1 tablet (500 mg total) by mouth 2 (two) times daily with a meal. 180 tablet 3  . metolazone (ZAROXOLYN) 2.5 MG tablet Take 1 tablet (2.5 mg total) by mouth once a week. WEDNESDAYS. 15 tablet 3  .  montelukast (SINGULAIR) 10 MG tablet Take 10 mg by mouth at bedtime.    . Multiple Vitamins-Minerals (CENTRUM SILVER PO) Take 1 tablet by mouth daily.    Marland Kitchen OVER THE COUNTER MEDICATION Apply 1 application topically daily as needed (dry mouth).    . Polyvinyl Alcohol-Povidone (REFRESH OP) Place 1 drop into both eyes daily as needed (for dry eyes).    . potassium chloride SA (K-DUR,KLOR-CON) 20 MEQ tablet Take 40 mEq by mouth 2 (two) times daily.    . pravastatin (PRAVACHOL) 20 MG tablet TAKE 1 TABLET BY MOUTH EVERY EVENING 30 tablet 6  . prednisoLONE 5 MG TABS tablet Take 5 mg  by mouth daily.    Marland Kitchen PRESCRIPTION MEDICATION Oxygen.   Pt uses 6-8 liters of oxygen daily.    . sertraline (ZOLOFT) 50 MG tablet TAKE 1 TABLET BY MOUTH EVERY DAY (Patient taking differently: TAKE 1 TABLET BY MOUTH every evening) 30 tablet 8  . SPIRIVA HANDIHALER 18 MCG inhalation capsule INHALE 1 CAPSULE VIA HANDIHALER ONCE DAILY AT THE SAME TIME EVERY DAY 1 capsule 3  . Tadalafil, PAH, (ADCIRCA) 20 MG TABS Take 1 tablet (20 mg total) by mouth daily. 60 tablet 6  . torsemide (DEMADEX) 20 MG tablet Take 45m (4 tablets) in am and 470m(2 tablets) in pm. 180 tablet 3  . traMADol (ULTRAM) 50 MG tablet Take 100 mg by mouth as needed for moderate pain.      No current facility-administered medications for this encounter.    BP 100/58 mmHg  Pulse 85  Wt 197 lb 4 oz (89.472 kg)  SpO2 92% General: NAD, obese.  Neck:JVP to jaw , no thyromegaly or thyroid nodule.  Lungs: Decreased breath sounds. On 4 liters continuous oxygen.   CV: Nondisplaced PMI.  Heart irregular S1/S2, no S3/S4, no murmur. R and LLE 1+ edema.  No carotid bruit.   Abdomen: Soft, nontender, no hepatosplenomegaly, no distention.  Skin: Intact without lesions or rashes.  Neurologic: Alert and oriented x 3.  Psych: Normal affect. Extremities: No clubbing or cyanosis.    Assessment/Plan: 1. Atrial fibrillation: Chronic x years.  She would be unlikely to  remain in NSR if cardioversion were attempted.  Plan rate control.  CHADSVASC = 3 at least.  No history of GI bleeding.  Continue apixaban.  Continue current dose of  bisoprolol.  2. Pulmonary arterial HTN/cor pulmonale: Significant right heart failure.  Last echo with moderately dilated and dysfunctional RV and PA systolic pressure 65 mmHg, PA systolic pressure 6816/10n RHC with normal PCWP.  PAH likely has mixed etiology: Suspect that there is a Group I PAH component from rheumatoid arthritis, but patient also has COPD and OHS/OSA.  FEV1 is 65%, so the degree of pulmonary HTN does seem out of proportion to COPD alone.  Given suspected component of Group I PAH, started her on Tyvaso (using inhaled agent to target well-ventilated lung segments to avoid worsening of V/Q mismatch with worse hypoxemia that could theoretically occur with use of systemic medication in this patient with parenchymal lung disease from COPD).    [place] better off Tyvaso.  - Continue current Lasix.  - Continue adcirca and macitentan at current dose.  -Refer to pulmonary rehab.   3. CKD: Creatinine 1.79.  BMET today.    4. RA: Being treated (Dr. BeAmil Amen  Has follow up in July. She has been having a lot of joint pain recently.  5. CHF: Primarily RV failure.   NYHA III. Volume status elevated likely in part due to high sodium intake. Continue current torsemide dose and I have asked her to take metolazone 2.5 mg for the next 2 days.  6. Depression: Improved mood with sertraline but has trouble with dry mouth.     Follow up in 3 weeks.   CLEGG,AMY NP-C  4:07 PM

## 2015-02-12 NOTE — Patient Instructions (Signed)
LABS today (bmet)  TAKE Metolazone tomorrow and Saturday.  FOLLOW UP in 2 weeks.

## 2015-02-24 ENCOUNTER — Other Ambulatory Visit: Payer: Self-pay | Admitting: Emergency Medicine

## 2015-02-26 ENCOUNTER — Other Ambulatory Visit (HOSPITAL_COMMUNITY): Payer: Self-pay | Admitting: Cardiology

## 2015-02-26 ENCOUNTER — Encounter: Payer: Self-pay | Admitting: Physician Assistant

## 2015-02-26 ENCOUNTER — Ambulatory Visit (HOSPITAL_COMMUNITY)
Admission: RE | Admit: 2015-02-26 | Discharge: 2015-02-26 | Disposition: A | Discharge status: Home / Self Care | Payer: Medicare Other | Source: Ambulatory Visit | Attending: Cardiology | Admitting: Cardiology

## 2015-02-26 ENCOUNTER — Encounter (HOSPITAL_COMMUNITY): Payer: Self-pay

## 2015-02-26 VITALS — BP 106/52 | HR 88 | Wt 191.0 lb

## 2015-02-26 DIAGNOSIS — I2781 Cor pulmonale (chronic): Secondary | ICD-10-CM | POA: Diagnosis not present

## 2015-02-26 DIAGNOSIS — I129 Hypertensive chronic kidney disease with stage 1 through stage 4 chronic kidney disease, or unspecified chronic kidney disease: Secondary | ICD-10-CM | POA: Diagnosis not present

## 2015-02-26 DIAGNOSIS — M069 Rheumatoid arthritis, unspecified: Secondary | ICD-10-CM | POA: Diagnosis not present

## 2015-02-26 DIAGNOSIS — E785 Hyperlipidemia, unspecified: Secondary | ICD-10-CM | POA: Insufficient documentation

## 2015-02-26 DIAGNOSIS — I482 Chronic atrial fibrillation, unspecified: Secondary | ICD-10-CM

## 2015-02-26 DIAGNOSIS — I272 Other secondary pulmonary hypertension: Secondary | ICD-10-CM | POA: Insufficient documentation

## 2015-02-26 DIAGNOSIS — Z87891 Personal history of nicotine dependence: Secondary | ICD-10-CM | POA: Diagnosis not present

## 2015-02-26 DIAGNOSIS — Z7901 Long term (current) use of anticoagulants: Secondary | ICD-10-CM | POA: Diagnosis not present

## 2015-02-26 DIAGNOSIS — Z79899 Other long term (current) drug therapy: Secondary | ICD-10-CM | POA: Insufficient documentation

## 2015-02-26 DIAGNOSIS — R7301 Impaired fasting glucose: Secondary | ICD-10-CM | POA: Diagnosis not present

## 2015-02-26 DIAGNOSIS — J449 Chronic obstructive pulmonary disease, unspecified: Secondary | ICD-10-CM | POA: Insufficient documentation

## 2015-02-26 DIAGNOSIS — E039 Hypothyroidism, unspecified: Secondary | ICD-10-CM | POA: Diagnosis not present

## 2015-02-26 DIAGNOSIS — I509 Heart failure, unspecified: Secondary | ICD-10-CM | POA: Diagnosis not present

## 2015-02-26 DIAGNOSIS — G4733 Obstructive sleep apnea (adult) (pediatric): Secondary | ICD-10-CM | POA: Diagnosis not present

## 2015-02-26 DIAGNOSIS — K625 Hemorrhage of anus and rectum: Secondary | ICD-10-CM | POA: Insufficient documentation

## 2015-02-26 DIAGNOSIS — Z9981 Dependence on supplemental oxygen: Secondary | ICD-10-CM | POA: Diagnosis not present

## 2015-02-26 DIAGNOSIS — R682 Dry mouth, unspecified: Secondary | ICD-10-CM | POA: Insufficient documentation

## 2015-02-26 DIAGNOSIS — F329 Major depressive disorder, single episode, unspecified: Secondary | ICD-10-CM | POA: Diagnosis not present

## 2015-02-26 DIAGNOSIS — I5032 Chronic diastolic (congestive) heart failure: Secondary | ICD-10-CM

## 2015-02-26 DIAGNOSIS — I270 Primary pulmonary hypertension: Secondary | ICD-10-CM

## 2015-02-26 DIAGNOSIS — N189 Chronic kidney disease, unspecified: Secondary | ICD-10-CM | POA: Diagnosis not present

## 2015-02-26 LAB — BASIC METABOLIC PANEL
Anion gap: 13 (ref 5–15)
BUN: 35 mg/dL — AB (ref 6–20)
CHLORIDE: 94 mmol/L — AB (ref 101–111)
CO2: 32 mmol/L (ref 22–32)
CREATININE: 1.66 mg/dL — AB (ref 0.44–1.00)
Calcium: 8.6 mg/dL — ABNORMAL LOW (ref 8.9–10.3)
GFR calc Af Amer: 36 mL/min — ABNORMAL LOW (ref >60)
GFR calc non Af Amer: 31 mL/min — ABNORMAL LOW (ref >60)
Glucose, Bld: 128 mg/dL — ABNORMAL HIGH (ref 65–99)
Potassium: 4.2 mmol/L (ref 3.5–5.1)
Sodium: 139 mmol/L (ref 135–145)

## 2015-02-26 LAB — CBC
HCT: 35.8 % — ABNORMAL LOW (ref 36.0–46.0)
Hemoglobin: 11 g/dL — ABNORMAL LOW (ref 12.0–15.0)
MCH: 26.7 pg (ref 26.0–34.0)
MCHC: 30.7 g/dL (ref 30.0–36.0)
MCV: 86.9 fL (ref 78.0–100.0)
PLATELETS: 161 10*3/uL (ref 150–400)
RBC: 4.12 MIL/uL (ref 3.87–5.11)
RDW: 17.8 % — ABNORMAL HIGH (ref 11.5–15.5)
WBC: 8.8 10*3/uL (ref 4.0–10.5)

## 2015-02-26 NOTE — Progress Notes (Signed)
Patient ID: Tracey Duncan, female   DOB: March 24, 1946, 69 y.o.   MRN: [id number] PCP: Garnette Gunner  69 yo with history of COPD on home oxygen, OSA (? OHS/OSA), rheumatoid arthritis, and chronic atrial fibrillation presents for cardiology followup.  She was diagnosed with atrial fibrillation around 2004. She failed cardioversion and it has been chronic.  She has noted exertional dyspnea since at least 2012.  She was diagnosed with COPD back in 2002.  She no longer smokes.  She had been on CPAP for OSA prior, but was started on home oxygen all the time by Dr. Lamonte Sakai.  No tachypalpitations, no chest pain.  Echo in 2/14 showed a moderately dilated RV with moderately decreased systolic function, PA systolic pressure 65 mmHg. She was started on apixaban.   V/Q scan was negative for chronic PE.  She has been diagnosed with rheumatoid arthritis and is on [name].  She had a right heart cath in 11/14 that confirmed moderate to severe PAH with PVR 7.4 WU.  In 12/14, was started on Tyvaso. She was changed to Opsumit when [facility] in 2015 was nearly identical to previous study in 07/2013.  Adcirca also added.   During a HF office visit 01/20/15, she c/o DOE with minimal activity despite increased dose of Lasix and decrease weight. Also had productive cough with green sputum, no fever. She was noted to be hypoxic and fluid overloaded and was admitted on for IV diuresis.  RHC after diuresis showed nearly normal filling pressures and PA pressure 44/19 (decreased from prior).    At last appointment, diuretic doses were increased.  Weight is down 6 lbs. She is breathing better, no dyspnea walking in house but short of breath with longer distances.  Recently on prednisone for a gout flare in her left foot.  Not very steady on her feet and using a walker but no falls.  Mild LH occasionally with standing.  She is trying to do better with sodium limitation.  Early this am, she had several episodes of bright red blood per rectum. This is new for her.    Labs (4/14): RF < 10, ANA negative, BNP 294, K 3.8, creatinine 1.7  Labs (9/14): K 4.5, creatinine 1.74 Labs (11/14): K 4.5=>3.9, creatinine 1.6, BNP 300 Labs (2/15): K 4.3, creatinine 1.6=>1.5, BNP 383=>311, LDL 72, HDL 49 Labs (3/15): K 4.2, creatinine 1.5 Labs (5/16) K 3.7, creatinine 1.6 Labs (02/12/2015): K 3.8 Creatinine 1.79   PMH: 1. OSA/OHS: On CPAP at night and oxygen during the day. 2. COPD: Diagnosed 2002.  FEV1 65% predicted in 4/14.  3. HTN 4. Chronic atrial fibrillation: Failed DCCV in 12/04.   5. 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  Echo (2/14) with EF 55-60%, moderately dilated RV with moderately decreased systolic function, PA systolic pressure 65 mmHg.  V/Q scan in 3/14 was negative for chronic PE. PFTs (4/14): FEV1 65% predicted, mild obstructive defect with severely decreased DLCO.  RHC (11/14): mean RA 8, PA 68/28 mean 45, mean PCWP 11, CI 2.29, PVR 7.2 WU. 6 minute walk (2/15) 126 meters.  6 minute walk (3/15) 118 m.  Echo (4/16) with EF 55-60%, moderately dilated RV with mildly decreased systolic function, PASP 73 mmHg.  RHC (5/16) with mean RA 7, PA 44/19 mean 31, mean PCWP 18, CI 3.66, PVR 2 WU. She did not tolerate Tyvaso.  She is currently taking Adcirca and Opsumit.   6. Lexiscan Thallium in 11/11 with no  evidence for ischemia or infarction.  7. Impaired fasting glucose. 8. Hypothyroidism 9. Obesity. 10. CKD 11. Rheumatoid arthritis 12. ABIs (11/14): Normal 13. Hyperlipidemia: Myalgias with Crestor.  14. Depression 15. Chronic diastolic CHF  SH: Widow, moved to [place] from [place] to live with sister.  Quit smoking in 1993.  Rare ETOH. Retired Optometrist.   FH: Brother with MI at 65, father with MI at 20.  ROS: All systems reviewed and negative except as per HPI.   Current Outpatient Prescriptions  Medication Sig Dispense Refill  . acetaminophen (TYLENOL) 500 MG tablet Take 1,000 mg by mouth daily as  needed (for arthritic pain).    . ADVAIR DISKUS 250-50 MCG/DOSE AEPB INHALE 1 PUFF INTO THE LUNGS EVERY 12 (TWELVE) HOURS. 60 each 5  . albuterol (PROAIR HFA) 108 (90 BASE) MCG/ACT inhaler Inhale 2 puffs into the lungs every 4 (four) hours as needed for wheezing or shortness of breath.     Marland Kitchen apixaban (ELIQUIS) 5 MG TABS tablet Take 1 tablet (5 mg total) by mouth 2 (two) times daily. 60 tablet 3  . bisoprolol (ZEBETA) 10 MG tablet TAKE 1 TABLET EVERY DAY 30 tablet 6  . calcium carbonate (TUMS - DOSED IN MG ELEMENTAL CALCIUM) 500 MG chewable tablet Chew 2 tablets by mouth daily as needed for indigestion or heartburn.     . calcium-vitamin D (OSCAL WITH D) 500-200 MG-UNIT per tablet Take 1 tablet by mouth daily.    . cetirizine (ZYRTEC) 10 MG tablet Take 10 mg by mouth at bedtime.     . cholecalciferol (VITAMIN D) 1000 UNITS tablet Take 1,000 Units by mouth daily.    . DENTAGEL 1.1 % GEL dental gel Place 1 application onto teeth at bedtime.     . febuxostat (ULORIC) 40 MG tablet Take 80 mg by mouth daily.     Marland Kitchen gabapentin (NEURONTIN) 300 MG capsule Take 300 mg by mouth 2 (two) times daily.    Marland Kitchen ipratropium-albuterol (DUONEB) 0.5-2.5 (3) MG/3ML SOLN Take 3 mLs by nebulization every 4 (four) hours as needed (for wheezing or shortness of breath).     . leflunomide (ARAVA) 10 MG tablet Take 20 mg by mouth daily.  3  . levothyroxine (SYNTHROID, LEVOTHROID) 50 MCG tablet Take 1 tablet (50 mcg total) by mouth daily before breakfast. 90 tablet 3  . Macitentan (OPSUMIT) 10 MG TABS Take 10 mg by mouth daily. 30 tablet 6  . Magnesium 400 MG CAPS Take 1 capsule by mouth daily.    . metFORMIN (GLUCOPHAGE) 500 MG tablet Take 1 tablet (500 mg total) by mouth 2 (two) times daily with a meal. 180 tablet 3  . metolazone (ZAROXOLYN) 2.5 MG tablet Take 1 tablet (2.5 mg total) by mouth once a week. WEDNESDAYS. 15 tablet 3  . metolazone (ZAROXOLYN) 2.5 MG tablet TAKE 1 TABLET (2.5 MG TOTAL) BY MOUTH DAILY. 6 tablet 0  .  montelukast (SINGULAIR) 10 MG tablet Take 10 mg by mouth at bedtime.    . montelukast (SINGULAIR) 10 MG tablet TAKE 1 TABLET BY MOUTH AT BEDTIME 30 tablet 5  . Multiple Vitamins-Minerals (CENTRUM SILVER PO) Take 1 tablet by mouth daily.    Marland Kitchen OVER THE COUNTER MEDICATION Apply 1 application topically daily as needed (dry mouth).    . Polyvinyl Alcohol-Povidone (REFRESH OP) Place 1 drop into both eyes daily as needed (for dry eyes).    . potassium chloride SA (K-DUR,KLOR-CON) 20 MEQ tablet Take 40 mEq by mouth 2 (two) times  daily.    . pravastatin (PRAVACHOL) 20 MG tablet TAKE 1 TABLET BY MOUTH EVERY EVENING 30 tablet 6  . prednisoLONE 5 MG TABS tablet Take 5 mg by mouth daily.    Marland Kitchen PRESCRIPTION MEDICATION Oxygen.   Pt uses 6-8 liters of oxygen daily.    . sertraline (ZOLOFT) 50 MG tablet TAKE 1 TABLET BY MOUTH EVERY DAY (Patient taking differently: TAKE 1 TABLET BY MOUTH every evening) 30 tablet 8  . SPIRIVA HANDIHALER 18 MCG inhalation capsule INHALE 1 CAPSULE VIA HANDIHALER ONCE DAILY AT THE SAME TIME EVERY DAY 1 capsule 3  . Tadalafil, PAH, (ADCIRCA) 20 MG TABS Take 1 tablet (20 mg total) by mouth daily. 60 tablet 6  . torsemide (DEMADEX) 20 MG tablet Take 68m (4 tablets) in am and 468m(2 tablets) in pm. 180 tablet 3  . traMADol (ULTRAM) 50 MG tablet Take 100 mg by mouth as needed for moderate pain.      No current facility-administered medications for this encounter.    BP 106/52 mmHg  Pulse 88  Wt 191 lb (86.637 kg)  SpO2 89% General: NAD, obese.  Neck:JVP 8 cm, no thyromegaly or thyroid nodule.  Lungs: Decreased breath sounds. On 4 liters continuous oxygen.   CV: Nondisplaced PMI.  Heart irregular S1/S2, no S3/S4, no murmur. Trace ankle edema.  No carotid bruit.   Abdomen: Soft, nontender, no hepatosplenomegaly, no distention.  Skin: Intact without lesions or rashes.  Neurologic: Alert and oriented x 3.  Psych: Normal affect. Extremities: No clubbing or cyanosis.     Assessment/Plan: 1. Atrial fibrillation: Chronic x years.  She would be unlikely to remain in NSR if cardioversion were attempted.  Plan rate control.  CHADSVASC = 3 at least.  No history of GI bleeding but today with BRBPR.    - Hold apixaban x 2 doses (tonight and tomorrow morning) and call back if bleeding continues. Will check CBC today.  I will refer to GI for evaluation asap.   - Continue current dose of  bisoprolol.  2. Pulmonary arterial HTN/cor pulmonale: Significant right heart failure.  Last echo in 4/16 with moderately dilated and mildly dysfunctional RV.  Most recent RHAndersonn 5/16 showed significantly improved PAH, PVR down to 2 WU.  PAH likely has mixed etiology: Suspect that there is a Group I PAH component from rheumatoid arthritis, but patient also has COPD and OHS/OSA.  FEV1 is 65%, so the degree of pulmonary HTN does seem out of proportion to COPD alone.  Given suspected component of Group I PAH, started her on Tyvaso but she did not tolerate this.  Currently on Adcirca and Opsumit with good RHC results in 5/16 as above.  - Continue Adcirca and Opsumit at current doses.  Have room to increase Adcirca in the future if needed.    3. CKD: Creatinine 1.79 02/12/15.  BMET today.    4. RA: Being treated (Dr. BeAmil Amen  Has follow up in July. She has been having a lot of joint pain recently.  5. CHF: Primarily RV failure.  NYHA III.  Symptoms and volume status improved since increasing diuretics.  - Continue current torsemide and metolazone once weekly.  BMET today.  6. Depression: Improved mood with sertraline but has trouble with dry mouth.  7. Rectal bleeding: BP ok.  Will check CBC today.  Hold next 2 doses of Eliquis, call back if bleeding persists.  Will arrange for ASAP appt with GI.    Follow up in 1 month.  [name] 02/26/2015

## 2015-02-26 NOTE — Patient Instructions (Signed)
Routine lab work today. Will notify you of abnormal results, otherwise no news is good news!  Will refer you to [facility] for rectal bleeding. Address: [address], [place], [place] [address]  [phone]  STOP Eliquis today and tomorrow. Resume Saturday if bleeding has stopped. If bleeding has not stopped by Saturday, call our on-call provider [phone].  Follow up 1 month.  Do the following things EVERYDAY: 1) Weigh yourself in the morning before breakfast. Write it down and keep it in a log. 2) Take your medicines as prescribed 3) Eat low salt foods-Limit salt (sodium) to 2000 mg per day.  4) Stay as active as you can everyday 5) Limit all fluids for the day to less than 2 liters

## 2015-03-04 ENCOUNTER — Other Ambulatory Visit (INDEPENDENT_AMBULATORY_CARE_PROVIDER_SITE_OTHER): Payer: Medicare Other

## 2015-03-04 ENCOUNTER — Ambulatory Visit (INDEPENDENT_AMBULATORY_CARE_PROVIDER_SITE_OTHER): Payer: Medicare Other | Admitting: Internal Medicine

## 2015-03-04 ENCOUNTER — Encounter: Payer: Self-pay | Admitting: Internal Medicine

## 2015-03-04 VITALS — BP 110/60 | HR 87 | Temp 97.5°F | Resp 18 | Ht 64.0 in | Wt 195.0 lb

## 2015-03-04 DIAGNOSIS — E039 Hypothyroidism, unspecified: Secondary | ICD-10-CM

## 2015-03-04 DIAGNOSIS — Z0000 Encounter for general adult medical examination without abnormal findings: Secondary | ICD-10-CM | POA: Diagnosis not present

## 2015-03-04 DIAGNOSIS — R7309 Other abnormal glucose: Secondary | ICD-10-CM

## 2015-03-04 DIAGNOSIS — Z1159 Encounter for screening for other viral diseases: Secondary | ICD-10-CM | POA: Diagnosis not present

## 2015-03-04 DIAGNOSIS — I10 Essential (primary) hypertension: Secondary | ICD-10-CM

## 2015-03-04 DIAGNOSIS — R7303 Prediabetes: Secondary | ICD-10-CM

## 2015-03-04 LAB — MICROALBUMIN / CREATININE URINE RATIO
CREATININE, U: 29.5 mg/dL
Microalb Creat Ratio: 2.4 mg/g (ref 0.0–30.0)
Microalb, Ur: <0.7 mg/dL (ref 0.0–1.9)

## 2015-03-04 LAB — HEMOGLOBIN A1C: Hgb A1c MFr Bld: 5.8 % (ref 4.6–6.5)

## 2015-03-04 NOTE — Assessment & Plan Note (Signed)
check TSH annually and titrate replacement as needed Lab Results  Component Value Date   TSH 3.72 08/20/2014

## 2015-03-04 NOTE — Progress Notes (Signed)
Pre visit review using our clinic review tool, if applicable. No additional management support is needed unless otherwise documented below in the visit note.

## 2015-03-04 NOTE — Patient Instructions (Signed)
It was good to see you today.  We have reviewed your prior records including labs and tests today  Test(s) ordered today. Your results will be released to [place] (or called to you) after review, usually within 72hours after test completion. If any changes need to be made, you will be notified at that same time.  Medications reviewed and updated, no changes recommended at this time. Refill on medication(s) as discussed today.  we'll make referral to podiatry for foot check. Our office will contact you regarding appointment(s) once made.  Continue working with your other specialists as ongoing and as reviewed today  Please schedule followup in 6 months, call sooner if problems.

## 2015-03-04 NOTE — Progress Notes (Signed)
Subjective:    Patient ID: Tracey Duncan, female    DOB: 1945/10/11, 69 y.o.   MRN: [id number]  HPI   Here for medicare wellness  Diet: heart healthy, diabetic Physical activity: sedentary Depression/mood screen: negative Hearing: intact to whispered voice Visual acuity: grossly normal, performs annual eye exam  ADLs: capable Fall risk: none Home safety: good Cognitive evaluation: intact to orientation, naming, recall and repetition EOL planning: adv directives, full code/ I agree  I have personally reviewed and have noted 1. The patient's medical and social history 2. Their use of alcohol, tobacco or illicit drugs 3. Their current medications and supplements 4. The patient's functional ability including ADL's, fall risks, home safety risks and hearing or visual impairment. 5. Diet and physical activities 6. Evidence for depression or mood disorders  Also reviewed chronic conditions, interval events and current concerns  Past Medical History  Diagnosis Date  . Hypertension   . Atrial fibrillation     failed cardioversion, on anticoag  . OSA on CPAP   . Asthma   . Hyperlipidemia   . COPD (chronic obstructive pulmonary disease)     with pulm HTN - on 6-8LPM O2  . Chronic diastolic CHF (congestive heart failure)   . Cor pulmonale   . Obesity hypoventilation syndrome   . Diabetes type 2, controlled   . Pulmonary arterial hypertension   . Secondary PAH   . Unspecified hypothyroidism   . RLS (restless legs syndrome) 08/20/2014    Noted 07/2014 sleep study   Family History  Problem Relation Age of Onset  . Emphysema Mother   . Allergies Mother   . Arthritis Mother   . Heart disease Father   . Heart disease Brother   . Tongue cancer Paternal Grandmother   . Cancer Cousin     Ovarian Cancer  . Cancer Cousin     Breast Cancer   History  Substance Use Topics  . Smoking status: Former Smoker -- 1.50 packs/day for 28 years    Types: Cigarettes    Quit date:  09/06/1991  . Smokeless tobacco: Never Used  . Alcohol Use: No     Comment: "very seldom" 09/26/12    Review of Systems  Constitutional: Negative for fever, fatigue and unexpected weight change.  HENT: Positive for congestion (L>R ear) and sinus pressure (chronic). Negative for sneezing and sore throat.   Respiratory: Negative for cough, shortness of breath and wheezing.   Cardiovascular: Negative for chest pain, palpitations and leg swelling.  Gastrointestinal: Negative for nausea, abdominal pain and diarrhea.  Skin:       occ and recurrent perioral rash, improves with vasoline - none for several weeks  Neurological: Negative for dizziness, weakness, light-headedness and headaches.  Psychiatric/Behavioral: Negative for dysphoric mood. The patient is not nervous/anxious.   All other systems reviewed and are negative.   Patient Care Team: Rowe Clack, MD as PCP - General (Internal Medicine) Leigh Aurora, MD (Rheumatology) Collene Gobble, MD (Pulmonary Disease) Larey Dresser, MD (Cardiology) Ralene Bathe, MD (Ophthalmology) Jola Schmidt, MD (Ophthalmology)     Objective:    Physical Exam  Constitutional: She appears well-developed and well-nourished. No distress.  She is obese, wearing O2 - sister at side  Cardiovascular: Normal rate, regular rhythm and normal heart sounds.   No murmur heard. Pulmonary/Chest: Effort normal and breath sounds normal. No respiratory distress.  Musculoskeletal: She exhibits no edema.    BP 110/60 mmHg  Pulse 87  Temp(Src) 97.5 F (  36.4 C) (Oral)  Resp 18  Ht _0  (1.626 m)  Wt 195 lb (88.451 kg)  BMI 33.46 kg/m2  SpO2 94% Wt Readings from Last 3 Encounters:  03/04/15 195 lb (88.451 kg)  02/26/15 191 lb (86.637 kg)  02/12/15 197 lb 4 oz (89.472 kg)     Lab Results  Component Value Date   WBC 8.8 02/26/2015   HGB 11.0* 02/26/2015   HCT 35.8* 02/26/2015   PLT 161 02/26/2015   GLUCOSE 128* 02/26/2015   CHOL 150  10/25/2013   TRIG 118.0 10/25/2013   HDL 52.80 10/25/2013   LDLCALC 74 10/25/2013   ALT 14 01/20/2015   AST 16 01/20/2015   NA 139 02/26/2015   K 4.2 02/26/2015   CL 94* 02/26/2015   CREATININE 1.66* 02/26/2015   BUN 35* 02/26/2015   CO2 32 02/26/2015   TSH 3.72 08/20/2014   INR 1.40 01/26/2015   HGBA1C 6.0 08/20/2014    No results found.     Assessment & Plan:   AWV/z00.00 - Today patient counseled on age appropriate routine health concerns for screening and prevention, each reviewed and up to date or declined. Immunizations reviewed and up to date or declined. Labs ordered and reviewed. Risk factors for depression reviewed and negative. Hearing function and visual acuity are intact. ADLs screened and addressed as needed. Functional ability and level of safety reviewed and appropriate. Education, counseling and referrals performed based on assessed risks today. Patient provided with a copy of personalized plan for preventive services.  Problem List Items Addressed This Visit    HTN (hypertension)    BP Readings from Last 3 Encounters:  03/04/15 110/60  02/26/15 106/52  02/12/15 100/58   The current medical regimen is effective;  continue present plan and medications.       Hypothyroidism    check TSH annually and titrate replacement as needed Lab Results  Component Value Date   TSH 3.72 08/20/2014        Prediabetes - Primary   Relevant Orders   Hemoglobin A1c   Microalbumin / creatinine urine ratio   Ambulatory referral to Podiatry    Other Visit Diagnoses    Need for hepatitis C screening test        Relevant Orders    Hepatitis C antibody        Gwendolyn Grant, MD

## 2015-03-04 NOTE — Assessment & Plan Note (Signed)
BP Readings from Last 3 Encounters:  03/04/15 110/60  02/26/15 106/52  02/12/15 100/58   The current medical regimen is effective;  continue present plan and medications.

## 2015-03-05 LAB — HEPATITIS C ANTIBODY: HCV AB: NEGATIVE

## 2015-03-10 ENCOUNTER — Encounter: Payer: Self-pay | Admitting: Gastroenterology

## 2015-03-12 ENCOUNTER — Other Ambulatory Visit (INDEPENDENT_AMBULATORY_CARE_PROVIDER_SITE_OTHER): Payer: Medicare Other

## 2015-03-12 ENCOUNTER — Ambulatory Visit (INDEPENDENT_AMBULATORY_CARE_PROVIDER_SITE_OTHER): Payer: Medicare Other | Admitting: Physician Assistant

## 2015-03-12 ENCOUNTER — Encounter: Payer: Self-pay | Admitting: Physician Assistant

## 2015-03-12 VITALS — BP 112/64 | HR 64 | Ht 64.0 in | Wt 198.6 lb

## 2015-03-12 DIAGNOSIS — K625 Hemorrhage of anus and rectum: Secondary | ICD-10-CM | POA: Diagnosis not present

## 2015-03-12 DIAGNOSIS — K429 Umbilical hernia without obstruction or gangrene: Secondary | ICD-10-CM

## 2015-03-12 LAB — CBC WITH DIFFERENTIAL/PLATELET
BASOS PCT: 0.2 % (ref 0.0–3.0)
Basophils Absolute: 0 10*3/uL (ref 0.0–0.1)
EOS PCT: 0.9 % (ref 0.0–5.0)
Eosinophils Absolute: 0.1 10*3/uL (ref 0.0–0.7)
HEMATOCRIT: 30.7 % — AB (ref 36.0–46.0)
Hemoglobin: 10 g/dL — ABNORMAL LOW (ref 12.0–15.0)
Lymphocytes Relative: 5.5 % — ABNORMAL LOW (ref 12.0–46.0)
Lymphs Abs: 0.5 10*3/uL — ABNORMAL LOW (ref 0.7–4.0)
MCHC: 32.5 g/dL (ref 30.0–36.0)
MCV: 83.5 fl (ref 78.0–100.0)
MONOS PCT: 6.3 % (ref 3.0–12.0)
Monocytes Absolute: 0.5 10*3/uL (ref 0.1–1.0)
NEUTROS ABS: 7.2 10*3/uL (ref 1.4–7.7)
Neutrophils Relative %: 87.1 % — ABNORMAL HIGH (ref 43.0–77.0)
Platelets: 164 10*3/uL (ref 150.0–400.0)
RBC: 3.68 Mil/uL — AB (ref 3.87–5.11)
RDW: 18.8 % — ABNORMAL HIGH (ref 11.5–15.5)
WBC: 8.3 10*3/uL (ref 4.0–10.5)

## 2015-03-12 LAB — BASIC METABOLIC PANEL
BUN: 44 mg/dL — AB (ref 6–23)
CHLORIDE: 100 meq/L (ref 96–112)
CO2: 31 mEq/L (ref 19–32)
CREATININE: 1.75 mg/dL — AB (ref 0.40–1.20)
Calcium: 9.4 mg/dL (ref 8.4–10.5)
GFR: 30.73 mL/min — ABNORMAL LOW (ref >60.00)
GLUCOSE: 123 mg/dL — AB (ref 70–99)
Potassium: 3.9 mEq/L (ref 3.5–5.1)
Sodium: 141 mEq/L (ref 135–145)

## 2015-03-12 NOTE — Progress Notes (Signed)
Patient ID: Tracey Duncan, female   DOB: Sep 20, 1945, 69 y.o.   MRN: [id number]   Subjective:    Patient ID: Tracey Duncan, female    DOB: 03-20-1946, 69 y.o.   MRN: [id number]  HPI   Tracey Duncan is a pleasant 69 year old white female new to GI today referred by Dr. Maclean/cardiology for evaluation of a recent episode of rectal bleeding. Patient has multiple serious medical problems including chronic atrial fibrillation for which she is on Eliquis , chronic kidney disease, obstructive sleep apnea, severe COPD for which she requires continuous oxygen 4 L at rest in 6-8 L with activity , rheumatoid arthritis, right heart failure with pulmonary artery hypertension. She relates having 1 prior colonoscopy which was done for screening in [place] she believes in 2007. She was told this was negative exam and to have follow-up in 10 years. She does not remember where this procedure was done.  Patient had an admission in May 2016 with atrial fibrillation and right heart failure.   Episode of rectal bleeding started on 02/26/2015. She was being seen by cardiology around that same time and was advised to hold her Eliquis for 2 days. She said this bleeding was painless , she had no abdominal cramping or discomfort no rectal discomfort. She had 1 bowel movement that contained bright red blood she thinks mixed in with the bowel movement and then had similar blood mixed with bowel movements over the next 2 days the bleeding then resolved and she has not had any since. She did not have any melena. Says she currently feels fine. She does have mild chronic anemia most recent hemoglobin 02/26/2015 was 11 hematocrit of 35 , reviewing labs hemoglobin was 11.3 in March 2016 and 10.8 in October 2015.  Family history is negative for colon cancer polyps.  Review of Systems Pertinent positive and negative review of systems were noted in the above HPI section.  All other review of systems was otherwise negative.  Outpatient Encounter  Prescriptions as of 03/12/2015  Medication Sig  . acetaminophen (TYLENOL) 500 MG tablet Take 1,000 mg by mouth daily as needed (for arthritic pain).  . ADVAIR DISKUS 250-50 MCG/DOSE AEPB INHALE 1 PUFF INTO THE LUNGS EVERY 12 (TWELVE) HOURS.  Marland Kitchen albuterol (PROAIR HFA) 108 (90 BASE) MCG/ACT inhaler Inhale 2 puffs into the lungs every 4 (four) hours as needed for wheezing or shortness of breath.   Marland Kitchen apixaban (ELIQUIS) 5 MG TABS tablet Take 1 tablet (5 mg total) by mouth 2 (two) times daily.  . bisoprolol (ZEBETA) 10 MG tablet TAKE 1 TABLET EVERY DAY  . calcium carbonate (TUMS - DOSED IN MG ELEMENTAL CALCIUM) 500 MG chewable tablet Chew 2 tablets by mouth daily as needed for indigestion or heartburn.   . calcium-vitamin D (OSCAL WITH D) 500-200 MG-UNIT per tablet Take 1 tablet by mouth daily.  . cetirizine (ZYRTEC) 10 MG tablet Take 10 mg by mouth at bedtime.   . cholecalciferol (VITAMIN D) 1000 UNITS tablet Take 1,000 Units by mouth daily.  . colchicine 0.6 MG tablet TAKE 1 TABLET BY MOUTH ONCE DAILY TO PREVENT GOUT FLARES  . febuxostat (ULORIC) 40 MG tablet Take 80 mg by mouth daily.   Marland Kitchen gabapentin (NEURONTIN) 300 MG capsule Take 300 mg by mouth 2 (two) times daily.  Marland Kitchen ipratropium-albuterol (DUONEB) 0.5-2.5 (3) MG/3ML SOLN Take 3 mLs by nebulization every 4 (four) hours as needed (for wheezing or shortness of breath).   . leflunomide (ARAVA) 20 MG tablet Take 20  mg by mouth daily.   Marland Kitchen levothyroxine (SYNTHROID, LEVOTHROID) 50 MCG tablet Take 1 tablet (50 mcg total) by mouth daily before breakfast.  . Macitentan (OPSUMIT) 10 MG TABS Take 10 mg by mouth daily.  . Magnesium 400 MG CAPS Take 1 capsule by mouth daily.  . metFORMIN (GLUCOPHAGE) 500 MG tablet Take 1 tablet (500 mg total) by mouth 2 (two) times daily with a meal.  . metolazone (ZAROXOLYN) 2.5 MG tablet Take 1 tablet (2.5 mg total) by mouth once a week. WEDNESDAYS.  Marland Kitchen montelukast (SINGULAIR) 10 MG tablet Take 10 mg by mouth at bedtime.  .  Multiple Vitamins-Minerals (CENTRUM SILVER PO) Take 1 tablet by mouth daily.  . Polyvinyl Alcohol-Povidone (REFRESH OP) Place 1 drop into both eyes daily as needed (for dry eyes).  . potassium chloride SA (K-DUR,KLOR-CON) 20 MEQ tablet Take 40 mEq by mouth 2 (two) times daily.  . pravastatin (PRAVACHOL) 20 MG tablet TAKE 1 TABLET BY MOUTH EVERY EVENING  . prednisoLONE 5 MG TABS tablet Take 5 mg by mouth daily.  Marland Kitchen PRESCRIPTION MEDICATION Oxygen.   Pt uses 6-8 liters of oxygen daily.  . sertraline (ZOLOFT) 50 MG tablet TAKE 1 TABLET BY MOUTH EVERY DAY (Patient taking differently: TAKE 1 TABLET BY MOUTH every evening)  . SPIRIVA HANDIHALER 18 MCG inhalation capsule INHALE 1 CAPSULE VIA HANDIHALER ONCE DAILY AT THE SAME TIME EVERY DAY  . Tadalafil, PAH, (ADCIRCA) 20 MG TABS Take 1 tablet (20 mg total) by mouth daily.  Marland Kitchen torsemide (DEMADEX) 20 MG tablet Take 72m (4 tablets) in am and 437m(2 tablets) in pm.  . traMADol (ULTRAM) 50 MG tablet Take 100 mg by mouth as needed for moderate pain.   . Marland KitchenLORIC 80 MG TABS   . DENTAGEL 1.1 % GEL dental gel Place 1 application onto teeth at bedtime.   . Marland KitchenVER THE COUNTER MEDICATION Apply 1 application topically daily as needed (dry mouth).  . [DISCONTINUED] leflunomide (ARAVA) 10 MG tablet Take 20 mg by mouth daily.   No facility-administered encounter medications on file as of 03/12/2015.   No Known Allergies Patient Active Problem List   Diagnosis Date Noted  . Pulmonary edema   . RLS (restless legs syndrome) 08/20/2014  . Pulmonary hypertension 07/17/2014  . Arthritis 01/16/2013  . Cor pulmonale 11/09/2012  . Secondary pulmonary hypertension 10/30/2012  . Prediabetes 10/25/2012  . Hypothyroidism 10/25/2012  . Age-related bone loss 10/25/2012  . Yeast infection of the skin 10/25/2012  . Chronic diastolic CHF (congestive heart failure) 10/08/2012  . COPD GOLD II  09/26/2012  . Hypoxemia 09/26/2012  . OSA 09/26/2012  . Obesity hypoventilation syndrome  09/26/2012  . Atrial fibrillation, chronic 09/26/2012  . HTN (hypertension) 09/26/2012   History   Social History  . Marital Status: Widowed    Spouse Name: N/A  . Number of Children: 0  . Years of Education: N/A   Occupational History  . retired     [employer] Social History Main Topics  . Smoking status: Former Smoker -- 1.50 packs/day for 28 years    Types: Cigarettes    Quit date: 09/06/1991  . Smokeless tobacco: Never Used  . Alcohol Use: 0.0 oz/week    0 Standard drinks or equivalent per week     Comment: "very seldom" 09/26/12  . Drug Use: No  . Sexual Activity: Not on file   Other Topics Concern  . Not on file   Social History Narrative   Regular exercise-no  Caffeine Use-yes          Tracey Duncan's family history includes Allergies in her mother; Arthritis in her mother; Breast cancer in her cousin; Colon polyps in her father; Diabetes in her sister; Emphysema in her mother; Heart disease in her brother and father; Ovarian cancer in her cousin; Tongue cancer in her paternal grandmother.      Objective:    Filed Vitals:   03/12/15 1023  BP: 112/64  Pulse: 64    Physical Exam  Well-developed older white female in no acute distress, accompanied by her sister patient is on portable oxygen at 4 L. HEENT; nontraumatic normocephalic EOMI PERRLA sclera anicteric, Supple; no JVD, Cardiovascular; irregular rate and rhythm with S1-S2 , Pulmonary decreased breath sounds bilaterally, Abdomen; soft nontender, nondistended, she does have a large umbilical hernia is partially reducible , no other palpable mass or hepatosplenomegaly, Rectal; exam no external hemorrhoids or palpable internal lesion stool is brown and Hemoccult negative , Extremities ;no clubbing cyanosis or edema skin warm and dry, Psych; mood and affect appropriate       Assessment & Plan:   #1 69 yo female with a recent episode of BRB per rectum/blood mixed with stool x 3 days- resolved and has not  recurred. Etiology  is not clear- r/o lesion, diverticular,internal hemorrhoid #2 chronic anticoagualtion-Eliquis #3 atrial fib #4 severe COPD- O2 dependent #4 pulm HTN/right heart failure #5 RA  #6 OSA  Plan; Patient has severe cardiopulmonary disease which make her very high risk  for complications with sedation for any endoscopic evaluation. She is not a candidate for Colonoscopy, unless for life threatening issue Will repeat CBC today  Schedule for Ct abd/pelvis to r/o neoplasm  Etc  Pt asked to call for any problems with recurrent bleeding She will be established with Dr Carlean Purl   Alfredia Ferguson PA-C 03/12/2015   Cc: Rowe Clack, MD

## 2015-03-12 NOTE — Patient Instructions (Addendum)
Please go to the basement level to have your labs drawn.   You have been scheduled for a CT scan of the abdomen and pelvis at [facility] ([address]---this is in the same building as Press photographer).   You are scheduled on  Friday 03-16-2015 at 3:30 PM . You should arrive at 3:15  to your appointment time for registration. Please follow the written instructions below on the day of your exam:  WARNING: IF YOU ARE ALLERGIC TO IODINE/X-RAY DYE, PLEASE NOTIFY RADIOLOGY IMMEDIATELY AT [phone]! YOU WILL BE GIVEN A 13 HOUR PREMEDICATION PREP.  1) Do not eat  anything after 11:30 am  (4 hours prior to your test) 2) You have been given 2 bottles of oral contrast to drink. The solution may taste better if refrigerated, but do NOT add ice or any other liquid to this solution. Shake well before drinking.    Drink 1 bottle of contrast @ 1:30  PM (2 hours prior to your exam)  Drink 1 bottle of contrast @  2:30 PM  (1 hour prior to your exam)  You may take any medications as prescribed with a small amount of water except for the following: Metformin, Glucophage, Glucovance, Avandamet, Riomet, Fortamet, Actoplus Met, Janumet, Glumetza or Metaglip. The above medications must be held the day of the exam AND 48 hours after the exam.  The purpose of you drinking the oral contrast is to aid in the visualization of your intestinal tract. The contrast solution may cause some diarrhea. Before your exam is started, you will be given a small amount of fluid to drink. Depending on your individual set of symptoms, you may also receive an intravenous injection of x-ray contrast/dye. Plan on being at [facility] for 30 minutes or long, depending on the type of exam you are having performed.  If you have any questions regarding your exam or if you need to reschedule, you may call the CT department at [phone] between the hours of 8:00 am and 5:00 pm,  Monday-Friday.  ________________________________________________________________________         Normal BMI (Body Mass Index- based on height and weight) is between 19 and 25. Your BMI today is Body mass index is 34.07 kg/(m^2). Marland Kitchen Please consider follow up  regarding your BMI with your Primary Care Provider.

## 2015-03-13 ENCOUNTER — Inpatient Hospital Stay: Admission: RE | Admit: 2015-03-13 | Payer: Medicare Other | Source: Ambulatory Visit

## 2015-03-13 ENCOUNTER — Other Ambulatory Visit: Payer: Self-pay | Admitting: Physician Assistant

## 2015-03-13 DIAGNOSIS — K429 Umbilical hernia without obstruction or gangrene: Secondary | ICD-10-CM

## 2015-03-13 DIAGNOSIS — K625 Hemorrhage of anus and rectum: Secondary | ICD-10-CM

## 2015-03-16 ENCOUNTER — Ambulatory Visit (INDEPENDENT_AMBULATORY_CARE_PROVIDER_SITE_OTHER)
Admission: RE | Admit: 2015-03-16 | Discharge: 2015-03-16 | Disposition: A | Discharge status: Home / Self Care | Payer: Medicare Other | Source: Ambulatory Visit | Attending: Physician Assistant | Admitting: Physician Assistant

## 2015-03-16 DIAGNOSIS — K625 Hemorrhage of anus and rectum: Secondary | ICD-10-CM | POA: Diagnosis not present

## 2015-03-16 DIAGNOSIS — K429 Umbilical hernia without obstruction or gangrene: Secondary | ICD-10-CM

## 2015-03-17 ENCOUNTER — Encounter: Payer: Self-pay | Admitting: Internal Medicine

## 2015-03-17 ENCOUNTER — Other Ambulatory Visit: Payer: Self-pay

## 2015-03-19 ENCOUNTER — Other Ambulatory Visit: Payer: Self-pay | Admitting: Emergency Medicine

## 2015-03-20 NOTE — Progress Notes (Signed)
Agree with Ms. Genia Harold assessment and plan. Gatha Mayer, MD, [facility]   CT reviewed and was negative for GI problem other than hernia. Radiologist diagnosed cellulitis of pannus but not evident clinically per Ms. Esterwood's note so I think radiologist incorrect.  Gatha Mayer, MD, [facility]

## 2015-03-22 ENCOUNTER — Other Ambulatory Visit: Payer: Self-pay | Admitting: Emergency Medicine

## 2015-03-22 ENCOUNTER — Other Ambulatory Visit: Payer: Self-pay | Admitting: Cardiology

## 2015-03-25 ENCOUNTER — Ambulatory Visit (INDEPENDENT_AMBULATORY_CARE_PROVIDER_SITE_OTHER): Payer: Medicare Other | Admitting: Podiatry

## 2015-03-25 ENCOUNTER — Other Ambulatory Visit: Payer: Self-pay

## 2015-03-25 ENCOUNTER — Ambulatory Visit (INDEPENDENT_AMBULATORY_CARE_PROVIDER_SITE_OTHER): Payer: Medicare Other

## 2015-03-25 ENCOUNTER — Encounter: Payer: Self-pay | Admitting: Podiatry

## 2015-03-25 ENCOUNTER — Telehealth: Payer: Self-pay | Admitting: *Deleted

## 2015-03-25 VITALS — BP 113/65 | HR 91 | Temp 97.1°F | Resp 12

## 2015-03-25 DIAGNOSIS — R52 Pain, unspecified: Secondary | ICD-10-CM

## 2015-03-25 DIAGNOSIS — G629 Polyneuropathy, unspecified: Secondary | ICD-10-CM | POA: Diagnosis not present

## 2015-03-25 DIAGNOSIS — E1142 Type 2 diabetes mellitus with diabetic polyneuropathy: Secondary | ICD-10-CM

## 2015-03-25 DIAGNOSIS — R0989 Other specified symptoms and signs involving the circulatory and respiratory systems: Secondary | ICD-10-CM

## 2015-03-25 MED ORDER — TADALAFIL (PAH) 20 MG PO TABS: 20.0000 mg | ORAL_TABLET | Freq: Every day | ORAL | Status: DC

## 2015-03-25 NOTE — Telephone Encounter (Signed)
Dr Amalia Hailey ordered B/L lower arterial dopplers for diminished pedal pulses and diabetic pt.

## 2015-03-25 NOTE — Progress Notes (Signed)
Subjective:    Patient ID: Tracey Duncan, female    DOB: [birth year], 68 y.o.   MRN: [id number]  HPI   This patient presents today complaining of a general tightness, numbness and intermittent sharp pains in both feet for the past 9 months. The symptoms are gradually progressing and are aggravated with motion on and off weightbearing, however, more noticeable upon standing. . She also complains of a generalized discomfort when she walks, improving with rest Patient has had no specific evaluation and/or treatment  She does have a history of prediabetes 10 years as well as a history of rheumatoid arthritis  Review of Systems  Eyes: Positive for visual disturbance.  Gastrointestinal: Positive for diarrhea.   Patient denies any history of skin ulceration, claudication or amputation       Objective:   Physical Exam  Orientated 3 Pain edema ankles bilaterally DP pulses 2/4 bilaterally PT pulse right 1/4 and PT pulse left 2/4  Neurological: Sensation to 10 g monofilament wire intact 2/5 right and 3/5 left Vibratory sensation nonreactive bilaterally Ankle reflexes reactive bilaterally  Dermatological: Palpable nodules in or around the first MPJ bilaterally No open skin lesions noted bilaterally  Musculoskeletal: Hammertoe noted second right Limit her range of motion ankle subtalar midtarsal joints bilaterally  X-ray examination left foot  Generalized decreased bone density Increased soft tissue density medial first MPJ Cystic and degenerative changes in the navicular with associated bony proliferation Inferior calcaneal spur Joint spaces are visible in the forefoot and rear foot  Radiographic impression: Rear foot changes may be consistent with osteoarthritic changes Decreased bony activity and cystic changes may be suggestive of a rheumatoid changes Joint spaces are intact Increased soft tissue density consistent with clinical palpation of rheumatoid nodules No  acute bony abnormality noted in the left foot   X-ray examination right foot Generalized decreased bone density noted in all views Dorsal exostosis navicular Increased soft tissue density around the medial first MPJ Inferior calcaneal spur Narrowing of first MPJ Joint spaces are visible throughout foot  Radiographic impression: Increased soft tissue density consistent with clinical palpation of rheumatoid nodules Navicular exostosis suggestive of osteoarthritic changes Joint spaces are generally maintained and no significant rheumatoid changes are noted No acute bony abnormality noted in the right foot       Assessment & Plan:   Assessment: Peripheral edema bilaterally Arterial vascular status appears to be adequate 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 I made aware that she had some symptoms of neuropathy. At this time I'm not recommending any additional treatment for neuropathy as she has a history of taking gabapentin 300 mg. In regards to her generalized status for on weightbearing some of the symptoms seem to be more consistent with possible osteoarthritic changes in or around the talonavicular area of both feet. I am recommending at this time patient wear athletic style shoes and ongoing continue spaces see if this reduces her symptoms. If so no additional treatment recommended. If she is still complains of some generalized discomfort in her feet I will consider accommodative orthotics.  Reappoint at patient's request

## 2015-03-25 NOTE — Patient Instructions (Addendum)
Your tingling and tightness are most likely associated with peripheral neuropathy and most likely associated with your prediabetic state 10 years The pain in the rear foot results from arthritic changes in the rear portion of your foot possibly even osteoarthritis than rheumatoid The vascular lab we'll schedule an appointment and we will notify you as to the results of vascular lab Diabetes and Foot Care Diabetes may cause you to have problems because of poor blood supply (circulation) to your feet and legs. This may cause the skin on your feet to become thinner, break easier, and heal more slowly. Your skin may become dry, and the skin may peel and crack. You may also have nerve damage in your legs and feet causing decreased feeling in them. You may not notice minor injuries to your feet that could lead to infections or more serious problems. Taking care of your feet is one of the most important things you can do for yourself.  HOME CARE INSTRUCTIONS  Wear shoes at all times, even in the house. Do not go barefoot. Bare feet are easily injured.  Check your feet daily for blisters, cuts, and redness. If you cannot see the bottom of your feet, use a mirror or ask someone for help.  Wash your feet with warm water (do not use hot water) and mild soap. Then pat your feet and the areas between your toes until they are completely dry. Do not soak your feet as this can dry your skin.  Apply a moisturizing lotion or petroleum jelly (that does not contain alcohol and is unscented) to the skin on your feet and to dry, brittle toenails. Do not apply lotion between your toes.  Trim your toenails straight across. Do not dig under them or around the cuticle. File the edges of your nails with an emery board or nail file.  Do not cut corns or calluses or try to remove them with medicine.  Wear clean socks or stockings every day. Make sure they are not too tight. Do not wear knee-high stockings since they may  decrease blood flow to your legs.  Wear shoes that fit properly and have enough cushioning. To break in new shoes, wear them for just a few hours a day. This prevents you from injuring your feet. Always look in your shoes before you put them on to be sure there are no objects inside.  Do not cross your legs. This may decrease the blood flow to your feet.  If you find a minor scrape, cut, or break in the skin on your feet, keep it and the skin around it clean and dry. These areas may be cleansed with mild soap and water. Do not cleanse the area with peroxide, alcohol, or iodine.  When you remove an adhesive bandage, be sure not to damage the skin around it.  If you have a wound, look at it several times a day to make sure it is healing.  Do not use heating pads or hot water bottles. They may burn your skin. If you have lost feeling in your feet or legs, you may not know it is happening until it is too late.  Make sure your health care provider performs a complete foot exam at least annually or more often if you have foot problems. Report any cuts, sores, or bruises to your health care provider immediately. SEEK MEDICAL CARE IF:   You have an injury that is not healing.  You have cuts or breaks in the  skin.  You have an ingrown nail.  You notice redness on your legs or feet.  You feel burning or tingling in your legs or feet.  You have pain or cramps in your legs and feet.  Your legs or feet are numb.  Your feet always feel cold. SEEK IMMEDIATE MEDICAL CARE IF:   There is increasing redness, swelling, or pain in or around a wound.  There is a red line that goes up your leg.  Pus is coming from a wound.  You develop a fever or as directed by your health care provider.  You notice a bad smell coming from an ulcer or wound. Document Released: 08/19/2000 Document Revised: 04/24/2013 Document Reviewed: 01/29/2013 [facility] Patient Information 2015 [place], [place]. This information  is not intended to replace advice given to you by your health care provider. Make sure you discuss any questions you have with your health care provider.

## 2015-03-26 ENCOUNTER — Encounter: Payer: Self-pay | Admitting: Internal Medicine

## 2015-03-26 ENCOUNTER — Encounter: Payer: Self-pay | Admitting: Podiatry

## 2015-03-30 ENCOUNTER — Encounter (HOSPITAL_COMMUNITY): Payer: Self-pay

## 2015-03-30 ENCOUNTER — Ambulatory Visit (HOSPITAL_COMMUNITY)
Admission: RE | Admit: 2015-03-30 | Discharge: 2015-03-30 | Disposition: A | Discharge status: Home / Self Care | Payer: Medicare Other | Source: Ambulatory Visit | Attending: Cardiology | Admitting: Cardiology

## 2015-03-30 VITALS — BP 108/62 | HR 62 | Wt 200.5 lb

## 2015-03-30 DIAGNOSIS — I270 Primary pulmonary hypertension: Secondary | ICD-10-CM

## 2015-03-30 DIAGNOSIS — I482 Chronic atrial fibrillation, unspecified: Secondary | ICD-10-CM

## 2015-03-30 DIAGNOSIS — I714 Abdominal aortic aneurysm, without rupture: Secondary | ICD-10-CM | POA: Insufficient documentation

## 2015-03-30 DIAGNOSIS — I5032 Chronic diastolic (congestive) heart failure: Secondary | ICD-10-CM | POA: Insufficient documentation

## 2015-03-30 DIAGNOSIS — N189 Chronic kidney disease, unspecified: Secondary | ICD-10-CM | POA: Insufficient documentation

## 2015-03-30 DIAGNOSIS — I739 Peripheral vascular disease, unspecified: Secondary | ICD-10-CM | POA: Insufficient documentation

## 2015-03-30 DIAGNOSIS — M069 Rheumatoid arthritis, unspecified: Secondary | ICD-10-CM | POA: Insufficient documentation

## 2015-03-30 DIAGNOSIS — I272 Other secondary pulmonary hypertension: Secondary | ICD-10-CM | POA: Insufficient documentation

## 2015-03-30 NOTE — Patient Instructions (Signed)
Take Torsemide 46m (4 tablets) twice today and tomorrow, then reduce back to normal dose of 80 mg (4 tablets) in am and 40 mg (2 tablets) in pm starting Wednesday.  Follow up 2 months.  Do the following things EVERYDAY: 1) Weigh yourself in the morning before breakfast. Write it down and keep it in a log. 2) Take your medicines as prescribed 3) Eat low salt foods-Limit salt (sodium) to 2000 mg per day.  4) Stay as active as you can everyday 5) Limit all fluids for the day to less than 2 liters

## 2015-03-30 NOTE — Progress Notes (Signed)
Patient ID: Tracey Duncan, female   DOB: 04/07/1946, 69 y.o.   MRN: [id number] PCP: Asa Lente  69 yo with history of COPD on home oxygen, OSA (? OHS/OSA), rheumatoid arthritis, and chronic atrial fibrillation presents for cardiology followup.  She was diagnosed with atrial fibrillation around 2004. She failed cardioversion and it has been chronic.  She has noted exertional dyspnea since at least 2012.  She was diagnosed with COPD back in 2002.  She no longer smokes.  She had been on CPAP for OSA prior, but was started on home oxygen all the time by Dr. Lamonte Sakai.  No tachypalpitations, no chest pain.  Echo in 2/14 showed a moderately dilated RV with moderately decreased systolic function, PA systolic pressure 65 mmHg. She was started on apixaban.   V/Q scan was negative for chronic PE.  She has been diagnosed with rheumatoid arthritis and is on [name].  She had a right heart cath in 11/14 that confirmed moderate to severe PAH with PVR 7.4 WU.  In 12/14, was started on Tyvaso. She was changed to Opsumit when [place] in 2015 was nearly identical to previous study in 07/2013.  Adcirca also added.   During a HF office visit 01/20/15, she c/o DOE with minimal activity despite increased dose of Lasix and decrease weight. Also had productive cough with green sputum, no fever. She was noted to be hypoxic and fluid overloaded and was admitted on for IV diuresis.  RHC after diuresis showed nearly normal filling pressures and PA pressure 44/19 (decreased from prior).    No further rectal bleeding (reported at last appointment).  Saw GI, thought too high risk for colonoscopy.  Had CT abdomen/pelvis w/o contrast with no cause for bleeding noted. Weight is up today but overall thinks her breathing is better.  It was her birthday yesterday, and she says that she over-indulged in sodium.  Walking in house without dyspnea.  Less joint pain.  Saw podiatrist, peripheral arterial dopplers ordered because peripheral pulses hard to palpate.    6 minute walk today: 172 m.   Labs (4/14): RF < 10, ANA negative, BNP 294, K 3.8, creatinine 1.7  Labs (9/14): K 4.5, creatinine 1.74 Labs (11/14): K 4.5=>3.9, creatinine 1.6, BNP 300 Labs (2/15): K 4.3, creatinine 1.6=>1.5, BNP 383=>311, LDL 72, HDL 49 Labs (3/15): K 4.2, creatinine 1.5 Labs (5/16) K 3.7, creatinine 1.6 Labs (02/12/2015): K 3.8 Creatinine 1.79  Labs (7/16): K 3.9, creatinine 1.75, HCT 30.7  PMH: 1. OSA/OHS: On CPAP at night and oxygen during the day. 2. COPD: Diagnosed 2002.  FEV1 65% predicted in 4/14.  3. HTN 4. Chronic atrial fibrillation: Failed DCCV in 12/04.   5. 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  Echo (2/14) with EF 55-60%, moderately dilated RV with moderately decreased systolic function, PA systolic pressure 65 mmHg.  V/Q scan in 3/14 was negative for chronic PE. PFTs (4/14): FEV1 65% predicted, mild obstructive defect with severely decreased DLCO.  RHC (11/14): mean RA 8, PA 68/28 mean 45, mean PCWP 11, CI 2.29, PVR 7.2 WU. 6 minute walk (2/15) 126 meters.  6 minute walk (3/15) 118 m.  Echo (4/16) with EF 55-60%, moderately dilated RV with mildly decreased systolic function, PASP 73 mmHg.  RHC (5/16) with mean RA 7, PA 44/19 mean 31, mean PCWP 18, CI 3.66, PVR 2 WU. She did not tolerate Tyvaso.  She is currently taking Adcirca and Opsumit.  6 minute walk (7/16) 172  meters.  6. Lexiscan [place] in 11/11 with no evidence for ischemia or infarction.  7. Impaired fasting glucose. 8. Hypothyroidism 9. Obesity. 10. CKD 11. Rheumatoid arthritis 12. ABIs (11/14): Normal 13. Hyperlipidemia: Myalgias with Crestor.  14. Depression 15. Chronic diastolic CHF 16. Umbilical hernia 17. AAA: 3.5 cm on 7/16 CT abdomen.  18. Rectal bleeding: 6/16.  CT abdomen did not show anything that would be likely to cause bleeding.   SH: Widow, moved to [place] from [place] to live with sister.  Quit smoking in 1993.  Rare ETOH.  Retired Optometrist.   FH: Brother with MI at 81, father with MI at 2.  ROS: All systems reviewed and negative except as per HPI.   Current Outpatient Prescriptions  Medication Sig Dispense Refill  . acetaminophen (TYLENOL) 500 MG tablet Take 1,000 mg by mouth daily as needed (for arthritic pain).    . ADVAIR DISKUS 250-50 MCG/DOSE AEPB INHALE 1 PUFF INTO THE LUNGS EVERY 12 (TWELVE) HOURS. 60 each 5  . albuterol (PROAIR HFA) 108 (90 BASE) MCG/ACT inhaler Inhale 2 puffs into the lungs every 4 (four) hours as needed for wheezing or shortness of breath.     Marland Kitchen apixaban (ELIQUIS) 5 MG TABS tablet Take 1 tablet (5 mg total) by mouth 2 (two) times daily. 60 tablet 3  . bisoprolol (ZEBETA) 10 MG tablet TAKE 1 TABLET EVERY DAY 30 tablet 6  . calcium carbonate (TUMS - DOSED IN MG ELEMENTAL CALCIUM) 500 MG chewable tablet Chew 2 tablets by mouth daily as needed for indigestion or heartburn.     . calcium-vitamin D (OSCAL WITH D) 500-200 MG-UNIT per tablet Take 1 tablet by mouth daily.    . cetirizine (ZYRTEC) 10 MG tablet Take 10 mg by mouth at bedtime.     . cholecalciferol (VITAMIN D) 1000 UNITS tablet Take 1,000 Units by mouth daily.    . colchicine 0.6 MG tablet TAKE 1 TABLET BY MOUTH ONCE DAILY TO PREVENT GOUT FLARES  3  . DENTAGEL 1.1 % GEL dental gel Place 1 application onto teeth at bedtime.     . febuxostat (ULORIC) 40 MG tablet Take 80 mg by mouth daily.     Marland Kitchen gabapentin (NEURONTIN) 300 MG capsule Take 300 mg by mouth 2 (two) times daily.    Marland Kitchen ipratropium-albuterol (DUONEB) 0.5-2.5 (3) MG/3ML SOLN Take 3 mLs by nebulization every 4 (four) hours as needed (for wheezing or shortness of breath).     . leflunomide (ARAVA) 20 MG tablet Take 20 mg by mouth daily.     Marland Kitchen levothyroxine (SYNTHROID, LEVOTHROID) 50 MCG tablet Take 1 tablet (50 mcg total) by mouth daily before breakfast. 90 tablet 3  . Macitentan (OPSUMIT) 10 MG TABS Take 10 mg by mouth daily. 30 tablet 6  . Magnesium 400 MG CAPS Take 1  capsule by mouth daily.    . metFORMIN (GLUCOPHAGE) 500 MG tablet Take 1 tablet (500 mg total) by mouth 2 (two) times daily with a meal. 180 tablet 3  . metolazone (ZAROXOLYN) 2.5 MG tablet Take 1 tablet (2.5 mg total) by mouth once a week. WEDNESDAYS. 15 tablet 3  . montelukast (SINGULAIR) 10 MG tablet Take 10 mg by mouth at bedtime.    . Multiple Vitamins-Minerals (CENTRUM SILVER PO) Take 1 tablet by mouth daily.    Marland Kitchen OVER THE COUNTER MEDICATION Apply 1 application topically daily as needed (dry mouth).    . Polyvinyl Alcohol-Povidone (REFRESH OP) Place 1 drop into both eyes daily  as needed (for dry eyes).    . potassium chloride SA (K-DUR,KLOR-CON) 20 MEQ tablet Take 40 mEq by mouth 2 (two) times daily.    . pravastatin (PRAVACHOL) 20 MG tablet TAKE 1 TABLET BY MOUTH EVERY EVENING 30 tablet 6  . prednisoLONE 5 MG TABS tablet Take 5 mg by mouth daily.    Marland Kitchen PRESCRIPTION MEDICATION Oxygen.   Pt uses 6-8 liters of oxygen daily.    Marland Kitchen SPIRIVA HANDIHALER 18 MCG inhalation capsule INHALE 1 CAPSULE VIA HANDIHALER ONCE DAILY AT THE SAME TIME EVERY DAY 1 capsule 3  . SPIRIVA HANDIHALER 18 MCG inhalation capsule INHALE 1 CAPSULE VIA HANDIHALER ONCE DAILY AT THE SAME TIME EVERY DAY 30 capsule 2  . Tadalafil, PAH, (ADCIRCA) 20 MG TABS Take 1 tablet (20 mg total) by mouth daily. 60 tablet 6  . torsemide (DEMADEX) 20 MG tablet Take 2m (4 tablets) in am and 489m(2 tablets) in pm. 180 tablet 3  . traMADol (ULTRAM) 50 MG tablet Take 100 mg by mouth as needed for moderate pain.     . Marland KitchenLORIC 80 MG TABS     . sertraline (ZOLOFT) 50 MG tablet TAKE 1 TABLET BY MOUTH EVERY DAY (Patient taking differently: TAKE 1 TABLET BY MOUTH every evening) 30 tablet 8   No current facility-administered medications for this encounter.    BP 108/62 mmHg  Pulse 62  Wt 200 lb 8 oz (90.946 kg)  SpO2 93% General: NAD, obese.  Neck: JVP 8-9 cm, no thyromegaly or thyroid nodule.  Lungs: Decreased breath sounds, slight crackles  at bases. On 4 liters continuous oxygen.   CV: Nondisplaced PMI.  Heart irregular S1/S2, no S3/S4, no murmur. 1+ ankle edema bilaterally.  No carotid bruit.  Difficult to palpate pedal pulses.  Abdomen: Soft, nontender, no hepatosplenomegaly, no distention.  Skin: Intact without lesions or rashes.  Neurologic: Alert and oriented x 3.  Psych: Normal affect. Extremities: No clubbing or cyanosis.    Assessment/Plan: 1. Atrial fibrillation: Chronic x years.  She would be unlikely to remain in NSR if cardioversion were attempted.  Plan rate control.  CHADSVASC = 3 at least.  No further GI bleeding.     - Continue apixaban.  - Continue current dose of  bisoprolol.  2. Pulmonary arterial HTN/cor pulmonale: Significant right heart failure.  Last echo in 4/16 with moderately dilated and mildly dysfunctional RV.  Most recent RHKenton Valen 5/16 showed significantly improved PAH, PVR down to 2 WU.  PAH likely has mixed etiology: Suspect that there is a Group I PAH component from rheumatoid arthritis, but patient also has COPD and OHS/OSA.  FEV1 is 65%, so the degree of pulmonary HTN does seem out of proportion to COPD alone.  Given suspected component of Group I PAH, I started her on Tyvaso but she did not tolerate this.  Currently on Adcirca and Opsumit with good RHC results in 5/16 as above.  - Continue Adcirca and Opsumit at current doses.  Have room to increase Adcirca in the future if needed.    - 6 minute walk today showed significant improvement, up to 172 meters.  3. CKD: Creatinine stable recently.     4. RA: Being treated (Dr. BeAmil Amen  Stable joint symptoms.  5. Chronic diastolic CHF with prominent RV failure:  NYHA III.  Mild volume overload today, likely related to recent dietary indiscretion. - Increase torsemide to 80 mg bid x 2 days, then back to 80 qam/40 qpm.  Continue metolazone once  a week.  6. AAA: 3.5 cm on CT in 7/16.  She will need abdominal US in 7/18 to follow.  7. PAD: Difficult to  palpate pedal pulses.  Podiatry has ordered peripheral arterial doppler evaluation.    Follow up in 2 months.   [name] 03/30/2015

## 2015-03-30 NOTE — Progress Notes (Signed)
6 Minute Walk Patient started on 8L O2 via [place]: Sats 88% HR:94  Patient walked total of 565 ft (172.212 meters) with steady gait and pace, no rest breaks needed. HR did max at 160, but patient was not symptomatic or SOB. O2 sat maintained 87-89% on 8 L continuous O2 via [place].  Dr. Aundra Dubin made aware of results.  Tracey Duncan

## 2015-03-31 ENCOUNTER — Encounter: Payer: Self-pay | Admitting: Emergency Medicine

## 2015-03-31 ENCOUNTER — Ambulatory Visit (INDEPENDENT_AMBULATORY_CARE_PROVIDER_SITE_OTHER): Payer: Medicare Other | Admitting: Emergency Medicine

## 2015-03-31 VITALS — BP 102/68 | HR 51 | Ht 64.0 in | Wt 200.0 lb

## 2015-03-31 DIAGNOSIS — I272 Other secondary pulmonary hypertension: Secondary | ICD-10-CM | POA: Diagnosis not present

## 2015-03-31 DIAGNOSIS — G2581 Restless legs syndrome: Secondary | ICD-10-CM

## 2015-03-31 DIAGNOSIS — J449 Chronic obstructive pulmonary disease, unspecified: Secondary | ICD-10-CM | POA: Diagnosis not present

## 2015-03-31 DIAGNOSIS — IMO0002 Reserved for concepts with insufficient information to code with codable children: Secondary | ICD-10-CM

## 2015-03-31 MED ORDER — FLUTICASONE PROPIONATE 50 MCG/ACT NA SUSP: 2.0000 | Freq: Every day | NASAL | Status: DC

## 2015-03-31 NOTE — Progress Notes (Signed)
Subjective:  Patient ID: Tracey Duncan, female    DOB: 06/12/1946, 69 y.o.   MRN: [id number] HPI 69 yo woman, former tobacco (46 pk-yrs), hx of OSA dx in 4/13 (on CPAP + O2), HTN, A fib (failed cardioversion), allergies. COPD dx in 2002, currently on Advair + Spiriva. Albuterol prn, uses it only . Since moving to [place] in last 6 months she has had more SOB. She is having occasional wheeze, no cough. No CP.  Significant hypoxemia on arrival. Needs a local PCP and cardiologist  ROV 10/30/12 -- COPD, OSA, allergies, A Fib, HTN. She was seen by Dr Aundra Dubin and started anticoagulation for her A fib. Also had TTE that showed PAH with PASP ~ 10mHg. She is wearing her O2 reliably - has a documented desat on O2 at Dr MClaris Gladdenoffice. She is ordered for 6L and 8L with exertion.  Currently on advair, spiriva. Singulair + zyrtec.   ROV 12/05/12 -- COPD, OSA, allergies, A Fib, HTN. Also secondary PAH and severe hypoxemia.  PFT today with moderately severe AFL, no BD response, decreased DLCO. A V/Q on 11/21/12 was normal. Her lasix has been adjusted by Dr MAundra Dubin She is using her oximizer reliably. She is trying to wear CPAP reliably, but still poor sleep - difficulty falling asleep and also staying asleep. Supposed to have ONO on CPAP 4/3.  ROV 01/16/13 --  COPD (FEV1 1.38L), OSA on CPAP, allergies, A Fib, HTN. Also secondary PAH and severe hypoxemia.  Returns for f/u. Reports that her wt has dropped another 8 lbs since our last visit,  breathing well. ONO on CPAP + 3L/min performed > shows 3 discrete episodes of desat that could represent REM-related desats (the report says 2, but it was really 3L/min). Unfortunately about a week ago she developed R UE, hand pain, L knee pain. No f/c, no medication changes or other infectious sx. She follows w [facility].   PULMONARY FUNCTON TEST 12/05/2012  FVC 2.15  FEV1 1.38  FEV1/FVC 64.2  FVC  % Predicted 73  FEV % Predicted 65  FeF 25-75 .65  FeF 25-75 % Predicted 2.44       05/29/13 -- COPD, OSA, newly dx RA. Secondary PAH and hypoxemia. She was started on prednisone, now weaning and being converted to [place] She feels MUCH better, is more active. She is compliant with her oximizer. She wears her CPAP. Compliance data shows that she wears it, she states that it helps her clinically.  She is having more nasal drainage, cough with clear sputum.   09/26/13 -- COPD, OSA, newly dx RA. Secondary PAH and hypoxemia. She tells me that her breathing has been worse over the last several months. R heart cath (11/14): mean RA 8, PA 68/28 mean 45, mean PCWP 11, CI 2.29, PVR 7.2 WU. She was started on Tyvaso by Dr MAundra Dubin has titrated up to 9 puffs qid. She unfortunately feels worse not better, is now limited as to her activity, cannot walk any significant distance (a clear change). She also has now weaned off of prednisone and is on [place] which could be an influence here. She is on Advair and Spiriva.   ROV 11/13/13 -- COPD, OSA, RA, A Fib, secondary PAH and hypoxemia. She has been on Tyvaso since 12/17, 9 puffs since January. She is also on Arava and Pred 5. She feels that the last several months have been characterized by worsening dyspnea, some worsening edema > her lasix has been increased by Dr MAundra Dubinwith  some improvement in edema. Her rheumatolgist believes that she could be off pred from their standpoint. She remains on Spiriva + Advair. She remains on CPAP - but hasn't used it last several weeks due to cough. She has not had repeat R heart cath or TTE on Tyvaso yet. She is getting purulent mucous from her nose, resulting in cough. She is on zyrtec and sigulair.    ROV 04/30/14 -- COPD, OSA, RA, A Fib, secondary PAH and hypoxemia; she is managed with arava + pred. She is now off Tyvaso and she feels better off of it. She has some cough, nagging an prod of white. She has not been wearing her CPAP reliably - has lost 100 lbs since her PSG.    R heart cath 02/06/14 -- Final Conclusions:  RA pressure and PCWP look good, still with moderate pulmonary hypertension. This study really shows no significant change compared to the pre-Tyvaso RHC. I suspect that patient may have [place] primarily due to COPD and OHS/OSA rather than group I PAH.  Recommendations: Will have her hold Tyvaso and see if this makes her feel any worse. If not, will probably stop it.   ROV 05/29/14 -- hx COPD, OSA, RA, A fib, hypoxemia and secondary PAH. Last time we ordered split night PSG, scheduled for 06/23/14. She is on high-flow O2. Feels well - has been active.  She asks today whether she can / should get Tdap immunization, prevnar-13  ROV 08/08/14 -- hx COPD, OSA, RA, A fib, hypoxemia and secondary PAH on opsumit. Her PSG showed hypoxemia without obstructive events, evidence for limb movement disorder.  For the last 5 days she has had increasing congestion, mucous from head and chest. No clear fever.  We discussed her limb movement - she wants to hold off on meds for this now.   ROV 12/25/14 -- hx COPD, nocturnal hypoxemia and restless leg syndrome, RA, A fib, hypoxemia and secondary PAH on opsumit. She was seen by Dr. Melvyn Novas in January.  Her lasix was changed to torsemide about 2 weeks ago. Her wt has gone up about 10-15 lbs, some of this likely fluid. She was more SOB for the last 2 weeks and increased her albuterol use. She remains on opsumit, just started adcirca 4/21. Her last TTE was 12/11/14 as below. Remains  on advair + spiriva, zyrtec, singulair. She is on prednisone 19m     ROV 03/31/15 -- follow-up visit for COPD, rheumatoid arthritis, age of fibrillation and secondary pulmonary hypertension. She's been dealing with volume overload, lower extremity pain and possible restless leg syndrome. She is maintained on Adcirca, opsumit and eliquis, also on Spiriva and Advair for her obstructive lung disease. She was unable to tolerate tyvaso. She is on chronic prednisolone and leflunomide. Her allergy regimen includes  Singulair and Zyrtec.  She was hospitalized for volume overload in May, underwent diuresis. She saw Dr MAundra Dubinyesterday and torsemide was temporarily increased. She underwent a CT abdomen for BRBPR 03/16/15 that was reassuring. The bleeding stopped after eliquis was held. She has tolerated Spiriva and Advair. Her exercise tolerance is better, did a 6 minute walk 7/25.     Objective:   Physical Exam Filed Vitals:   03/31/15 1104  BP: 102/68  Pulse: 51  Height: _0  (1.626 m)  Weight: 200 lb (90.719 kg)  SpO2: 90%   Gen: Pleasant, obese, in no distress,  normal affect  ENT: No lesions,  mouth clear,  oropharynx clear, no postnasal drip  Neck:  No JVD, no TMG, no carotid bruits  Lungs: No use of accessory muscles, clear without rales or rhonchi  Cardiovascular: RRR, heart sounds normal, no murmur or gallops, 1+ peripheral edema  Musculoskeletal: No deformities, no cyanosis or clubbing  Neuro: alert, non focal  Skin: Warm, no lesions or rashes   R heart cath 07/11/13  -- RA mean 8  RV 70/7  PA 68/28, mean 45  PCWP mean 11  Oxygen saturations (on 5L home oxygen):  PA 64%  AO 99%  Cardiac Output (Fick) 4.74  Cardiac Index (Fick) 2.29  PVR 7.2 WU   12/11/14 --  Study Conclusions - Left ventricle: The cavity size was normal. Wall thickness was normal. Systolic function was normal. The estimated ejection fraction was in the range of 55% to 60%. Indeterminant diastolic function (atrial fibrillation). Wall motion was normal; there were no regional wall motion abnormalities. - Mitral valve: Moderately calcified annulus. There was mild regurgitation. - Left atrium: The atrium was severely dilated. - Right ventricle: The cavity size was moderately dilated. Systolic function was mildly reduced. - Right atrium: The atrium was severely dilated. - Tricuspid valve: Peak RV-RA gradient (S): 58 mm Hg. - Pulmonary arteries: PA peak pressure: 73 mm Hg (S). - Systemic  veins: IVC measured 2.5 cm with < 50% respirophasic variation, suggesting RA pressure 15 mmHg.      Assessment & Plan:  COPD GOLD II  We will continue Spiriva and Advair as she has been taking it  Secondary pulmonary hypertension Appears to be very well compensated since her recent admission for more aggressive diuresis. Her torsemide and metolazone have been adjusted. She had a 6 minute walk yesterday and her last echocardiogram was in April. Currently she is on Adcirca, opsumit, anticoagulation. Would continue these and current diuretic regimen. She stable on her current oxygen dose  RLS (restless legs syndrome) Her leg discomfort and neuropathic pain have improved on gabapentin. I believe we can defer starting Requip at this time

## 2015-03-31 NOTE — Patient Instructions (Addendum)
Please continue your Opsumit, Adcirca, Eliquis as you are taking them  Wear your oxygen at 4-8L/min Continue your spiriva and advair, zyrtec and singulair Start fluticasone nasal spray, 2 sprays each side 1 time a day. You could consider increasing to twice a day if you see some benefit.  Follow with Dr Lamonte Sakai in 3 months or sooner if you have any problems.

## 2015-03-31 NOTE — Assessment & Plan Note (Signed)
We will continue Spiriva and Advair as she has been taking it

## 2015-03-31 NOTE — Addendum Note (Signed)
Addended by: Desmond Dike C on: 03/31/2015 11:37 AM   Modules accepted: Orders

## 2015-03-31 NOTE — Assessment & Plan Note (Signed)
Appears to be very well compensated since her recent admission for more aggressive diuresis. Her torsemide and metolazone have been adjusted. She had a 6 minute walk yesterday and her last echocardiogram was in April. Currently she is on Adcirca, opsumit, anticoagulation. Would continue these and current diuretic regimen. She stable on her current oxygen dose

## 2015-03-31 NOTE — Assessment & Plan Note (Signed)
Her leg discomfort and neuropathic pain have improved on gabapentin. I believe we can defer starting Requip at this time

## 2015-04-10 DIAGNOSIS — J449 Chronic obstructive pulmonary disease, unspecified: Secondary | ICD-10-CM | POA: Diagnosis not present

## 2015-04-10 DIAGNOSIS — H2513 Age-related nuclear cataract, bilateral: Secondary | ICD-10-CM | POA: Diagnosis not present

## 2015-04-22 ENCOUNTER — Ambulatory Visit (HOSPITAL_COMMUNITY)
Admission: RE | Admit: 2015-04-22 | Discharge: 2015-04-22 | Disposition: A | Discharge status: Home / Self Care | Payer: Medicare Other | Source: Ambulatory Visit | Attending: Podiatry | Admitting: Podiatry

## 2015-04-22 DIAGNOSIS — I10 Essential (primary) hypertension: Secondary | ICD-10-CM | POA: Diagnosis not present

## 2015-04-22 DIAGNOSIS — I70203 Unspecified atherosclerosis of native arteries of extremities, bilateral legs: Secondary | ICD-10-CM | POA: Diagnosis not present

## 2015-04-22 DIAGNOSIS — J449 Chronic obstructive pulmonary disease, unspecified: Secondary | ICD-10-CM | POA: Diagnosis not present

## 2015-04-22 DIAGNOSIS — F17200 Nicotine dependence, unspecified, uncomplicated: Secondary | ICD-10-CM | POA: Diagnosis not present

## 2015-04-22 DIAGNOSIS — R0989 Other specified symptoms and signs involving the circulatory and respiratory systems: Secondary | ICD-10-CM

## 2015-04-23 ENCOUNTER — Other Ambulatory Visit (HOSPITAL_COMMUNITY): Payer: Self-pay | Admitting: *Deleted

## 2015-04-23 MED ORDER — TADALAFIL (PAH) 20 MG PO TABS: 20.0000 mg | ORAL_TABLET | Freq: Every day | ORAL | Status: DC

## 2015-04-23 NOTE — Telephone Encounter (Signed)
Called in refill for Adcirca. #30 for 3 refills.

## 2015-04-27 DIAGNOSIS — J449 Chronic obstructive pulmonary disease, unspecified: Secondary | ICD-10-CM | POA: Diagnosis not present

## 2015-04-29 DIAGNOSIS — H25811 Combined forms of age-related cataract, right eye: Secondary | ICD-10-CM | POA: Diagnosis not present

## 2015-04-29 DIAGNOSIS — H2511 Age-related nuclear cataract, right eye: Secondary | ICD-10-CM | POA: Diagnosis not present

## 2015-05-05 ENCOUNTER — Telehealth: Payer: Self-pay | Admitting: *Deleted

## 2015-05-05 NOTE — Telephone Encounter (Addendum)
-----  Message from Gean Birchwood, DPM sent at 04/28/2015  8:03 AM EDT ----- Results of lower extremity arterial Doppler dated 04/23/2015 Results are adequate at this time and no follow-up required Contact patient and advise her that vascular lab is adequate and no follow-up needed.  I informed pt of Dr. Phoebe Perch review of dopplers results.

## 2015-05-11 DIAGNOSIS — J449 Chronic obstructive pulmonary disease, unspecified: Secondary | ICD-10-CM | POA: Diagnosis not present

## 2015-05-12 ENCOUNTER — Other Ambulatory Visit: Payer: Self-pay | Admitting: Physician Assistant

## 2015-05-12 NOTE — Telephone Encounter (Signed)
Pt requesting Rx, [place] pt.

## 2015-05-22 ENCOUNTER — Other Ambulatory Visit: Payer: Self-pay

## 2015-05-22 DIAGNOSIS — I5032 Chronic diastolic (congestive) heart failure: Secondary | ICD-10-CM

## 2015-05-22 NOTE — Telephone Encounter (Signed)
Approved      Disp Refills Start End    Macitentan (OPSUMIT) 10 MG TABS 30 tablet 6 11/06/2014     Sig - Route:  Take 10 mg by mouth daily. - Oral    Class:  Normal    DAW:  No    Authorizing Provider:  Larey Dresser, MD    Ordering User:  [place], [place]

## 2015-05-28 DIAGNOSIS — J449 Chronic obstructive pulmonary disease, unspecified: Secondary | ICD-10-CM | POA: Diagnosis not present

## 2015-05-31 ENCOUNTER — Other Ambulatory Visit (HOSPITAL_COMMUNITY): Payer: Self-pay | Admitting: Cardiology

## 2015-06-08 ENCOUNTER — Ambulatory Visit (HOSPITAL_COMMUNITY)
Admission: RE | Admit: 2015-06-08 | Discharge: 2015-06-08 | Disposition: A | Discharge status: Home / Self Care | Payer: Medicare Other | Source: Ambulatory Visit | Attending: Cardiology | Admitting: Cardiology

## 2015-06-08 VITALS — BP 126/74 | HR 110 | Wt 205.8 lb

## 2015-06-08 DIAGNOSIS — J449 Chronic obstructive pulmonary disease, unspecified: Secondary | ICD-10-CM | POA: Insufficient documentation

## 2015-06-08 DIAGNOSIS — G4733 Obstructive sleep apnea (adult) (pediatric): Secondary | ICD-10-CM | POA: Diagnosis not present

## 2015-06-08 DIAGNOSIS — I482 Chronic atrial fibrillation, unspecified: Secondary | ICD-10-CM

## 2015-06-08 DIAGNOSIS — Z7902 Long term (current) use of antithrombotics/antiplatelets: Secondary | ICD-10-CM | POA: Diagnosis not present

## 2015-06-08 DIAGNOSIS — I5032 Chronic diastolic (congestive) heart failure: Secondary | ICD-10-CM | POA: Insufficient documentation

## 2015-06-08 DIAGNOSIS — I272 Other secondary pulmonary hypertension: Secondary | ICD-10-CM | POA: Insufficient documentation

## 2015-06-08 DIAGNOSIS — E785 Hyperlipidemia, unspecified: Secondary | ICD-10-CM | POA: Insufficient documentation

## 2015-06-08 DIAGNOSIS — Z9981 Dependence on supplemental oxygen: Secondary | ICD-10-CM | POA: Insufficient documentation

## 2015-06-08 DIAGNOSIS — Z79899 Other long term (current) drug therapy: Secondary | ICD-10-CM | POA: Insufficient documentation

## 2015-06-08 DIAGNOSIS — M069 Rheumatoid arthritis, unspecified: Secondary | ICD-10-CM | POA: Insufficient documentation

## 2015-06-08 DIAGNOSIS — I129 Hypertensive chronic kidney disease with stage 1 through stage 4 chronic kidney disease, or unspecified chronic kidney disease: Secondary | ICD-10-CM | POA: Diagnosis not present

## 2015-06-08 DIAGNOSIS — Z87891 Personal history of nicotine dependence: Secondary | ICD-10-CM | POA: Diagnosis not present

## 2015-06-08 DIAGNOSIS — N189 Chronic kidney disease, unspecified: Secondary | ICD-10-CM | POA: Diagnosis not present

## 2015-06-08 DIAGNOSIS — E039 Hypothyroidism, unspecified: Secondary | ICD-10-CM | POA: Diagnosis not present

## 2015-06-08 LAB — CBC
HEMATOCRIT: 34 % — AB (ref 36.0–46.0)
Hemoglobin: 10.3 g/dL — ABNORMAL LOW (ref 12.0–15.0)
MCH: 26.1 pg (ref 26.0–34.0)
MCHC: 30.3 g/dL (ref 30.0–36.0)
MCV: 86.1 fL (ref 78.0–100.0)
PLATELETS: 136 10*3/uL — AB (ref 150–400)
RBC: 3.95 MIL/uL (ref 3.87–5.11)
RDW: 18.1 % — AB (ref 11.5–15.5)
WBC: 8.2 10*3/uL (ref 4.0–10.5)

## 2015-06-08 LAB — BASIC METABOLIC PANEL
Anion gap: 12 (ref 5–15)
BUN: 45 mg/dL — AB (ref 6–20)
CO2: 28 mmol/L (ref 22–32)
CREATININE: 1.73 mg/dL — AB (ref 0.44–1.00)
Calcium: 9 mg/dL (ref 8.9–10.3)
Chloride: 101 mmol/L (ref 101–111)
GFR, EST AFRICAN AMERICAN: 34 mL/min — AB (ref >60)
GFR, EST NON AFRICAN AMERICAN: 29 mL/min — AB (ref >60)
Glucose, Bld: 156 mg/dL — ABNORMAL HIGH (ref 65–99)
POTASSIUM: 3.8 mmol/L (ref 3.5–5.1)
SODIUM: 141 mmol/L (ref 135–145)

## 2015-06-08 LAB — BRAIN NATRIURETIC PEPTIDE: B NATRIURETIC PEPTIDE 5: 255.8 pg/mL — AB (ref 0.0–100.0)

## 2015-06-08 MED ORDER — POTASSIUM CHLORIDE CRYS ER 20 MEQ PO TBCR: EXTENDED_RELEASE_TABLET | ORAL | Status: DC

## 2015-06-08 MED ORDER — METOLAZONE 2.5 MG PO TABS: 2.5000 mg | ORAL_TABLET | ORAL | Status: DC

## 2015-06-08 MED ORDER — TORSEMIDE 20 MG PO TABS: ORAL_TABLET | ORAL | Status: DC

## 2015-06-08 NOTE — Progress Notes (Signed)
Advanced Heart Failure Medication Review by a Pharmacist  Does the patient  feel that his/her medications are working for him/her?  yes  Has the patient been experiencing any side effects to the medications prescribed?  no  Does the patient measure his/her own blood pressure or blood glucose at home?  yes   Does the patient have any problems obtaining medications due to transportation or finances?   no  Understanding of regimen: good Understanding of indications: good Potential of compliance: good    Pharmacist comments:  Ms. Tracey Duncan is a pleasant 69 yo F presenting with a current medication list. She reports good compliance with her medications and seems to understand the importance of continued use. She does admit that she has recently run out of her [place] and gabapentin but she has an appointment with her PCP tomorrow so will be asking for refills then. She did not have any other medication-related questions or concerns for me at this time.   [name]. [name], PharmD, BCPS, CPP Clinical Pharmacist Pager: [phone] Phone: [phone] 06/08/2015 10:07 AM

## 2015-06-08 NOTE — Patient Instructions (Signed)
Increase Torsemide to 80 mg (4 tabs) Twice daily FOR 3 DAYS ONLY, then take 80 mg (4 tabs) in AM and 60 mg (3 tabs) in PM  Increase Potassium to 60 meq (3 tabs) in AM and 40 meq (2 tabs) in PM, take an extra 20 meq (1 tab) when you take Metolazone  Take Metolazone Tue and Wed, then resume taking every Wed  Labs today  Labs in 1 week  Your physician recommends that you schedule a follow-up appointment in: 1 month

## 2015-06-08 NOTE — Progress Notes (Signed)
Patient ID: Tracey Duncan, female   DOB: 12-Aug-1946, 69 y.o.   MRN: [id number] PCP: Asa Lente  69 yo with history of COPD on home oxygen, OSA (? OHS/OSA), rheumatoid arthritis, and chronic atrial fibrillation presents for cardiology followup.  She was diagnosed with atrial fibrillation around 2004. She failed cardioversion and it has been chronic.  She has noted exertional dyspnea since at least 2012.  She was diagnosed with COPD back in 2002.  She no longer smokes.  She had been on CPAP for OSA prior, but was started on home oxygen all the time by Dr. Lamonte Sakai.  No tachypalpitations, no chest pain.  Echo in 2/14 showed a moderately dilated RV with moderately decreased systolic function, PA systolic pressure 65 mmHg. She was started on apixaban.   V/Q scan was negative for chronic PE.  She has been diagnosed with rheumatoid arthritis and is on [name].  She had a right heart cath in 11/14 that confirmed moderate to severe PAH with PVR 7.4 WU.  In 12/14, was started on Tyvaso. She was changed to Opsumit when [place] in 2015 was nearly identical to previous study in 07/2013.  Adcirca also added.   During a HF office visit 01/20/15, she c/o DOE with minimal activity despite increased dose of Lasix and decrease weight. Also had productive cough with green sputum, no fever. She was noted to be hypoxic and fluid overloaded and was admitted on for IV diuresis.  RHC after diuresis showed nearly normal filling pressures and PA pressure 44/19 (decreased from prior).    No further rectal bleeding (reported at a prior appointment).  Saw GI, thought too high risk for colonoscopy.  Had CT abdomen/pelvis w/o contrast with no cause for bleeding noted. Weight is up today by 5 lbs.  She had guests over the weekend and admits to some dietary indiscretion.  No dyspnea walking around her house.  She does have some dyspnea doing housework.  No orthopnea/PND.  No chest pain.  No lightheadedness.  Oxygen saturation is lower today.   Labs  (4/14): RF < 10, ANA negative, BNP 294, K 3.8, creatinine 1.7  Labs (9/14): K 4.5, creatinine 1.74 Labs (11/14): K 4.5=>3.9, creatinine 1.6, BNP 300 Labs (2/15): K 4.3, creatinine 1.6=>1.5, BNP 383=>311, LDL 72, HDL 49 Labs (3/15): K 4.2, creatinine 1.5 Labs (5/16) K 3.7, creatinine 1.6 Labs (02/12/2015): K 3.8 Creatinine 1.79  Labs (7/16): K 3.9, creatinine 1.75, HCT 30.7  PMH: 1. OSA/OHS: On CPAP at night and oxygen during the day. 2. COPD: Diagnosed 2002.  FEV1 65% predicted in 4/14.  3. HTN 4. Chronic atrial fibrillation: Failed DCCV in 12/04.   5. 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  Echo (2/14) with EF 55-60%, moderately dilated RV with moderately decreased systolic function, PA systolic pressure 65 mmHg.  V/Q scan in 3/14 was negative for chronic PE. PFTs (4/14): FEV1 65% predicted, mild obstructive defect with severely decreased DLCO.  RHC (11/14): mean RA 8, PA 68/28 mean 45, mean PCWP 11, CI 2.29, PVR 7.2 WU. 6 minute walk (2/15) 126 meters.  6 minute walk (3/15) 118 m.  Echo (4/16) with EF 55-60%, moderately dilated RV with mildly decreased systolic function, PASP 73 mmHg.  RHC (5/16) with mean RA 7, PA 44/19 mean 31, mean PCWP 18, CI 3.66, PVR 2 WU. She did not tolerate Tyvaso.  She is currently taking Adcirca and Opsumit.  6 minute walk (7/16) 172 meters.  6. Carlton Adam  Thallium in 11/11 with no evidence for ischemia or infarction.  7. Impaired fasting glucose. 8. Hypothyroidism 9. Obesity. 10. CKD 11. Rheumatoid arthritis 12. ABIs (11/14): Normal 13. Hyperlipidemia: Myalgias with Crestor.  14. Depression 15. Chronic diastolic CHF 16. Umbilical hernia 17. AAA: 3.5 cm on 7/16 CT abdomen.  18. Rectal bleeding: 6/16.  CT abdomen did not show anything that would be likely to cause bleeding.  19. ABIs (8/16): Normal.   SH: Widow, moved to [place] from [place] to live with sister.  Quit smoking in 1993.  Rare ETOH. Retired Optometrist.    FH: Brother with MI at 58, father with MI at 3.  ROS: All systems reviewed and negative except as per HPI.   Current Outpatient Prescriptions  Medication Sig Dispense Refill  . acetaminophen (TYLENOL) 500 MG tablet Take 1,000 mg by mouth daily as needed (for arthritic pain).    . ADVAIR DISKUS 250-50 MCG/DOSE AEPB INHALE 1 PUFF INTO THE LUNGS EVERY 12 (TWELVE) HOURS. 60 each 5  . albuterol (PROAIR HFA) 108 (90 BASE) MCG/ACT inhaler Inhale 2 puffs into the lungs every 4 (four) hours as needed for wheezing or shortness of breath.     . bisoprolol (ZEBETA) 10 MG tablet TAKE 1 TABLET EVERY DAY 30 tablet 6  . calcium carbonate (TUMS - DOSED IN MG ELEMENTAL CALCIUM) 500 MG chewable tablet Chew 2 tablets by mouth daily as needed for indigestion or heartburn.     . calcium-vitamin D (OSCAL WITH D) 500-200 MG-UNIT per tablet Take 1 tablet by mouth daily.    . cetirizine (ZYRTEC) 10 MG tablet Take 10 mg by mouth at bedtime.     . cholecalciferol (VITAMIN D) 1000 UNITS tablet Take 1,000 Units by mouth daily.    . colchicine 0.6 MG tablet Take 0.6 mg by mouth daily as needed (gouty flare).    . DENTAGEL 1.1 % GEL dental gel Place 1 application onto teeth at bedtime.     Marland Kitchen ELIQUIS 5 MG TABS tablet TAKE 1 TABLET BY MOUTH 2 TIMES DAILY. 60 tablet 3  . febuxostat (ULORIC) 40 MG tablet Take 80 mg by mouth daily.     Marland Kitchen gabapentin (NEURONTIN) 300 MG capsule Take 300 mg by mouth 2 (two) times daily.    Marland Kitchen ipratropium-albuterol (DUONEB) 0.5-2.5 (3) MG/3ML SOLN Take 3 mLs by nebulization every 4 (four) hours as needed (for wheezing or shortness of breath).     . leflunomide (ARAVA) 20 MG tablet Take 20 mg by mouth daily.     Marland Kitchen levothyroxine (SYNTHROID, LEVOTHROID) 50 MCG tablet Take 1 tablet (50 mcg total) by mouth daily before breakfast. 90 tablet 3  . Macitentan (OPSUMIT) 10 MG TABS Take 10 mg by mouth daily. 30 tablet 6  . Magnesium 400 MG CAPS Take 1 capsule by mouth daily.    . metFORMIN (GLUCOPHAGE) 500  MG tablet Take 1 tablet (500 mg total) by mouth 2 (two) times daily with a meal. 180 tablet 3  . metolazone (ZAROXOLYN) 2.5 MG tablet Take 1 tablet (2.5 mg total) by mouth once a week. Every Wednesday and as needed 45 tablet 3  . montelukast (SINGULAIR) 10 MG tablet Take 10 mg by mouth at bedtime.    . Multiple Vitamins-Minerals (CENTRUM SILVER PO) Take 1 tablet by mouth daily.    Marland Kitchen OVER THE COUNTER MEDICATION Apply 1 application topically daily as needed (dry mouth).    . Polyvinyl Alcohol-Povidone (REFRESH OP) Place 1 drop into both eyes daily as  needed (for dry eyes).    . potassium chloride SA (KLOR-CON M20) 20 MEQ tablet Take 60 meq (3 tabs) in AM and 40 meq (2 tabs) in PM, take an extra 20 meq (1 tab) on Wed with Metolazone 120 tablet 4  . pravastatin (PRAVACHOL) 20 MG tablet TAKE 1 TABLET BY MOUTH EVERY EVENING 30 tablet 6  . predniSONE (DELTASONE) 5 MG tablet Take 5 mg by mouth daily with breakfast.    . PRESCRIPTION MEDICATION Oxygen.   Pt uses 6-8 liters of oxygen daily.    . sertraline (ZOLOFT) 50 MG tablet Take 50 mg by mouth at bedtime.    Marland Kitchen SPIRIVA HANDIHALER 18 MCG inhalation capsule INHALE 1 CAPSULE VIA HANDIHALER ONCE DAILY AT THE SAME TIME EVERY DAY 1 capsule 3  . Tadalafil, PAH, (ADCIRCA) 20 MG TABS Take 1 tablet (20 mg total) by mouth daily. 30 tablet 3  . torsemide (DEMADEX) 20 MG tablet Take 36m (4 tablets) in am and 651m(3 tablets) in pm. 180 tablet 3  . traMADol (ULTRAM) 50 MG tablet Take 100 mg by mouth as needed for moderate pain.      No current facility-administered medications for this encounter.    BP 126/74 mmHg  Pulse 110  Wt 205 lb 12.8 oz (93.35 kg)  SpO2 86% General: NAD, obese.  Neck: JVP 10-12 cm, no thyromegaly or thyroid nodule.  Lungs: Decreased breath sounds, occasional mild wheezes bilaterally. On 4 liters continuous oxygen.   CV: Nondisplaced PMI.  Heart irregular S1/S2, no S3/S4, no murmur. 1+ ankle edema bilaterally.  No carotid bruit.   Difficult to palpate pedal pulses.  Abdomen: Soft, nontender, no hepatosplenomegaly, no distention.  Skin: Intact without lesions or rashes.  Neurologic: Alert and oriented x 3.  Psych: Normal affect. Extremities: No clubbing or cyanosis.    Assessment/Plan: 1. Atrial fibrillation: Chronic x years.  She would be unlikely to remain in NSR if cardioversion were attempted.  Plan rate control.  CHADSVASC = 3 at least.  No further GI bleeding.     - Continue apixaban. CBC today.  - Continue current dose of  bisoprolol.  2. Pulmonary arterial HTN/cor pulmonale: Significant right heart failure.  Last echo in 4/16 with moderately dilated and mildly dysfunctional RV.  Most recent RHDanen 5/16 showed significantly improved PAH, PVR down to 2 WU.  PAH likely has mixed etiology: Suspect that there is a Group I PAH component from rheumatoid arthritis, but patient also has COPD and OHS/OSA.  FEV1 is 65%, so the degree of pulmonary HTN does seem out of proportion to COPD alone.  Given suspected component of Group I PAH, I started her on Tyvaso but she did not tolerate this.  Currently on Adcirca and Opsumit with good RHC results in 5/16 as above.  - Continue Adcirca and Opsumit at current doses.  Have room to increase Adcirca in the future if needed.    3. CKD: Creatinine stable recently.  BMET today.  4. RA: Being treated (Dr. BeAmil Amen  Stable joint symptoms.  5. Chronic diastolic CHF with prominent RV failure:  NYHA III.  She is volume overloaded today, likely related to recent dietary indiscretion. - Increase torsemide to 80 mg bid x 3 days, then back to 80 qam/60 qpm.  Take metolazone Tuesday and Wednesday of this week, then continue metolazone once a week. BMET/BNP today, BMET in 1 week.   - Increase KCl to 60 mEq qam, 40 mEq qpm. Take extra 20 mEq KCl on  metolazone days.  6. AAA: 3.5 cm on CT in 7/16.  She will need abdominal US in 7/18 to follow.    [name] 06/08/2015

## 2015-06-09 ENCOUNTER — Other Ambulatory Visit (HOSPITAL_COMMUNITY): Payer: Self-pay | Admitting: *Deleted

## 2015-06-09 DIAGNOSIS — M1A09X1 Idiopathic chronic gout, multiple sites, with tophus (tophi): Secondary | ICD-10-CM | POA: Diagnosis not present

## 2015-06-09 DIAGNOSIS — Z79899 Other long term (current) drug therapy: Secondary | ICD-10-CM | POA: Diagnosis not present

## 2015-06-09 DIAGNOSIS — M150 Primary generalized (osteo)arthritis: Secondary | ICD-10-CM | POA: Diagnosis not present

## 2015-06-09 DIAGNOSIS — I5032 Chronic diastolic (congestive) heart failure: Secondary | ICD-10-CM

## 2015-06-09 DIAGNOSIS — M0589 Other rheumatoid arthritis with rheumatoid factor of multiple sites: Secondary | ICD-10-CM | POA: Diagnosis not present

## 2015-06-09 DIAGNOSIS — G629 Polyneuropathy, unspecified: Secondary | ICD-10-CM | POA: Diagnosis not present

## 2015-06-09 MED ORDER — MACITENTAN 10 MG PO TABS: 10.0000 mg | ORAL_TABLET | Freq: Every day | ORAL | Status: AC

## 2015-06-10 ENCOUNTER — Telehealth: Payer: Self-pay | Admitting: Emergency Medicine

## 2015-06-10 DIAGNOSIS — J449 Chronic obstructive pulmonary disease, unspecified: Secondary | ICD-10-CM | POA: Diagnosis not present

## 2015-06-11 NOTE — Telephone Encounter (Signed)
Spoke with pt, requesting a rx [place]anks and a [place] to [employer] in [facility]- verified below fax #. RB are you ok with this order?  Thanks!

## 2015-06-12 NOTE — Telephone Encounter (Signed)
Yes thanks, please order whatever she needs with O2 same O2 dose as her existing orders.

## 2015-06-12 NOTE — Telephone Encounter (Signed)
Spoke with pt and advised that order was placed for oxygen while in [place].

## 2015-06-15 ENCOUNTER — Telehealth: Payer: Self-pay | Admitting: Emergency Medicine

## 2015-06-15 ENCOUNTER — Ambulatory Visit (HOSPITAL_COMMUNITY)
Admission: RE | Admit: 2015-06-15 | Discharge: 2015-06-15 | Disposition: A | Discharge status: Home / Self Care | Payer: Medicare Other | Source: Ambulatory Visit | Attending: Cardiology | Admitting: Cardiology

## 2015-06-15 DIAGNOSIS — I272 Other secondary pulmonary hypertension: Secondary | ICD-10-CM | POA: Insufficient documentation

## 2015-06-15 DIAGNOSIS — I5022 Chronic systolic (congestive) heart failure: Secondary | ICD-10-CM

## 2015-06-15 LAB — COMPREHENSIVE METABOLIC PANEL
ALT: 25 U/L (ref 14–54)
ANION GAP: 12 (ref 5–15)
AST: 31 U/L (ref 15–41)
Albumin: 3.1 g/dL — ABNORMAL LOW (ref 3.5–5.0)
Alkaline Phosphatase: 155 U/L — ABNORMAL HIGH (ref 38–126)
BUN: 38 mg/dL — ABNORMAL HIGH (ref 6–20)
CHLORIDE: 99 mmol/L — AB (ref 101–111)
CO2: 29 mmol/L (ref 22–32)
CREATININE: 1.51 mg/dL — AB (ref 0.44–1.00)
Calcium: 8.7 mg/dL — ABNORMAL LOW (ref 8.9–10.3)
GFR, EST AFRICAN AMERICAN: 40 mL/min — AB (ref >60)
GFR, EST NON AFRICAN AMERICAN: 34 mL/min — AB (ref >60)
Glucose, Bld: 184 mg/dL — ABNORMAL HIGH (ref 65–99)
Potassium: 4.4 mmol/L (ref 3.5–5.1)
SODIUM: 140 mmol/L (ref 135–145)
Total Bilirubin: 0.4 mg/dL (ref 0.3–1.2)
Total Protein: 6.5 g/dL (ref 6.5–8.1)

## 2015-06-15 LAB — URIC ACID: URIC ACID, SERUM: 8.4 mg/dL — AB (ref 2.3–6.6)

## 2015-06-15 NOTE — Telephone Encounter (Signed)
Called and spoke to pt. Pt stated she spoke to [facility] and they stated they did not receive the order for POC and E tanks. Rodena Piety faxed the previous records and had the fax number. Spoke with Rodena Piety, [facility], she is re-faxing the rx and recs to the appropriate number. Pt verbalized understanding and denied any further questions or concerns at this time.

## 2015-06-15 NOTE — Progress Notes (Signed)
Order from [facility] Order: CMP and Uric Acid DX mo5.89,Z79.899, [id number] Fax results to [phone]

## 2015-06-15 NOTE — Addendum Note (Signed)
Encounter addended by: Kerry Dory, CMA on: 06/15/2015 10:30 AM<BR>     Documentation filed: [employer]x [employer], Notes Section, Orders

## 2015-06-16 ENCOUNTER — Telehealth: Payer: Self-pay | Admitting: Emergency Medicine

## 2015-06-16 NOTE — Telephone Encounter (Signed)
Spoke with Alroy Dust at the [employer], states that he does not see any signatures on the prescription that was faxed to the office.  States that before this order can be placed a rx has to be written on an actual script pad with provider's signature.   [phone] attn: Neena Rhymes order from the Ambulatory referral in pt's chart was copied on to a script pad and faxed to above fax #.  Nothing further needed.

## 2015-06-22 ENCOUNTER — Encounter (HOSPITAL_COMMUNITY): Payer: Self-pay | Admitting: *Deleted

## 2015-06-27 DIAGNOSIS — J449 Chronic obstructive pulmonary disease, unspecified: Secondary | ICD-10-CM | POA: Diagnosis not present

## 2015-06-30 ENCOUNTER — Other Ambulatory Visit: Payer: Self-pay | Admitting: Emergency Medicine

## 2015-07-07 ENCOUNTER — Encounter: Payer: Self-pay | Admitting: Emergency Medicine

## 2015-07-07 ENCOUNTER — Ambulatory Visit (INDEPENDENT_AMBULATORY_CARE_PROVIDER_SITE_OTHER): Payer: Medicare Other | Admitting: Emergency Medicine

## 2015-07-07 VITALS — BP 112/64 | HR 89 | Ht 64.0 in | Wt 209.0 lb

## 2015-07-07 DIAGNOSIS — G2581 Restless legs syndrome: Secondary | ICD-10-CM | POA: Diagnosis not present

## 2015-07-07 DIAGNOSIS — Z23 Encounter for immunization: Secondary | ICD-10-CM | POA: Diagnosis not present

## 2015-07-07 DIAGNOSIS — J9611 Chronic respiratory failure with hypoxia: Secondary | ICD-10-CM

## 2015-07-07 DIAGNOSIS — IMO0002 Reserved for concepts with insufficient information to code with codable children: Secondary | ICD-10-CM

## 2015-07-07 DIAGNOSIS — I5032 Chronic diastolic (congestive) heart failure: Secondary | ICD-10-CM

## 2015-07-07 DIAGNOSIS — J449 Chronic obstructive pulmonary disease, unspecified: Secondary | ICD-10-CM | POA: Diagnosis not present

## 2015-07-07 DIAGNOSIS — I272 Other secondary pulmonary hypertension: Secondary | ICD-10-CM

## 2015-07-07 NOTE — Assessment & Plan Note (Addendum)
Continue Advair, Spiriva, DuoNeb when necessary. Flu shot today

## 2015-07-07 NOTE — Patient Instructions (Signed)
Please continue your current medications as you are taking them  Continue oxygen through oximizer at 4-8L/min  Flu Shot today Follow with Dr Lamonte Sakai in 4 months or sooner if you have any problems.

## 2015-07-07 NOTE — Assessment & Plan Note (Signed)
Continue adirca, opsumit. We will renew her [employer] support in late November

## 2015-07-07 NOTE — Assessment & Plan Note (Signed)
On gabapentin

## 2015-07-07 NOTE — Progress Notes (Signed)
Subjective:  Patient ID: Tracey Duncan, female    DOB: [birth year], 69 y.o.   MRN: [id number] HPI 69 yo woman, former tobacco (34 pk-yrs), hx of OSA dx in 4/13 (on CPAP + O2), HTN, A fib (failed cardioversion), allergies. COPD dx in 2002, currently on Advair + Spiriva. Albuterol prn, uses it only . Since moving to [place] in last 6 months she has had more SOB. She is having occasional wheeze, no cough. No CP.  Significant hypoxemia on arrival. Needs a local PCP and cardiologist  ROV 68/25/14 -- COPD, OSA, allergies, A Fib, HTN. She was seen by Dr Aundra Dubin and started anticoagulation for her A fib. Also had TTE that showed PAH with PASP ~ 8mHg. She is wearing her O2 reliably - has a documented desat on O2 at Dr MClaris Gladdenoffice. She is ordered for 6L and 8L with exertion.  Currently on advair, spiriva. Singulair + zyrtec.   ROV 68/2/14 -- COPD, OSA, allergies, A Fib, HTN. Also secondary PAH and severe hypoxemia.  PFT today with moderately severe AFL, no BD response, decreased DLCO. A V/Q on 11/21/12 was normal. Her lasix has been adjusted by Dr MAundra Dubin She is using her oximizer reliably. She is trying to wear CPAP reliably, but still poor sleep - difficulty falling asleep and also staying asleep. Supposed to have ONO on CPAP 4/3.  ROV 68/14/14 --  COPD (FEV1 1.38L), OSA on CPAP, allergies, A Fib, HTN. Also secondary PAH and severe hypoxemia.  Returns for f/u. Reports that her wt has dropped another 8 lbs since our last visit,  breathing well. ONO on CPAP + 3L/min performed > shows 3 discrete episodes of desat that could represent REM-related desats (the report says 2, but it was really 3L/min). Unfortunately about a week ago she developed R UE, hand pain, L knee pain. No f/c, no medication changes or other infectious sx. She follows w [facility].   PULMONARY FUNCTON TEST 12/05/2012  FVC 2.15  FEV1 1.38  FEV1/FVC 64.2  FVC  % Predicted 73  FEV % Predicted 65  FeF 25-75 .65  FeF 25-75 % Predicted 2.44       68/24/14 -- COPD, OSA, newly dx RA. Secondary PAH and hypoxemia. She was started on prednisone, now weaning and being converted to [place] She feels MUCH better, is more active. She is compliant with her oximizer. She wears her CPAP. Compliance data shows that she wears it, she states that it helps her clinically.  She is having more nasal drainage, cough with clear sputum.   68/22/15 -- COPD, OSA, newly dx RA. Secondary PAH and hypoxemia. She tells me that her breathing has been worse over the last several months. R heart cath (11/14): mean RA 8, PA 68/28 mean 45, mean PCWP 11, CI 2.29, PVR 7.2 WU. She was started on Tyvaso by Dr MAundra Dubin has titrated up to 9 puffs qid. She unfortunately feels worse not better, is now limited as to her activity, cannot walk any significant distance (a clear change). She also has now weaned off of prednisone and is on [place] which could be an influence here. She is on Advair and Spiriva.   ROV 68/11/15 -- COPD, OSA, RA, A Fib, secondary PAH and hypoxemia. She has been on Tyvaso since 12/17, 9 puffs since January. She is also on Arava and Pred 5. She feels that the last several months have been characterized by worsening dyspnea, some worsening edema > her lasix has been increased by Dr MAundra Dubinwith  some improvement in edema. Her rheumatolgist believes that she could be off pred from their standpoint. She remains on Spiriva + Advair. She remains on CPAP - but hasn't used it last several weeks due to cough. She has not had repeat R heart cath or TTE on Tyvaso yet. She is getting purulent mucous from her nose, resulting in cough. She is on zyrtec and sigulair.    ROV 68/26/15 -- COPD, OSA, RA, A Fib, secondary PAH and hypoxemia; she is managed with arava + pred. She is now off Tyvaso and she feels better off of it. She has some cough, nagging an prod of white. She has not been wearing her CPAP reliably - has lost 100 lbs since her PSG.    R heart cath 02/06/14 -- Final Conclusions:  RA pressure and PCWP look good, still with moderate pulmonary hypertension. This study really shows no significant change compared to the pre-Tyvaso RHC. I suspect that patient may have [place] primarily due to COPD and OHS/OSA rather than group I PAH.  Recommendations: Will have her hold Tyvaso and see if this makes her feel any worse. If not, will probably stop it.   ROV 68/24/15 -- hx COPD, OSA, RA, A fib, hypoxemia and secondary PAH. Last time we ordered split night PSG, scheduled for 06/23/14. She is on high-flow O2. Feels well - has been active.  She asks today whether she can / should get Tdap immunization, prevnar-13  ROV 68/4/15 -- hx COPD, OSA, RA, A fib, hypoxemia and secondary PAH on opsumit. Her PSG showed hypoxemia without obstructive events, evidence for limb movement disorder.  For the last 5 days she has had increasing congestion, mucous from head and chest. No clear fever.  We discussed her limb movement - she wants to hold off on meds for this now.   ROV 68/21/16 -- hx COPD, nocturnal hypoxemia and restless leg syndrome, RA, A fib, hypoxemia and secondary PAH on opsumit. She was seen by Dr. Melvyn Novas in January.  Her lasix was changed to torsemide about 2 weeks ago. Her wt has gone up about 10-15 lbs, some of this likely fluid. She was more SOB for the last 2 weeks and increased her albuterol use. She remains on opsumit, just started adcirca 4/21. Her last TTE was 12/11/14 as below. Remains  on advair + spiriva, zyrtec, singulair. She is on prednisone 10m     ROV 68/26/16 -- follow-up visit for COPD, rheumatoid arthritis, age of fibrillation and secondary pulmonary hypertension. She's been dealing with volume overload, lower extremity pain and possible restless leg syndrome. She is maintained on Adcirca, opsumit and eliquis, also on Spiriva and Advair for her obstructive lung disease. She was unable to tolerate tyvaso. She is on chronic prednisolone and leflunomide. Her allergy regimen includes  Singulair and Zyrtec.  She was hospitalized for volume overload in May, underwent diuresis. She saw Dr MAundra Dubinyesterday and torsemide was temporarily increased. She underwent a CT abdomen for BRBPR 03/16/15 that was reassuring. The bleeding stopped after eliquis was held. She has tolerated Spiriva and Advair. Her exercise tolerance is better, did a 6 minute walk 7/25.   ROV 07/07/15 -- follow-up visit for secondary pulmonary hypertension in the setting of COPD, rheumatoid arthritis, atrial fibrillation. Allergic rhinitis and possible restless leg syndrome.  She is on Arava and gabapentin, pred 562m  She is on Opsumit and adHong Kong[place]semide being managed by Dr McAundra DubinShe is on spiriva + advair, uses oximizer 4-8L/min. She is able to  get out, able to walk with some limitation.  Her edema has been stable since her torsemide was increased in October.  Uses duoneb rarely.       Objective:   Physical Exam Filed Vitals:   07/07/15 1337  BP: 112/64  Pulse: 89  Height: _0  (1.626 m)  Weight: 209 lb (94.802 kg)  SpO2: 91%   Gen: Pleasant, obese, in no distress,  normal affect  ENT: No lesions,  mouth clear,  oropharynx clear, no postnasal drip  Neck: No JVD, no TMG, no carotid bruits  Lungs: No use of accessory muscles, clear without rales or rhonchi  Cardiovascular: RRR, heart sounds normal, no murmur or gallops, 1+ peripheral edema  Musculoskeletal: No deformities, no cyanosis or clubbing  Neuro: alert, non focal  Skin: Warm, no lesions or rashes   R heart cath 07/11/13  -- RA mean 8  RV 70/7  PA 68/28, mean 45  PCWP mean 11  Oxygen saturations (on 5L home oxygen):  PA 64%  AO 99%  Cardiac Output (Fick) 4.74  Cardiac Index (Fick) 2.29  PVR 7.2 WU   12/11/14 --  Study Conclusions - Left ventricle: The cavity size was normal. Wall thickness was normal. Systolic function was normal. The estimated ejection fraction was in the range of 55% to 60%. Indeterminant  diastolic function (atrial fibrillation). Wall motion was normal; there were no regional wall motion abnormalities. - Mitral valve: Moderately calcified annulus. There was mild regurgitation. - Left atrium: The atrium was severely dilated. - Right ventricle: The cavity size was moderately dilated. Systolic function was mildly reduced. - Right atrium: The atrium was severely dilated. - Tricuspid valve: Peak RV-RA gradient (S): 58 mm Hg. - Pulmonary arteries: PA peak pressure: 73 mm Hg (S). - Systemic veins: IVC measured 2.5 cm with < 50% respirophasic variation, suggesting RA pressure 15 mmHg.      Assessment & Plan:  COPD GOLD II  Continue Advair, Spiriva, DuoNeb when necessary. Flu shot today  Secondary pulmonary hypertension Continue adirca, opsumit. We will renew her [employer] support in late November  RLS (restless legs syndrome) On gabapentin  Chronic hypoxemic respiratory failure (HCC) Continue her current oxygen at 4-8 L/m via Oxymizer  Chronic diastolic CHF (congestive heart failure) Stable on current dose of torsemide and metolazone

## 2015-07-07 NOTE — Assessment & Plan Note (Signed)
Stable on current dose of torsemide and metolazone

## 2015-07-07 NOTE — Assessment & Plan Note (Signed)
Continue her current oxygen at 4-8 L/m via Oxymizer

## 2015-07-08 ENCOUNTER — Ambulatory Visit (HOSPITAL_COMMUNITY)
Admission: RE | Admit: 2015-07-08 | Discharge: 2015-07-08 | Disposition: A | Discharge status: Home / Self Care | Payer: Medicare Other | Source: Ambulatory Visit | Attending: Cardiology | Admitting: Cardiology

## 2015-07-08 VITALS — BP 110/60 | HR 91 | Wt 211.0 lb

## 2015-07-08 DIAGNOSIS — E785 Hyperlipidemia, unspecified: Secondary | ICD-10-CM | POA: Diagnosis not present

## 2015-07-08 DIAGNOSIS — I2781 Cor pulmonale (chronic): Secondary | ICD-10-CM | POA: Diagnosis not present

## 2015-07-08 DIAGNOSIS — I714 Abdominal aortic aneurysm, without rupture: Secondary | ICD-10-CM | POA: Insufficient documentation

## 2015-07-08 DIAGNOSIS — E039 Hypothyroidism, unspecified: Secondary | ICD-10-CM | POA: Insufficient documentation

## 2015-07-08 DIAGNOSIS — I482 Chronic atrial fibrillation: Secondary | ICD-10-CM | POA: Insufficient documentation

## 2015-07-08 DIAGNOSIS — Z7902 Long term (current) use of antithrombotics/antiplatelets: Secondary | ICD-10-CM | POA: Diagnosis not present

## 2015-07-08 DIAGNOSIS — Z79899 Other long term (current) drug therapy: Secondary | ICD-10-CM | POA: Diagnosis not present

## 2015-07-08 DIAGNOSIS — J449 Chronic obstructive pulmonary disease, unspecified: Secondary | ICD-10-CM | POA: Insufficient documentation

## 2015-07-08 DIAGNOSIS — E669 Obesity, unspecified: Secondary | ICD-10-CM | POA: Diagnosis not present

## 2015-07-08 DIAGNOSIS — Z8249 Family history of ischemic heart disease and other diseases of the circulatory system: Secondary | ICD-10-CM | POA: Diagnosis not present

## 2015-07-08 DIAGNOSIS — Z87891 Personal history of nicotine dependence: Secondary | ICD-10-CM | POA: Diagnosis not present

## 2015-07-08 DIAGNOSIS — I272 Other secondary pulmonary hypertension: Secondary | ICD-10-CM

## 2015-07-08 DIAGNOSIS — I5032 Chronic diastolic (congestive) heart failure: Secondary | ICD-10-CM | POA: Diagnosis not present

## 2015-07-08 DIAGNOSIS — N189 Chronic kidney disease, unspecified: Secondary | ICD-10-CM | POA: Diagnosis not present

## 2015-07-08 DIAGNOSIS — M069 Rheumatoid arthritis, unspecified: Secondary | ICD-10-CM | POA: Insufficient documentation

## 2015-07-08 DIAGNOSIS — Z7984 Long term (current) use of oral hypoglycemic drugs: Secondary | ICD-10-CM | POA: Diagnosis not present

## 2015-07-08 DIAGNOSIS — I130 Hypertensive heart and chronic kidney disease with heart failure and stage 1 through stage 4 chronic kidney disease, or unspecified chronic kidney disease: Secondary | ICD-10-CM | POA: Insufficient documentation

## 2015-07-08 MED ORDER — METOLAZONE 2.5 MG PO TABS: ORAL_TABLET | ORAL | Status: DC

## 2015-07-08 NOTE — Patient Instructions (Signed)
Medications: For 3 days take Torsemide 41m in the am and 80 mg the evening then back to normal dose  Extra Metolazone 2.5 mg on Wednesday and Thursdays  Take extra Potassium 20 meq for 2 days  Labs: Labs next week  Procedure: 6 minute walk at your next visit in 4 weeks  Follow up: We will see you back in 4 weeks

## 2015-07-08 NOTE — Progress Notes (Signed)
Advanced Heart Failure Medication Review by a Pharmacist  Does the patient  feel that his/her medications are working for him/her?  yes  Has the patient been experiencing any side effects to the medications prescribed?  no  Does the patient measure his/her own blood pressure or blood glucose at home?  yes   Does the patient have any problems obtaining medications due to transportation or finances?   no  Understanding of regimen: good Understanding of indications: good Potential of compliance: good Patient understands to avoid NSAIDs. Patient understands to avoid decongestants.  Issues to address at subsequent visits: None   Pharmacist comments:  Tracey Duncan is a pleasant 69 yo F presenting without a medication list but with excellent recall of each of her medications including dosages. She reports good compliance with her medications and states that she needed an additional dose of metolazone last week for weight gain after her trip to the beach. She did not have any specific medication-related questions or concerns for me at this time.   [name]. [name], PharmD, BCPS, CPP Clinical Pharmacist Pager: [phone] Phone: [phone] 07/08/2015 9:33 AM      Time with patient: 6 minutes Preparation and documentation time: 2 minutes Total time: 8 minutes

## 2015-07-08 NOTE — Progress Notes (Signed)
Patient ID: Tracey Duncan, female   DOB: [birth year], 69 y.o.   MRN: [id number]   PCP: [facility] Pulmonary: Dr Lamonte Sakai Primary HF: Dr Haroldine Laws  69 yo with history of COPD on home oxygen, OSA (? OHS/OSA), rheumatoid arthritis, and chronic atrial fibrillation presents for cardiology followup.  She was diagnosed with atrial fibrillation around 2004. She failed cardioversion and it has been chronic.  She has noted exertional dyspnea since at least 2012.  She was diagnosed with COPD back in 2002.  She no longer smokes.  She had been on CPAP for OSA prior, but was started on home oxygen all the time by Dr. Lamonte Sakai.  No tachypalpitations, no chest pain.  Echo in 2/14 showed a moderately dilated RV with moderately decreased systolic function, PA systolic pressure 65 mmHg. She was started on apixaban.   V/Q scan was negative for chronic PE.  She has been diagnosed with rheumatoid arthritis and is on [name].  She had a right heart cath in 11/14 that confirmed moderate to severe PAH with PVR 7.4 WU.  In 12/14, was started on Tyvaso. She was changed to Opsumit when [place] in 2015 was nearly identical to previous study in 07/2013.  Adcirca also added.   During a HF office visit 01/20/15, she c/o DOE with minimal activity despite increased dose of Lasix and decrease weight. Also had productive cough with green sputum, no fever. She was noted to be hypoxic and fluid overloaded and was admitted on for IV diuresis.  RHC after diuresis showed nearly normal filling pressures and PA pressure 44/19 (decreased from prior).    Had CT abdomen/pelvis w/o contrast with no cause for bleeding noted.   She returns for follow up. Overall feeling ok. Ongoing dyspnea with exertion. At home she is on 6 liters oxygen at rest and 8 liters with exertion. Sleeps on 2 pillows at night chronically. Denies syncope/presyncope. Weight at home 206 pounds. Took extra metolazone yesterday due to weight gain. Thinks weight is up due to recent vacation.   Ambulates with a rolling walker. Works a little for an [employer] firm. Lives alone. Taking all medications.    Labs (4/14): RF < 10, ANA negative, BNP 294, K 3.8, creatinine 1.7  Labs (9/14): K 4.5, creatinine 1.74 Labs (11/14): K 4.5=>3.9, creatinine 1.6, BNP 300 Labs (2/15): K 4.3, creatinine 1.6=>1.5, BNP 383=>311, LDL 72, HDL 49 Labs (3/15): K 4.2, creatinine 1.5 Labs (5/16) K 3.7, creatinine 1.6 Labs (02/12/2015): K 3.8 Creatinine 1.79  Labs (7/16): K 3.9, creatinine 1.75, HCT 30.7 Labs (06/15/2015) : K 4,4 Creatinine 1.51   PMH: 1. OSA/OHS: On CPAP at night and oxygen during the day. 2. COPD: Diagnosed 2002.  FEV1 65% predicted in 4/14.  3. HTN 4. Chronic atrial fibrillation: Failed DCCV in 12/04.   5. 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  Echo (2/14) with EF 55-60%, moderately dilated RV with moderately decreased systolic function, PA systolic pressure 65 mmHg.  V/Q scan in 3/14 was negative for chronic PE. PFTs (4/14): FEV1 65% predicted, mild obstructive defect with severely decreased DLCO.  RHC (11/14): mean RA 8, PA 68/28 mean 45, mean PCWP 11, CI 2.29, PVR 7.2 WU. 6 minute walk (2/15) 126 meters.  6 minute walk (3/15) 118 m.  Echo (4/16) with EF 55-60%, moderately dilated RV with mildly decreased systolic function, PASP 73 mmHg.  RHC (5/16) with mean RA 7, PA 44/19 mean 31, mean PCWP 18, CI 3.66, PVR  2 WU. She did not tolerate Tyvaso.  She is currently taking Adcirca and Opsumit.  6 minute walk (7/16) 172 meters.  6. Lexiscan Thallium in 11/11 with no evidence for ischemia or infarction.  7. Impaired fasting glucose. 8. Hypothyroidism 9. Obesity. 10. CKD 11. Rheumatoid arthritis 12. ABIs (11/14): Normal 13. Hyperlipidemia: Myalgias with Crestor.  14. Depression 15. Chronic diastolic CHF 16. Umbilical hernia 17. AAA: 3.5 cm on 7/16 CT abdomen.  18. Rectal bleeding: 6/16.  CT abdomen did not show anything that would be likely to  cause bleeding.  19. ABIs (8/16): Normal.   SH: Widow, moved to [place] from [place] to live with sister.  Quit smoking in 1993.  Rare ETOH. Retired Optometrist.   FH: Brother with MI at 34, father with MI at 15.  ROS: All systems reviewed and negative except as per HPI.   Current Outpatient Prescriptions  Medication Sig Dispense Refill  . acetaminophen (TYLENOL) 500 MG tablet Take 1,000 mg by mouth daily as needed (for arthritic pain).    . ADVAIR DISKUS 250-50 MCG/DOSE AEPB INHALE 1 PUFF INTO THE LUNGS EVERY 12 (TWELVE) HOURS. 60 each 5  . albuterol (PROAIR HFA) 108 (90 BASE) MCG/ACT inhaler Inhale 2 puffs into the lungs every 4 (four) hours as needed for wheezing or shortness of breath.     . bisoprolol (ZEBETA) 10 MG tablet TAKE 1 TABLET EVERY DAY 30 tablet 6  . calcium-vitamin D (OSCAL WITH D) 500-200 MG-UNIT per tablet Take 1 tablet by mouth daily.    . cetirizine (ZYRTEC) 10 MG tablet Take 10 mg by mouth at bedtime.     . cholecalciferol (VITAMIN D) 1000 UNITS tablet Take 1,000 Units by mouth daily.    . DENTAGEL 1.1 % GEL dental gel Place 1 application onto teeth at bedtime.     Marland Kitchen ELIQUIS 5 MG TABS tablet TAKE 1 TABLET BY MOUTH 2 TIMES DAILY. 60 tablet 3  . Febuxostat (ULORIC) 80 MG TABS Take 80 mg by mouth daily.    Marland Kitchen gabapentin (NEURONTIN) 300 MG capsule Take 300 mg by mouth 2 (two) times daily.    Marland Kitchen ipratropium-albuterol (DUONEB) 0.5-2.5 (3) MG/3ML SOLN Take 3 mLs by nebulization every 4 (four) hours as needed (for wheezing or shortness of breath).     . leflunomide (ARAVA) 20 MG tablet Take 20 mg by mouth daily.     Marland Kitchen levothyroxine (SYNTHROID, LEVOTHROID) 50 MCG tablet Take 1 tablet (50 mcg total) by mouth daily before breakfast. 90 tablet 3  . Macitentan (OPSUMIT) 10 MG TABS Take 10 mg by mouth daily. 30 tablet 6  . Magnesium 400 MG CAPS Take 1 capsule by mouth daily.    . metFORMIN (GLUCOPHAGE) 500 MG tablet Take 1 tablet (500 mg total) by mouth 2 (two) times daily with a meal.  180 tablet 3  . metolazone (ZAROXOLYN) 2.5 MG tablet Take 1 tablet (2.5 mg total) by mouth once a week. Every Wednesday and as needed 45 tablet 3  . montelukast (SINGULAIR) 10 MG tablet Take 10 mg by mouth at bedtime.    . Multiple Vitamins-Minerals (CENTRUM SILVER PO) Take 1 tablet by mouth daily.    Marland Kitchen OVER THE COUNTER MEDICATION Apply 1 application topically daily as needed (dry mouth).    . Polyvinyl Alcohol-Povidone (REFRESH OP) Place 1 drop into both eyes daily as needed (for dry eyes).    . potassium chloride SA (KLOR-CON M20) 20 MEQ tablet Take 60 meq (3 tabs) in AM and  40 meq (2 tabs) in PM, take an extra 20 meq (1 tab) on Wed with Metolazone 120 tablet 4  . pravastatin (PRAVACHOL) 20 MG tablet TAKE 1 TABLET BY MOUTH EVERY EVENING 30 tablet 6  . predniSONE (DELTASONE) 5 MG tablet Take 5 mg by mouth daily with breakfast.    . PRESCRIPTION MEDICATION Oxygen.   Pt uses 6-8 liters of oxygen daily.    . sertraline (ZOLOFT) 50 MG tablet Take 50 mg by mouth at bedtime.    Marland Kitchen SPIRIVA HANDIHALER 18 MCG inhalation capsule INHALE 1 CAPSULE VIA HANDIHALER ONCE DAILY AT THE SAME TIME EVERY DAY 1 capsule 3  . Tadalafil, PAH, (ADCIRCA) 20 MG TABS Take 1 tablet (20 mg total) by mouth daily. 30 tablet 3  . Throat Lozenges (SALESE MT) Use as directed 1 lozenge in the mouth or throat daily as needed (dry mouth).    . torsemide (DEMADEX) 20 MG tablet Take 3m (4 tablets) in am and 649m(3 tablets) in pm. 180 tablet 3  . traMADol (ULTRAM) 50 MG tablet Take 100 mg by mouth as needed for moderate pain.     . Marland Kitchenolchicine 0.6 MG tablet Take 0.6 mg by mouth daily as needed (gouty flare).     No current facility-administered medications for this encounter.    BP 110/60 mmHg  Pulse 91  Wt 211 lb (95.709 kg)  SpO2 94% General: NAD, obese.  Neck: JVP to jaw  cm, no thyromegaly or thyroid nodule.  Lungs: Decreased breath sounds, occasional mild wheezes bilaterally. On 4 liters continuous oxygen.   CV:  Nondisplaced PMI.  Heart irregular S1/S2, no S3/S4, no murmur. 2+ ankle edema bilaterally.  No carotid bruit.  Difficult to palpate pedal pulses.  Abdomen: Soft, nontender, no hepatosplenomegaly, + distended.  Skin: Intact without lesions or rashes.  Neurologic: Alert and oriented x 3.  Psych: Normal affect. Extremities: No clubbing or cyanosis.    Assessment/Plan: 1. Atrial fibrillation: Chronic x years.  She would be unlikely to remain in NSR if cardioversion were attempted.  Plan rate control.  CHADSVASC = 3 at least.  No further GI bleeding.    Rate controlled today - Continue apixaban.  - Continue current dose of  bisoprolol.  2. Pulmonary arterial HTN/cor pulmonale: Significant right heart failure.  Last echo in 4/16 with moderately dilated and mildly dysfunctional RV.  Most recent [place] 5/16 showed significantly improved PAH, PVR down to 2 WU.  PAH likely has mixed etiology: Suspect that there is a Group I PAH component from rheumatoid arthritis, but patient also has COPD and OHS/OSA.  FEV1 is 65%, so the degree of pulmonary HTN does seem out of proportion to COPD alone.  Given suspected component of Group I PAH,  started her on Tyvaso but she did not tolerate this.  Currently on Adcirca and Opsumit with good RHC results in 5/16 as above.  - Continue Adcirca and Opsumit at current doses.  Have room to increase Adcirca in the future if needed.    3. CKD: Creatinine stable recently.   4. RA: Being treated (Dr. BeAmil Amen  Stable joint symptoms.  5. Chronic diastolic CHF with prominent RV failure:  NYHA III.  She is volume overloaded today, likely related to recent dietary indiscretion. - Increase torsemide to 80 mg bid x 3 days, then back to 80 qam/60 qpm.  Take metolazone Wednesday and  Thursday this week then back to every Wednesday.  She will take an extra potassium today and tomorrow.  6. AAA: 3.5 cm on CT in 7/16.  She will need abdominal US in 7/18 to follow.   Follow up in 4 weeks.  Plan to check 6MW. Consider increasing adcirca at that time.    Amy Clegg NP-C  07/08/2015  Patient seen and examined with Darrick Grinder, NP. We discussed all aspects of the encounter. I agree with the assessment and plan as stated above.   She is volume overloaded in the setting of dietary indiscretion. Will increase torsemide to 80 mg bid x 3 days, then back to 80 qam/60 qpm.  Take metolazone Wednesday and  Thursday this week then back to every Wednesday. Reinforced need for daily weights and reviewed use of sliding scale diuretics.   Bensimhon, Daniel,MD 3:51 PM

## 2015-07-11 DIAGNOSIS — J449 Chronic obstructive pulmonary disease, unspecified: Secondary | ICD-10-CM | POA: Diagnosis not present

## 2015-07-15 ENCOUNTER — Ambulatory Visit (HOSPITAL_COMMUNITY)
Admission: RE | Admit: 2015-07-15 | Discharge: 2015-07-15 | Disposition: A | Discharge status: Home / Self Care | Payer: Medicare Other | Source: Ambulatory Visit | Attending: Internal Medicine | Admitting: Internal Medicine

## 2015-07-15 ENCOUNTER — Telehealth (HOSPITAL_COMMUNITY): Payer: Self-pay | Admitting: Pharmacist

## 2015-07-15 DIAGNOSIS — I5032 Chronic diastolic (congestive) heart failure: Secondary | ICD-10-CM | POA: Diagnosis not present

## 2015-07-15 LAB — BASIC METABOLIC PANEL
Anion gap: 10 (ref 5–15)
BUN: 30 mg/dL — AB (ref 6–20)
CHLORIDE: 99 mmol/L — AB (ref 101–111)
CO2: 32 mmol/L (ref 22–32)
CREATININE: 1.7 mg/dL — AB (ref 0.44–1.00)
Calcium: 9.3 mg/dL (ref 8.9–10.3)
GFR calc Af Amer: 35 mL/min — ABNORMAL LOW (ref >60)
GFR calc non Af Amer: 30 mL/min — ABNORMAL LOW (ref >60)
Glucose, Bld: 143 mg/dL — ABNORMAL HIGH (ref 65–99)
Potassium: 4.3 mmol/L (ref 3.5–5.1)
SODIUM: 141 mmol/L (ref 135–145)

## 2015-07-15 NOTE — Telephone Encounter (Signed)
Opsumit PA re-approved from 07/15/15-07/14/16 through [facility].  [name]. [name], PharmD, BCPS, CPP Clinical Pharmacist Pager: [phone] Phone: [phone] 07/15/2015 4:39 PM

## 2015-07-16 ENCOUNTER — Other Ambulatory Visit: Payer: Self-pay

## 2015-07-16 NOTE — Telephone Encounter (Signed)
Approved      Disp Refills Start End    Tadalafil, PAH, (ADCIRCA) 20 MG TABS 30 tablet 3 04/23/2015     Sig - Route:  Take 1 tablet (20 mg total) by mouth daily. - Oral    Class:  No Print    DAW:  No    Comment:  Faxed to [facility] Provider:  Larey Dresser, MD    Ordering User:  Patton Salles, RN

## 2015-07-28 DIAGNOSIS — J449 Chronic obstructive pulmonary disease, unspecified: Secondary | ICD-10-CM | POA: Diagnosis not present

## 2015-08-03 ENCOUNTER — Other Ambulatory Visit: Payer: Self-pay | Admitting: Cardiology

## 2015-08-04 ENCOUNTER — Other Ambulatory Visit (HOSPITAL_COMMUNITY): Payer: Self-pay | Admitting: Cardiology

## 2015-08-07 ENCOUNTER — Ambulatory Visit (HOSPITAL_COMMUNITY)
Admission: RE | Admit: 2015-08-07 | Discharge: 2015-08-07 | Disposition: A | Discharge status: Home / Self Care | Payer: Medicare Other | Source: Ambulatory Visit | Attending: Cardiology | Admitting: Cardiology

## 2015-08-07 VITALS — BP 124/68 | HR 90 | Wt 198.0 lb

## 2015-08-07 DIAGNOSIS — I129 Hypertensive chronic kidney disease with stage 1 through stage 4 chronic kidney disease, or unspecified chronic kidney disease: Secondary | ICD-10-CM | POA: Diagnosis not present

## 2015-08-07 DIAGNOSIS — J449 Chronic obstructive pulmonary disease, unspecified: Secondary | ICD-10-CM | POA: Diagnosis not present

## 2015-08-07 DIAGNOSIS — E662 Morbid (severe) obesity with alveolar hypoventilation: Secondary | ICD-10-CM | POA: Diagnosis not present

## 2015-08-07 DIAGNOSIS — E039 Hypothyroidism, unspecified: Secondary | ICD-10-CM | POA: Diagnosis not present

## 2015-08-07 DIAGNOSIS — E785 Hyperlipidemia, unspecified: Secondary | ICD-10-CM | POA: Diagnosis not present

## 2015-08-07 DIAGNOSIS — Z9981 Dependence on supplemental oxygen: Secondary | ICD-10-CM | POA: Insufficient documentation

## 2015-08-07 DIAGNOSIS — I272 Other secondary pulmonary hypertension: Secondary | ICD-10-CM | POA: Diagnosis not present

## 2015-08-07 DIAGNOSIS — Z87891 Personal history of nicotine dependence: Secondary | ICD-10-CM | POA: Diagnosis not present

## 2015-08-07 DIAGNOSIS — I5032 Chronic diastolic (congestive) heart failure: Secondary | ICD-10-CM | POA: Insufficient documentation

## 2015-08-07 DIAGNOSIS — IMO0002 Reserved for concepts with insufficient information to code with codable children: Secondary | ICD-10-CM

## 2015-08-07 DIAGNOSIS — F329 Major depressive disorder, single episode, unspecified: Secondary | ICD-10-CM | POA: Insufficient documentation

## 2015-08-07 DIAGNOSIS — Z7901 Long term (current) use of anticoagulants: Secondary | ICD-10-CM | POA: Diagnosis not present

## 2015-08-07 DIAGNOSIS — Z79899 Other long term (current) drug therapy: Secondary | ICD-10-CM | POA: Diagnosis not present

## 2015-08-07 DIAGNOSIS — M069 Rheumatoid arthritis, unspecified: Secondary | ICD-10-CM | POA: Diagnosis not present

## 2015-08-07 DIAGNOSIS — I714 Abdominal aortic aneurysm, without rupture: Secondary | ICD-10-CM | POA: Diagnosis not present

## 2015-08-07 DIAGNOSIS — I482 Chronic atrial fibrillation, unspecified: Secondary | ICD-10-CM

## 2015-08-07 DIAGNOSIS — Z7984 Long term (current) use of oral hypoglycemic drugs: Secondary | ICD-10-CM | POA: Insufficient documentation

## 2015-08-07 DIAGNOSIS — I2781 Cor pulmonale (chronic): Secondary | ICD-10-CM | POA: Diagnosis not present

## 2015-08-07 LAB — BASIC METABOLIC PANEL
Anion gap: 10 (ref 5–15)
BUN: 43 mg/dL — AB (ref 6–20)
CHLORIDE: 97 mmol/L — AB (ref 101–111)
CO2: 33 mmol/L — AB (ref 22–32)
CREATININE: 1.97 mg/dL — AB (ref 0.44–1.00)
Calcium: 9.5 mg/dL (ref 8.9–10.3)
GFR calc Af Amer: 29 mL/min — ABNORMAL LOW (ref >60)
GFR calc non Af Amer: 25 mL/min — ABNORMAL LOW (ref >60)
Glucose, Bld: 133 mg/dL — ABNORMAL HIGH (ref 65–99)
Potassium: 4 mmol/L (ref 3.5–5.1)
Sodium: 140 mmol/L (ref 135–145)

## 2015-08-07 LAB — CBC
HEMATOCRIT: 36.9 % (ref 36.0–46.0)
HEMOGLOBIN: 11.3 g/dL — AB (ref 12.0–15.0)
MCH: 25.9 pg — AB (ref 26.0–34.0)
MCHC: 30.6 g/dL (ref 30.0–36.0)
MCV: 84.4 fL (ref 78.0–100.0)
Platelets: 163 10*3/uL (ref 150–400)
RBC: 4.37 MIL/uL (ref 3.87–5.11)
RDW: 17.6 % — ABNORMAL HIGH (ref 11.5–15.5)
WBC: 9.4 10*3/uL (ref 4.0–10.5)

## 2015-08-07 MED ORDER — TADALAFIL (PAH) 20 MG PO TABS: 40.0000 mg | ORAL_TABLET | Freq: Every day | ORAL | Status: DC

## 2015-08-07 MED ORDER — TADALAFIL (PAH) 20 MG PO TABS: 40.0000 mg | ORAL_TABLET | Freq: Every day | ORAL | Status: AC

## 2015-08-07 MED ORDER — METOLAZONE 2.5 MG PO TABS: ORAL_TABLET | ORAL | Status: AC

## 2015-08-07 MED ORDER — TORSEMIDE 20 MG PO TABS: 80.0000 mg | ORAL_TABLET | Freq: Two times a day (BID) | ORAL | Status: DC

## 2015-08-07 NOTE — Progress Notes (Signed)
6MW completed with patient. Pt ambulated 620 ft (188.66m Patients HR ranged from 83-109, o2 sats ranged from 79-90% on 8L of oxygen Pt was able to ambulate without any rest breaks or difficulty, rollator walker was used to assist with ambulation

## 2015-08-07 NOTE — Patient Instructions (Addendum)
INCREASE torsemide to 80 mg (4 tabs), twice a day INCREASE adcirca to 40 mg (2 tabs), daily CHANGE metolazone to 2.5 mg, twice a week on Tuesdays and Fridays   Labs today and again in 2 weeks (BMET)  Your physician recommends that you schedule a follow-up appointment in: 6 weeks  HAPPY HOLIDAYS!!!!!!!!

## 2015-08-08 NOTE — Progress Notes (Signed)
Patient ID: Tracey Duncan, female   DOB: 1946/07/12, 69 y.o.   MRN: [id number]  PCP: [facility] Pulmonary: Dr Lamonte Sakai Primary HF: Dr Aundra Dubin  69 yo with history of COPD on home oxygen, OSA (? OHS/OSA), rheumatoid arthritis, and chronic atrial fibrillation presents for cardiology followup.  She was diagnosed with atrial fibrillation around 2004. She failed cardioversion and it has been chronic.  She has noted exertional dyspnea since at least 2012.  She was diagnosed with COPD back in 2002.  She no longer smokes.  She had been on CPAP for OSA prior, but was started on home oxygen all the time by Dr. Lamonte Sakai.  No tachypalpitations, no chest pain.  Echo in 2/14 showed a moderately dilated RV with moderately decreased systolic function, PA systolic pressure 65 mmHg. She was started on apixaban.   V/Q scan was negative for chronic PE.  She has been diagnosed with rheumatoid arthritis and is on [name].  She had a right heart cath in 11/14 that confirmed moderate to severe PAH with PVR 7.4 WU.  In 12/14, was started on Tyvaso. She was changed to Opsumit when [place] in 2015 was nearly identical to previous study in 07/2013.  Adcirca also added.   During a HF office visit 01/20/15, 69 she c/o DOE with minimal activity despite increased dose of Lasix and decrease weight. Also had productive cough with green sputum, no fever. She was noted to be hypoxic and fluid overloaded and was admitted on for IV diuresis.  RHC after diuresis showed nearly normal filling pressures and PA pressure 44/19 (decreased from prior).    She returns for follow up. Overall feeling ok. Ongoing dyspnea with exertion: does ok in house but short of breath after walking about 100 feet. At home she is on 6 liters oxygen at rest and 8 liters with exertion. Sleeps on 2 pillows at night chronically. Denies syncope/presyncope.  Ambulates with a rolling walker. Works a little for an [employer] firm. Taking all medications. Since last appointment, weight is down 13  lbs.    Labs (4/14): RF < 10, ANA negative, BNP 294, K 3.8, creatinine 1.7  Labs (9/14): K 4.5, creatinine 1.74 Labs (11/14): K 4.5=>3.9, creatinine 1.6, BNP 300 Labs (2/15): K 4.3, creatinine 1.6=>1.5, BNP 383=>311, LDL 72, HDL 49 Labs (3/15): K 4.2, creatinine 1.5 Labs (5/16) K 3.7, creatinine 1.6 Labs (02/12/2015): K 3.8 Creatinine 1.79  Labs (7/16): K 3.9, creatinine 1.75, HCT 30.7 Labs (06/15/2015) : K 4,4 Creatinine 1.51  Labs (11/16): K 4.3, creatinine 1.7  PMH: 1. OSA/OHS: On CPAP at night and oxygen during the day. 2. COPD: Diagnosed 2002.  FEV1 65% predicted in 4/14.  3. HTN 4. Chronic atrial fibrillation: Failed DCCV in 12/04.   5. 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  Echo (2/14) with EF 55-60%, moderately dilated RV with moderately decreased systolic function, PA systolic pressure 65 mmHg.  V/Q scan in 3/14 was negative for chronic PE. PFTs (4/14): FEV1 65% predicted, mild obstructive defect with severely decreased DLCO.  RHC (11/14): mean RA 8, PA 68/28 mean 45, mean PCWP 11, CI 2.29, PVR 7.2 WU. 6 minute walk (2/15) 126 meters.  6 minute walk (3/15) 118 m.  Echo (4/16) with EF 55-60%, moderately dilated RV with mildly decreased systolic function, PASP 73 mmHg.  RHC (5/16) with mean RA 7, PA 44/19 mean 31, mean PCWP 18, CI 3.66, PVR 2 WU. She did not tolerate Tyvaso.  She is  currently taking Adcirca and Opsumit.  6 minute walk (7/16) 172 meters.  6 minute walk (11/16) 189 m.  6. Lexiscan Thallium in 11/11 with no evidence for ischemia or infarction.  7. Impaired fasting glucose. 8. Hypothyroidism 9. Obesity. 10. CKD 11. Rheumatoid arthritis 12. ABIs (11/14): Normal 13. Hyperlipidemia: Myalgias with Crestor.  14. Depression 15. Chronic diastolic CHF 16. Umbilical hernia 17. AAA: 3.5 cm on 7/16 CT abdomen.  18. Rectal bleeding: 6/16.  CT abdomen did not show anything that would be likely to cause bleeding.  19. ABIs (8/16):  Normal.   SH: Widow, moved to [place] from [place] to live with sister.  Quit smoking in 1993.  Rare ETOH. Retired Optometrist.   FH: Brother with MI at 60, father with MI at 62.  ROS: All systems reviewed and negative except as per HPI.   Current Outpatient Prescriptions  Medication Sig Dispense Refill  . acetaminophen (TYLENOL) 500 MG tablet Take 1,000 mg by mouth daily as needed (for arthritic pain).    . ADVAIR DISKUS 250-50 MCG/DOSE AEPB INHALE 1 PUFF INTO THE LUNGS EVERY 12 (TWELVE) HOURS. 60 each 5  . albuterol (PROAIR HFA) 108 (90 BASE) MCG/ACT inhaler Inhale 2 puffs into the lungs every 4 (four) hours as needed for wheezing or shortness of breath.     . bisoprolol (ZEBETA) 10 MG tablet TAKE 1 TABLET EVERY DAY 30 tablet 6  . calcium-vitamin D (OSCAL WITH D) 500-200 MG-UNIT per tablet Take 1 tablet by mouth daily.    . cetirizine (ZYRTEC) 10 MG tablet Take 10 mg by mouth at bedtime.     . cholecalciferol (VITAMIN D) 1000 UNITS tablet Take 1,000 Units by mouth daily.    . colchicine 0.6 MG tablet Take 0.6 mg by mouth daily as needed (gouty flare).    . DENTAGEL 1.1 % GEL dental gel Place 1 application onto teeth at bedtime.     Marland Kitchen ELIQUIS 5 MG TABS tablet TAKE 1 TABLET BY MOUTH 2 TIMES DAILY. 60 tablet 3  . Febuxostat (ULORIC) 80 MG TABS Take 80 mg by mouth daily.    Marland Kitchen gabapentin (NEURONTIN) 300 MG capsule Take 300 mg by mouth 2 (two) times daily.    Marland Kitchen ipratropium-albuterol (DUONEB) 0.5-2.5 (3) MG/3ML SOLN Take 3 mLs by nebulization every 4 (four) hours as needed (for wheezing or shortness of breath).     . leflunomide (ARAVA) 20 MG tablet Take 20 mg by mouth daily.     Marland Kitchen levothyroxine (SYNTHROID, LEVOTHROID) 50 MCG tablet Take 1 tablet (50 mcg total) by mouth daily before breakfast. 90 tablet 3  . Macitentan (OPSUMIT) 10 MG TABS Take 10 mg by mouth daily. 30 tablet 6  . Magnesium 400 MG CAPS Take 1 capsule by mouth daily.    . metFORMIN (GLUCOPHAGE) 500 MG tablet Take 1 tablet (500 mg  total) by mouth 2 (two) times daily with a meal. 180 tablet 3  . metolazone (ZAROXOLYN) 2.5 MG tablet Take 2.5 mg of Metolazone on Tuesday and Friday 90 tablet 3  . montelukast (SINGULAIR) 10 MG tablet Take 10 mg by mouth at bedtime.    . Multiple Vitamins-Minerals (CENTRUM SILVER PO) Take 1 tablet by mouth daily.    Marland Kitchen OVER THE COUNTER MEDICATION Apply 1 application topically daily as needed (dry mouth).    . Polyvinyl Alcohol-Povidone (REFRESH OP) Place 1 drop into both eyes daily as needed (for dry eyes).    . potassium chloride SA (KLOR-CON M20) 20 MEQ tablet  Take 60 meq (3 tabs) in AM and 40 meq (2 tabs) in PM, take an extra 20 meq (1 tab) on Wed with Metolazone 120 tablet 4  . pravastatin (PRAVACHOL) 20 MG tablet TAKE 1 TABLET BY MOUTH EVERY EVENING 30 tablet 6  . predniSONE (DELTASONE) 5 MG tablet Take 5 mg by mouth daily with breakfast.    . PRESCRIPTION MEDICATION Oxygen.   Pt uses 6-8 liters of oxygen daily.    . sertraline (ZOLOFT) 50 MG tablet Take 50 mg by mouth at bedtime.    Marland Kitchen SPIRIVA HANDIHALER 18 MCG inhalation capsule INHALE 1 CAPSULE VIA HANDIHALER ONCE DAILY AT THE SAME TIME EVERY DAY 1 capsule 3  . Tadalafil, PAH, (ADCIRCA) 20 MG TABS Take 2 tablets (40 mg total) by mouth daily. 60 tablet 6  . Throat Lozenges (SALESE MT) Use as directed 1 lozenge in the mouth or throat daily as needed (dry mouth).    . torsemide (DEMADEX) 20 MG tablet Take 4 tablets (80 mg total) by mouth 2 (two) times daily. 240 tablet 3  . traMADol (ULTRAM) 50 MG tablet Take 100 mg by mouth as needed for moderate pain.      No current facility-administered medications for this encounter.    BP 124/68 mmHg  Pulse 90  Wt 198 lb (89.812 kg)  SpO2 91% General: NAD, obese.  Neck: JVP 8-9 cm, no thyromegaly or thyroid nodule.  Lungs: Decreased breath sounds, occasional mild wheezes bilaterally. On 4 liters continuous oxygen.   CV: Nondisplaced PMI.  Heart irregular S1/S2, no S3/S4, no murmur. 1+ ankle edema  bilaterally.  No carotid bruit.  Difficult to palpate pedal pulses.  Abdomen: Soft, nontender, no hepatosplenomegaly, nondistended.  Skin: Intact without lesions or rashes.  Neurologic: Alert and oriented x 3.  Psych: Normal affect. Extremities: No clubbing or cyanosis.    Assessment/Plan: 1. Atrial fibrillation: Chronic x years.  She would be unlikely to remain in NSR if cardioversion were attempted.  Plan rate control.  CHADSVASC = 3 at least.  No further GI bleeding.  Rate controlled today - Continue apixaban.  Check CBC today.  - Continue current dose of  bisoprolol.  2. Pulmonary arterial HTN/cor pulmonale: Significant right heart failure.  Last echo in 4/16 with moderately dilated and mildly dysfunctional RV.  Most recent [place] in 5/16 showed significantly improved PAH, PVR down to 2 WU.  PAH likely has mixed etiology: Suspect that there is a Group I PAH component from rheumatoid arthritis, but patient also has COPD and OHS/OSA.  FEV1 is 65%, so the degree of pulmonary HTN does seem out of proportion to COPD alone.  Given suspected component of Group I PAH,  started her on Tyvaso but she did not tolerate this.  Currently on Adcirca and Opsumit with good RHC results in 5/16 as above.  6 minute walk is gradually improving.  - Continue Opsumit.  - I will increase Adcirca to 40 mg daily.    3. CKD: BMET today.    4. RA: Being treated (Dr. Amil Amen).  Stable joint symptoms.  5. Chronic diastolic CHF with prominent RV failure:  NYHA III.  Volume status better today, but still has some volume overload. - Increase torsemide to 80 mg bid.  Repeat BMET in 2 wks.  - Continue metolazone twice a week.  6. AAA: 3.5 cm on CT in 7/16.  She will need abdominal US in 7/18 to follow.   Followup in 6 wks.    [employer]  08/08/2015

## 2015-08-10 DIAGNOSIS — J449 Chronic obstructive pulmonary disease, unspecified: Secondary | ICD-10-CM | POA: Diagnosis not present

## 2015-08-12 ENCOUNTER — Other Ambulatory Visit (HOSPITAL_COMMUNITY): Payer: Self-pay | Admitting: *Deleted

## 2015-08-12 MED ORDER — SERTRALINE HCL 50 MG PO TABS: 50.0000 mg | ORAL_TABLET | Freq: Every day | ORAL | Status: DC

## 2015-08-21 ENCOUNTER — Ambulatory Visit (HOSPITAL_COMMUNITY)
Admission: RE | Admit: 2015-08-21 | Discharge: 2015-08-21 | Disposition: A | Discharge status: Home / Self Care | Payer: Medicare Other | Source: Ambulatory Visit | Attending: Internal Medicine | Admitting: Internal Medicine

## 2015-08-21 DIAGNOSIS — I5032 Chronic diastolic (congestive) heart failure: Secondary | ICD-10-CM | POA: Diagnosis not present

## 2015-08-21 LAB — BASIC METABOLIC PANEL
Anion gap: 12 (ref 5–15)
BUN: 47 mg/dL — AB (ref 6–20)
CALCIUM: 9 mg/dL (ref 8.9–10.3)
CO2: 30 mmol/L (ref 22–32)
CREATININE: 2.3 mg/dL — AB (ref 0.44–1.00)
Chloride: 99 mmol/L — ABNORMAL LOW (ref 101–111)
GFR calc Af Amer: 24 mL/min — ABNORMAL LOW (ref >60)
GFR, EST NON AFRICAN AMERICAN: 21 mL/min — AB (ref >60)
Glucose, Bld: 109 mg/dL — ABNORMAL HIGH (ref 65–99)
Potassium: 4.3 mmol/L (ref 3.5–5.1)
SODIUM: 141 mmol/L (ref 135–145)

## 2015-08-27 ENCOUNTER — Other Ambulatory Visit: Payer: Self-pay | Admitting: Emergency Medicine

## 2015-08-27 ENCOUNTER — Telehealth (HOSPITAL_COMMUNITY): Payer: Self-pay | Admitting: *Deleted

## 2015-08-27 DIAGNOSIS — J449 Chronic obstructive pulmonary disease, unspecified: Secondary | ICD-10-CM | POA: Diagnosis not present

## 2015-08-27 NOTE — Telephone Encounter (Signed)
Pt is concerned about cost of her Opsumit and Adcirca as she states [facility] has denied her this year.  Advised pt to call [facility] at [phone] and as for pt asst with her Opsumit.  She is agreeable and will let me know if they can not help her

## 2015-09-01 ENCOUNTER — Other Ambulatory Visit: Payer: Self-pay | Admitting: Emergency Medicine

## 2015-09-09 DIAGNOSIS — Z79899 Other long term (current) drug therapy: Secondary | ICD-10-CM | POA: Diagnosis not present

## 2015-09-09 DIAGNOSIS — G629 Polyneuropathy, unspecified: Secondary | ICD-10-CM | POA: Diagnosis not present

## 2015-09-09 DIAGNOSIS — M150 Primary generalized (osteo)arthritis: Secondary | ICD-10-CM | POA: Diagnosis not present

## 2015-09-09 DIAGNOSIS — M0589 Other rheumatoid arthritis with rheumatoid factor of multiple sites: Secondary | ICD-10-CM | POA: Diagnosis not present

## 2015-09-09 DIAGNOSIS — M1A09X1 Idiopathic chronic gout, multiple sites, with tophus (tophi): Secondary | ICD-10-CM | POA: Diagnosis not present

## 2015-09-10 ENCOUNTER — Other Ambulatory Visit: Payer: Self-pay | Admitting: Cardiology

## 2015-09-10 DIAGNOSIS — J449 Chronic obstructive pulmonary disease, unspecified: Secondary | ICD-10-CM | POA: Diagnosis not present

## 2015-09-14 ENCOUNTER — Other Ambulatory Visit (HOSPITAL_COMMUNITY): Payer: Medicare Other

## 2015-09-16 ENCOUNTER — Encounter: Payer: Medicare Other | Admitting: Internal Medicine

## 2015-09-18 ENCOUNTER — Ambulatory Visit (HOSPITAL_COMMUNITY)
Admission: RE | Admit: 2015-09-18 | Discharge: 2015-09-18 | Disposition: A | Discharge status: Home / Self Care | Payer: Medicare Other | Source: Ambulatory Visit | Attending: Internal Medicine | Admitting: Internal Medicine

## 2015-09-18 ENCOUNTER — Telehealth (HOSPITAL_COMMUNITY): Payer: Self-pay | Admitting: *Deleted

## 2015-09-18 DIAGNOSIS — I5032 Chronic diastolic (congestive) heart failure: Secondary | ICD-10-CM | POA: Diagnosis not present

## 2015-09-18 LAB — BASIC METABOLIC PANEL
ANION GAP: 11 (ref 5–15)
BUN: 51 mg/dL — ABNORMAL HIGH (ref 6–20)
CHLORIDE: 98 mmol/L — AB (ref 101–111)
CO2: 33 mmol/L — AB (ref 22–32)
Calcium: 9.5 mg/dL (ref 8.9–10.3)
Creatinine, Ser: 1.81 mg/dL — ABNORMAL HIGH (ref 0.44–1.00)
GFR calc non Af Amer: 28 mL/min — ABNORMAL LOW (ref >60)
GFR, EST AFRICAN AMERICAN: 32 mL/min — AB (ref >60)
Glucose, Bld: 150 mg/dL — ABNORMAL HIGH (ref 65–99)
Potassium: 5.2 mmol/L — ABNORMAL HIGH (ref 3.5–5.1)
SODIUM: 142 mmol/L (ref 135–145)

## 2015-09-18 MED ORDER — POTASSIUM CHLORIDE CRYS ER 20 MEQ PO TBCR: 40.0000 meq | EXTENDED_RELEASE_TABLET | Freq: Two times a day (BID) | ORAL | Status: AC

## 2015-09-18 NOTE — Telephone Encounter (Signed)
Notes Recorded by Scarlette Calico, RN on 09/18/2015 at 4:47 PM Pt aware, agreeable and verbalizes understanding, repeat labs sch for 1/20

## 2015-09-18 NOTE — Telephone Encounter (Signed)
-----  Message from Larey Dresser, MD sent at 09/18/2015 11:31 AM EST ----- Better creatinine, decrease KCl by 20 daily.  BMET in 1 week.

## 2015-09-25 ENCOUNTER — Ambulatory Visit (HOSPITAL_COMMUNITY)
Admission: RE | Admit: 2015-09-25 | Discharge: 2015-09-25 | Disposition: A | Discharge status: Home / Self Care | Payer: Medicare Other | Source: Ambulatory Visit | Attending: Cardiology | Admitting: Cardiology

## 2015-09-25 DIAGNOSIS — I5032 Chronic diastolic (congestive) heart failure: Secondary | ICD-10-CM | POA: Diagnosis not present

## 2015-09-25 LAB — BASIC METABOLIC PANEL
ANION GAP: 13 (ref 5–15)
BUN: 49 mg/dL — ABNORMAL HIGH (ref 6–20)
CHLORIDE: 99 mmol/L — AB (ref 101–111)
CO2: 31 mmol/L (ref 22–32)
CREATININE: 1.91 mg/dL — AB (ref 0.44–1.00)
Calcium: 9.1 mg/dL (ref 8.9–10.3)
GFR calc non Af Amer: 26 mL/min — ABNORMAL LOW (ref >60)
GFR, EST AFRICAN AMERICAN: 30 mL/min — AB (ref >60)
Glucose, Bld: 120 mg/dL — ABNORMAL HIGH (ref 65–99)
POTASSIUM: 4.7 mmol/L (ref 3.5–5.1)
Sodium: 143 mmol/L (ref 135–145)

## 2015-09-27 DIAGNOSIS — J449 Chronic obstructive pulmonary disease, unspecified: Secondary | ICD-10-CM | POA: Diagnosis not present

## 2015-09-28 ENCOUNTER — Other Ambulatory Visit: Payer: Self-pay | Admitting: Emergency Medicine

## 2015-10-01 ENCOUNTER — Ambulatory Visit (INDEPENDENT_AMBULATORY_CARE_PROVIDER_SITE_OTHER): Payer: Medicare Other | Admitting: Internal Medicine

## 2015-10-01 ENCOUNTER — Encounter: Payer: Self-pay | Admitting: Internal Medicine

## 2015-10-01 VITALS — BP 90/62 | HR 96 | Temp 98.6°F | Resp 16 | Ht 64.0 in | Wt 203.0 lb

## 2015-10-01 DIAGNOSIS — J449 Chronic obstructive pulmonary disease, unspecified: Secondary | ICD-10-CM | POA: Diagnosis not present

## 2015-10-01 DIAGNOSIS — I272 Other secondary pulmonary hypertension: Secondary | ICD-10-CM

## 2015-10-01 DIAGNOSIS — I5032 Chronic diastolic (congestive) heart failure: Secondary | ICD-10-CM | POA: Diagnosis not present

## 2015-10-01 DIAGNOSIS — IMO0002 Reserved for concepts with insufficient information to code with codable children: Secondary | ICD-10-CM

## 2015-10-01 MED ORDER — HYDROCORTISONE 1 % EX CREA: 1.0000 "application " | TOPICAL_CREAM | Freq: Two times a day (BID) | CUTANEOUS | Status: AC

## 2015-10-01 MED ORDER — PREDNISONE 10 MG PO TABS: ORAL_TABLET | ORAL | Status: AC

## 2015-10-01 NOTE — Progress Notes (Signed)
   Subjective:    Patient ID: Tracey Duncan, female    DOB: 04-19-1946, 70 y.o.   MRN: [id number]  HPI The patient is a 70 YO female coming in for cough and SOB. She has been struggling with complicated heart and lung problems including pulmonary hypertension and cor pulmonale and COPD. She is a non-smoker at this time. She has had more SOB in the last 2-[redacted] weeks along with more cough.She is still using her oxygen all the time but not helping as much. No sick contacts. Has tried using albuterol which helps a little. Still taking her advair, opsumit, adcirca. She has been also gaining some weight recently. Still taking her fluid pills as prescribed but up about 8 pounds in the last 1 week.   Review of Systems  Constitutional: Positive for activity change and fatigue. Negative for fever, chills, appetite change and unexpected weight change.  HENT: Positive for sore throat. Negative for congestion, ear discharge, ear pain, postnasal drip, rhinorrhea and sinus pressure.   Eyes: Negative.   Respiratory: Positive for cough, shortness of breath and wheezing. Negative for chest tightness.   Cardiovascular: Positive for leg swelling. Negative for chest pain and palpitations.  Gastrointestinal: Negative for nausea, abdominal pain, diarrhea, constipation and abdominal distention.  Musculoskeletal: Positive for gait problem. Negative for myalgias, back pain and arthralgias.  Skin: Negative for color change, pallor, rash and wound.  Neurological: Negative.   Psychiatric/Behavioral: Negative.       Objective:   Physical Exam  Constitutional: She is oriented to person, place, and time. She appears well-developed and well-nourished.  HENT:  Head: Normocephalic and atraumatic.  Eyes: EOM are normal.  Neck: Normal range of motion.  Cardiovascular: Normal rate.   Pulmonary/Chest: Effort normal. No respiratory distress. She has wheezes. She has no rales. She exhibits no tenderness.  Abdominal: Soft. Bowel  sounds are normal. She exhibits no distension. There is no tenderness. There is no rebound.  Musculoskeletal: She exhibits edema.  1+ edema to mid shin bilaterally  Neurological: She is alert and oriented to person, place, and time. Coordination abnormal.  Uses walker and on oxygen all the time.   Skin: Skin is warm and dry.  Psychiatric: She has a normal mood and affect.   Filed Vitals:   10/01/15 1021  BP: 90/62  Pulse: 96  Temp: 98.6 F (37 C)  TempSrc: Oral  Resp: 16  Height: _0  (1.626 m)  Weight: 203 lb (92.08 kg)  SpO2: 85%      Assessment & Plan:

## 2015-10-01 NOTE — Progress Notes (Signed)
Pre visit review using our clinic review tool, if applicable. No additional management support is needed unless otherwise documented below in the visit note.

## 2015-10-01 NOTE — Patient Instructions (Signed)
Take the metalozone on Friday as scheduled. Take an additional dose of that on Sunday. Then resume your usual Tue/Fri dosing until you see the cardiologist.   Take an extra potassium pill on the days you take the [place].   We are sending in a cream for the bumps around your lips that you can use twice a day for 1-2 weeks (this should get rid of them).   We are sending in a steroid pack for you. Take 5 pills a day for days 1-3 then take 4 pills a day on days 4-7, then take 3 pills a day on days 8-10, then take 2 pills a day on days 11-13, then take 1 pill a day on days 14-16, then resume your 5 mg daily dose.

## 2015-10-02 NOTE — Assessment & Plan Note (Signed)
Rx for prednisone taper today due to wheezing and more hypoxia than normal for her. Continue advair, montelukast, albuterol prn, spiriva.

## 2015-10-02 NOTE — Assessment & Plan Note (Signed)
With flare today acute on chronic. Treating with additional dose of metolazone this week (3 times total in next week) and keep torsemide. Suspect some of her rising potassium is due to less diuresis. Call back next week if weight is not decreasing. She sees Dr. Aundra Dubin in 2 weeks and may need adjustment again.

## 2015-10-02 NOTE — Assessment & Plan Note (Signed)
With flare today with more breathlessness and more hypoxia. She was 85% at rest on oxygen which went up to 87% highest during visit. She was SOB with talking and wheezing on exam. Treating with steroids. Continue opsumit and adcirca. She is also managed with fluid control with metolazone and torsemide.

## 2015-10-11 DIAGNOSIS — J449 Chronic obstructive pulmonary disease, unspecified: Secondary | ICD-10-CM | POA: Diagnosis not present

## 2015-10-13 ENCOUNTER — Telehealth: Payer: Self-pay | Admitting: *Deleted

## 2015-10-13 ENCOUNTER — Other Ambulatory Visit: Payer: Self-pay | Admitting: Cardiology

## 2015-10-13 NOTE — Telephone Encounter (Signed)
Reciecved call pt states md recommend a cream for her to use when she saw her back in January. Wanting to get the name. Per chart md cream was Hydrocortisone 1%. Gave info to pt...Tracey Duncan

## 2015-10-15 ENCOUNTER — Ambulatory Visit (HOSPITAL_COMMUNITY)
Admission: RE | Admit: 2015-10-15 | Discharge: 2015-10-15 | Disposition: A | Discharge status: Home / Self Care | Payer: Medicare Other | Source: Ambulatory Visit | Attending: Cardiology | Admitting: Cardiology

## 2015-10-15 ENCOUNTER — Encounter (HOSPITAL_COMMUNITY): Payer: Self-pay

## 2015-10-15 VITALS — BP 118/72 | HR 87 | Wt 200.2 lb

## 2015-10-15 DIAGNOSIS — F329 Major depressive disorder, single episode, unspecified: Secondary | ICD-10-CM | POA: Diagnosis not present

## 2015-10-15 DIAGNOSIS — R7301 Impaired fasting glucose: Secondary | ICD-10-CM | POA: Diagnosis not present

## 2015-10-15 DIAGNOSIS — E785 Hyperlipidemia, unspecified: Secondary | ICD-10-CM | POA: Diagnosis not present

## 2015-10-15 DIAGNOSIS — J9611 Chronic respiratory failure with hypoxia: Secondary | ICD-10-CM | POA: Diagnosis not present

## 2015-10-15 DIAGNOSIS — E039 Hypothyroidism, unspecified: Secondary | ICD-10-CM | POA: Diagnosis not present

## 2015-10-15 DIAGNOSIS — I714 Abdominal aortic aneurysm, without rupture: Secondary | ICD-10-CM | POA: Diagnosis not present

## 2015-10-15 DIAGNOSIS — I272 Other secondary pulmonary hypertension: Secondary | ICD-10-CM

## 2015-10-15 DIAGNOSIS — I130 Hypertensive heart and chronic kidney disease with heart failure and stage 1 through stage 4 chronic kidney disease, or unspecified chronic kidney disease: Secondary | ICD-10-CM | POA: Diagnosis not present

## 2015-10-15 DIAGNOSIS — Z7984 Long term (current) use of oral hypoglycemic drugs: Secondary | ICD-10-CM | POA: Insufficient documentation

## 2015-10-15 DIAGNOSIS — Z79899 Other long term (current) drug therapy: Secondary | ICD-10-CM | POA: Diagnosis not present

## 2015-10-15 DIAGNOSIS — Z8249 Family history of ischemic heart disease and other diseases of the circulatory system: Secondary | ICD-10-CM | POA: Insufficient documentation

## 2015-10-15 DIAGNOSIS — M069 Rheumatoid arthritis, unspecified: Secondary | ICD-10-CM | POA: Insufficient documentation

## 2015-10-15 DIAGNOSIS — G4733 Obstructive sleep apnea (adult) (pediatric): Secondary | ICD-10-CM | POA: Insufficient documentation

## 2015-10-15 DIAGNOSIS — Z9981 Dependence on supplemental oxygen: Secondary | ICD-10-CM | POA: Diagnosis not present

## 2015-10-15 DIAGNOSIS — Z87891 Personal history of nicotine dependence: Secondary | ICD-10-CM | POA: Diagnosis not present

## 2015-10-15 DIAGNOSIS — I482 Chronic atrial fibrillation: Secondary | ICD-10-CM | POA: Insufficient documentation

## 2015-10-15 DIAGNOSIS — Z7902 Long term (current) use of antithrombotics/antiplatelets: Secondary | ICD-10-CM | POA: Insufficient documentation

## 2015-10-15 DIAGNOSIS — I2781 Cor pulmonale (chronic): Secondary | ICD-10-CM | POA: Diagnosis not present

## 2015-10-15 DIAGNOSIS — E669 Obesity, unspecified: Secondary | ICD-10-CM | POA: Diagnosis not present

## 2015-10-15 DIAGNOSIS — J449 Chronic obstructive pulmonary disease, unspecified: Secondary | ICD-10-CM | POA: Diagnosis not present

## 2015-10-15 DIAGNOSIS — N189 Chronic kidney disease, unspecified: Secondary | ICD-10-CM | POA: Diagnosis not present

## 2015-10-15 DIAGNOSIS — I5032 Chronic diastolic (congestive) heart failure: Secondary | ICD-10-CM | POA: Diagnosis not present

## 2015-10-15 MED ORDER — TORSEMIDE 20 MG PO TABS: 80.0000 mg | ORAL_TABLET | Freq: Two times a day (BID) | ORAL | Status: AC

## 2015-10-15 NOTE — Progress Notes (Signed)
6 min walk  Start on 8L of O2 O2- 93% HR-89  End O2-88% HR-113  631f= 207.2675m

## 2015-10-15 NOTE — Progress Notes (Signed)
Patient ID: Tracey Duncan, female   DOB: 07/09/1946, 70 y.o.   MRN: [id number]  PCP: [facility] Pulmonary: Dr Lamonte Sakai Primary HF: Dr Aundra Dubin  70 yo with history of COPD on home oxygen, OSA (? OHS/OSA), rheumatoid arthritis, and chronic atrial fibrillation presents for cardiology followup.  She was diagnosed with atrial fibrillation around 2004. She failed cardioversion and it has been chronic.  She has noted exertional dyspnea since at least 2012.  She was diagnosed with COPD back in 2002.  She no longer smokes.  She had been on CPAP for OSA prior, but was started on home oxygen all the time by Dr. Lamonte Sakai.  No tachypalpitations, no chest pain.  Echo in 2/14 showed a moderately dilated RV with moderately decreased systolic function, PA systolic pressure 65 mmHg. She was started on apixaban.   V/Q scan was negative for chronic PE.  She has been diagnosed with rheumatoid arthritis and is on [name].  She had a right heart cath in 11/14 that confirmed moderate to severe PAH with PVR 7.4 WU.  In 12/14, was started on Tyvaso. She was changed to Opsumit when [place] in 2015 was nearly identical to previous study in 07/2013.  Adcirca also added.   During a HF office visit 01/20/15, she c/o DOE with minimal activity despite increased dose of Lasix and decrease weight. Also had productive cough with green sputum, no fever. She was noted to be hypoxic and fluid overloaded and was admitted on for IV diuresis.  RHC after diuresis showed nearly normal filling pressures and PA pressure 44/19 (decreased from prior).    She returns for follow up. Overall feeling ok. Ongoing dyspnea with exertion: does ok in house but short of breath after walking about 100 feet. She is on 6 liters oxygen at rest and 8 liters with exertion. Sleeps on 2 pillows at night chronically. Denies syncope/presyncope.  Ambulates with a rolling walker. Weight is stable.  No BRBPR or melena.  6 minute walk today 207 m.   Labs (4/14): RF < 10, ANA negative, BNP  294, K 3.8, creatinine 1.7  Labs (9/14): K 4.5, creatinine 1.74 Labs (11/14): K 4.5=>3.9, creatinine 1.6, BNP 300 Labs (2/15): K 4.3, creatinine 1.6=>1.5, BNP 383=>311, LDL 72, HDL 49 Labs (3/15): K 4.2, creatinine 1.5 Labs (5/16) K 3.7, creatinine 1.6 Labs (02/12/2015): K 3.8 Creatinine 1.79  Labs (7/16): K 3.9, creatinine 1.75, HCT 30.7 Labs (06/15/2015) : K 4,4 Creatinine 1.51  Labs (11/16): K 4.3, creatinine 1.7 Labs (12/16): HCT 36.9 Labs (1/17): K 4.7, creatinne 1.9  PMH: 1. OSA/OHS: On CPAP at night and oxygen during the day. 2. COPD: Diagnosed 2002.  FEV1 65% predicted in 4/14.  3. HTN 4. Chronic atrial fibrillation: Failed DCCV in 12/04.   5. Pulmonary HTN/cor pulmonale: Suspect this is due to a combination of Group 1 PAH (possibly related to rheumatoid arthritis) and group 3 PH due to COPD and OHS/OSA.  Echo (2/14) with EF 55-60%, moderately dilated RV with moderately decreased systolic function, PA systolic pressure 65 mmHg.  V/Q scan in 3/14 was negative for chronic PE. PFTs (4/14): FEV1 65% predicted, mild obstructive defect with severely decreased DLCO.  RHC (11/14): mean RA 8, PA 68/28 mean 45, mean PCWP 11, CI 2.29, PVR 7.2 WU. 6 minute walk (2/15) 126 meters.  6 minute walk (3/15) 118 m.  Echo (4/16) with EF 55-60%, moderately dilated RV with mildly decreased systolic function, PASP 73 mmHg.  RHC (5/16) with mean RA 7, PA 44/19  mean 31, mean PCWP 18, CI 3.66, PVR 2 WU. She did not tolerate Tyvaso.  She is currently taking Adcirca and Opsumit.  6 minute walk (7/16) 172 meters.  6 minute walk (11/16) 189 m. 6 minute walk (1/17) 207 m.  6. Lexiscan Thallium in 11/11 with no evidence for ischemia or infarction.  7. Impaired fasting glucose. 8. Hypothyroidism 9. Obesity. 10. CKD 11. Rheumatoid arthritis 12. ABIs (11/14): Normal 13. Hyperlipidemia: Myalgias with Crestor.  14. Depression 15. Chronic diastolic CHF 16. Umbilical hernia 17. AAA: 3.5 cm on 7/16 CT abdomen.  18.  Rectal bleeding: 6/16.  CT abdomen did not show anything that would be likely to cause bleeding.  19. ABIs (8/16): Normal.   SH: Widow, moved to [place] from [place] to live with sister.  Quit smoking in 1993.  Rare ETOH. Retired Optometrist.   FH: Brother with MI at 64, father with MI at 19.  ROS: All systems reviewed and negative except as per HPI.   Current Outpatient Prescriptions  Medication Sig Dispense Refill  . acetaminophen (TYLENOL) 500 MG tablet Take 1,000 mg by mouth daily as needed (for arthritic pain).    . ADVAIR DISKUS 250-50 MCG/DOSE AEPB INHALE 1 PUFF INTO THE LUNGS EVERY 12 HOURS. 60 each 5  . albuterol (PROAIR HFA) 108 (90 BASE) MCG/ACT inhaler Inhale 2 puffs into the lungs every 4 (four) hours as needed for wheezing or shortness of breath.     . bisoprolol (ZEBETA) 10 MG tablet TAKE 1 TABLET EVERY DAY 30 tablet 6  . calcium-vitamin D (OSCAL WITH D) 500-200 MG-UNIT per tablet Take 1 tablet by mouth daily.    . cetirizine (ZYRTEC) 10 MG tablet Take 10 mg by mouth at bedtime.     . cholecalciferol (VITAMIN D) 1000 UNITS tablet Take 1,000 Units by mouth daily.    . colchicine 0.6 MG tablet Take 0.6 mg by mouth daily as needed (gouty flare).    . DENTAGEL 1.1 % GEL dental gel Place 1 application onto teeth at bedtime. Reported on 10/01/2015    . ELIQUIS 5 MG TABS tablet TAKE 1 TABLET BY MOUTH 2 TIMES DAILY. 60 tablet 3  . Febuxostat (ULORIC) 80 MG TABS Take 80 mg by mouth daily.    Marland Kitchen gabapentin (NEURONTIN) 300 MG capsule Take 300 mg by mouth 2 (two) times daily.    . hydrocortisone cream 1 % Apply 1 application topically 2 (two) times daily. 30 g 0  . ipratropium-albuterol (DUONEB) 0.5-2.5 (3) MG/3ML SOLN Take 3 mLs by nebulization every 4 (four) hours as needed (for wheezing or shortness of breath).     . leflunomide (ARAVA) 20 MG tablet Take 20 mg by mouth daily.     Marland Kitchen levothyroxine (SYNTHROID, LEVOTHROID) 50 MCG tablet Take 1 tablet (50 mcg total) by mouth daily before  breakfast. 90 tablet 3  . Macitentan (OPSUMIT) 10 MG TABS Take 10 mg by mouth daily. 30 tablet 6  . Magnesium 400 MG CAPS Take 1 capsule by mouth daily.    . metFORMIN (GLUCOPHAGE) 500 MG tablet Take 1 tablet (500 mg total) by mouth 2 (two) times daily with a meal. 180 tablet 3  . metolazone (ZAROXOLYN) 2.5 MG tablet Take 2.5 mg of Metolazone on Tuesday and Friday 90 tablet 3  . montelukast (SINGULAIR) 10 MG tablet TAKE 1 TABLET BY MOUTH AT BEDTIME 30 tablet 0  . Multiple Vitamins-Minerals (CENTRUM SILVER PO) Take 1 tablet by mouth daily.    Marland Kitchen OVER THE  COUNTER MEDICATION Apply 1 application topically daily as needed (dry mouth). Reported on 10/01/2015    . Polyvinyl Alcohol-Povidone (REFRESH OP) Place 1 drop into both eyes daily as needed (for dry eyes).    . potassium chloride SA (KLOR-CON M20) 20 MEQ tablet Take 2 tablets (40 mEq total) by mouth 2 (two) times daily. take an extra 20 meq (1 tab) on Wed with Metolazone 120 tablet 4  . pravastatin (PRAVACHOL) 20 MG tablet TAKE 1 TABLET BY MOUTH EVERY EVENING 30 tablet 6  . predniSONE (DELTASONE) 10 MG tablet Days 1-3 take 5 pills, days 4-7 take 4 pills, days 8-10 take 3 pills, days 11-13 take 2 pills, days 14-16 take 1 pill, then resume 5 mg/day. 50 tablet 0  . PRESCRIPTION MEDICATION Oxygen.   Pt uses 6-8 liters of oxygen daily.    . sertraline (ZOLOFT) 50 MG tablet TAKE 1 TABLET BY MOUTH AT BEDTIME (NEED APPT) 30 tablet 0  . SPIRIVA HANDIHALER 18 MCG inhalation capsule INHALE 1 CAPSULE VIA HANDIHALER ONCE DAILY AT THE SAME TIME EVERY DAY 1 capsule 3  . Tadalafil, PAH, (ADCIRCA) 20 MG TABS Take 2 tablets (40 mg total) by mouth daily. 60 tablet 6  . Throat Lozenges (SALESE MT) Use as directed 1 lozenge in the mouth or throat daily as needed (dry mouth). Reported on 10/01/2015    . torsemide (DEMADEX) 20 MG tablet Take 4 tablets (80 mg total) by mouth 2 (two) times daily. 240 tablet 3  . traMADol (ULTRAM) 50 MG tablet Take 100 mg by mouth as needed for  moderate pain.      No current facility-administered medications for this encounter.    BP 118/72 mmHg  Pulse 87  Wt 200 lb 4 oz (90.833 kg)  SpO2 95% General: NAD, obese.  Neck: JVP 8-9 cm, no thyromegaly or thyroid nodule.  Lungs: Slight crackles left base.  CV: Nondisplaced PMI.  Heart irregular S1/S2, no S3/S4, no murmur. 1+ ankle edema bilaterally.  No carotid bruit.  Difficult to palpate pedal pulses.  Abdomen: Soft, nontender, no hepatosplenomegaly, nondistended.  Skin: Intact without lesions or rashes.  Neurologic: Alert and oriented x 3.  Psych: Normal affect. Extremities: No clubbing or cyanosis.    Assessment/Plan: 1. Atrial fibrillation: Chronic x years.  She would be unlikely to remain in NSR if cardioversion were attempted.  Plan rate control.  CHADSVASC = 3 at least.  No further GI bleeding.  Rate controlled.  - Continue apixaban.   - Continue current dose of  bisoprolol.  2. Pulmonary arterial HTN/cor pulmonale: Significant right heart failure.  Last echo in 4/16 with moderately dilated and mildly dysfunctional RV.  Most recent [place] in 5/16 showed significantly improved PAH, PVR down to 2 WU.  PAH likely has mixed etiology: Suspect that there is a Group I PAH component from rheumatoid arthritis, but patient also has COPD and OHS/OSA.  FEV1 is 65%, so the degree of pulmonary HTN does seem out of proportion to COPD alone.  Given suspected component of Group I PAH,  started her on Tyvaso but she did not tolerate this.  Currently on Adcirca and Opsumit with good RHC results in 5/16 as above.  6 minute walk is gradually improving => 6 minute walk today showed mild improvement over prior walk.  - Continue Opsumit.  - Continue Adcirca 40 mg daily.    3. CKD: BMET in 1 week.     4. RA: Being treated (Dr. Amil Amen).  Stable joint symptoms.  5. Chronic diastolic CHF with prominent RV failure:  NYHA III.  She has some volume overload on exam on exam today.  - Increase torsemide to 80  mg bid (had been taking torsemide 80 qam/60 qpm).  Repeat BMET in 1 wk.  - Continue metolazone twice a week.  6. AAA: 3.5 cm on CT in 7/16.  She will need abdominal US in 7/18 to follow.    [name]  10/15/2015

## 2015-10-15 NOTE — Patient Instructions (Signed)
Take Torsemide 35m twice daily.  Labs: 1 week (bmet bnp)  Follow up with Dr.McLean in  6 weeks

## 2015-10-23 ENCOUNTER — Ambulatory Visit (HOSPITAL_COMMUNITY)
Admission: RE | Admit: 2015-10-23 | Discharge: 2015-10-23 | Disposition: A | Discharge status: Home / Self Care | Payer: Medicare Other | Source: Ambulatory Visit | Attending: Cardiology | Admitting: Cardiology

## 2015-10-23 DIAGNOSIS — J9611 Chronic respiratory failure with hypoxia: Secondary | ICD-10-CM | POA: Diagnosis not present

## 2015-10-23 LAB — BASIC METABOLIC PANEL
Anion gap: 13 (ref 5–15)
BUN: 47 mg/dL — AB (ref 6–20)
CO2: 32 mmol/L (ref 22–32)
CREATININE: 2.01 mg/dL — AB (ref 0.44–1.00)
Calcium: 9.4 mg/dL (ref 8.9–10.3)
Chloride: 96 mmol/L — ABNORMAL LOW (ref 101–111)
GFR calc Af Amer: 28 mL/min — ABNORMAL LOW (ref >60)
GFR, EST NON AFRICAN AMERICAN: 24 mL/min — AB (ref >60)
GLUCOSE: 157 mg/dL — AB (ref 65–99)
POTASSIUM: 4.8 mmol/L (ref 3.5–5.1)
Sodium: 141 mmol/L (ref 135–145)

## 2015-10-27 ENCOUNTER — Emergency Department (HOSPITAL_COMMUNITY): Payer: Medicare Other

## 2015-10-27 ENCOUNTER — Encounter (HOSPITAL_COMMUNITY): Payer: Self-pay | Admitting: *Deleted

## 2015-10-27 ENCOUNTER — Inpatient Hospital Stay (HOSPITAL_COMMUNITY)
Admission: EM | Admit: 2015-10-27 | Discharge: 2015-11-04 | DRG: 871 | Disposition: E | Payer: Medicare Other | Attending: Emergency Medicine | Admitting: Emergency Medicine

## 2015-10-27 ENCOUNTER — Telehealth: Payer: Self-pay | Admitting: Internal Medicine

## 2015-10-27 DIAGNOSIS — I2781 Cor pulmonale (chronic): Secondary | ICD-10-CM | POA: Diagnosis not present

## 2015-10-27 DIAGNOSIS — F329 Major depressive disorder, single episode, unspecified: Secondary | ICD-10-CM | POA: Diagnosis present

## 2015-10-27 DIAGNOSIS — J9601 Acute respiratory failure with hypoxia: Secondary | ICD-10-CM | POA: Diagnosis not present

## 2015-10-27 DIAGNOSIS — Z66 Do not resuscitate: Secondary | ICD-10-CM | POA: Diagnosis not present

## 2015-10-27 DIAGNOSIS — J9811 Atelectasis: Secondary | ICD-10-CM | POA: Diagnosis not present

## 2015-10-27 DIAGNOSIS — E1122 Type 2 diabetes mellitus with diabetic chronic kidney disease: Secondary | ICD-10-CM | POA: Diagnosis present

## 2015-10-27 DIAGNOSIS — M069 Rheumatoid arthritis, unspecified: Secondary | ICD-10-CM | POA: Diagnosis present

## 2015-10-27 DIAGNOSIS — I959 Hypotension, unspecified: Secondary | ICD-10-CM | POA: Diagnosis not present

## 2015-10-27 DIAGNOSIS — N184 Chronic kidney disease, stage 4 (severe): Secondary | ICD-10-CM | POA: Diagnosis not present

## 2015-10-27 DIAGNOSIS — E875 Hyperkalemia: Secondary | ICD-10-CM | POA: Insufficient documentation

## 2015-10-27 DIAGNOSIS — N189 Chronic kidney disease, unspecified: Secondary | ICD-10-CM

## 2015-10-27 DIAGNOSIS — J9621 Acute and chronic respiratory failure with hypoxia: Secondary | ICD-10-CM | POA: Diagnosis present

## 2015-10-27 DIAGNOSIS — G2581 Restless legs syndrome: Secondary | ICD-10-CM | POA: Diagnosis present

## 2015-10-27 DIAGNOSIS — K5669 Other intestinal obstruction: Secondary | ICD-10-CM

## 2015-10-27 DIAGNOSIS — K420 Umbilical hernia with obstruction, without gangrene: Secondary | ICD-10-CM | POA: Diagnosis not present

## 2015-10-27 DIAGNOSIS — E872 Acidosis, unspecified: Secondary | ICD-10-CM

## 2015-10-27 DIAGNOSIS — Z515 Encounter for palliative care: Secondary | ICD-10-CM | POA: Diagnosis not present

## 2015-10-27 DIAGNOSIS — A419 Sepsis, unspecified organism: Secondary | ICD-10-CM | POA: Diagnosis not present

## 2015-10-27 DIAGNOSIS — Z87891 Personal history of nicotine dependence: Secondary | ICD-10-CM

## 2015-10-27 DIAGNOSIS — I509 Heart failure, unspecified: Secondary | ICD-10-CM | POA: Diagnosis not present

## 2015-10-27 DIAGNOSIS — R1084 Generalized abdominal pain: Secondary | ICD-10-CM | POA: Diagnosis not present

## 2015-10-27 DIAGNOSIS — I482 Chronic atrial fibrillation: Secondary | ICD-10-CM | POA: Diagnosis present

## 2015-10-27 DIAGNOSIS — E039 Hypothyroidism, unspecified: Secondary | ICD-10-CM | POA: Diagnosis not present

## 2015-10-27 DIAGNOSIS — I272 Other secondary pulmonary hypertension: Secondary | ICD-10-CM | POA: Diagnosis not present

## 2015-10-27 DIAGNOSIS — K8020 Calculus of gallbladder without cholecystitis without obstruction: Secondary | ICD-10-CM | POA: Diagnosis not present

## 2015-10-27 DIAGNOSIS — I130 Hypertensive heart and chronic kidney disease with heart failure and stage 1 through stage 4 chronic kidney disease, or unspecified chronic kidney disease: Secondary | ICD-10-CM | POA: Diagnosis not present

## 2015-10-27 DIAGNOSIS — Z452 Encounter for adjustment and management of vascular access device: Secondary | ICD-10-CM

## 2015-10-27 DIAGNOSIS — E785 Hyperlipidemia, unspecified: Secondary | ICD-10-CM | POA: Diagnosis present

## 2015-10-27 DIAGNOSIS — Z7901 Long term (current) use of anticoagulants: Secondary | ICD-10-CM

## 2015-10-27 DIAGNOSIS — K436 Other and unspecified ventral hernia with obstruction, without gangrene: Secondary | ICD-10-CM | POA: Diagnosis not present

## 2015-10-27 DIAGNOSIS — N179 Acute kidney failure, unspecified: Secondary | ICD-10-CM | POA: Insufficient documentation

## 2015-10-27 DIAGNOSIS — N170 Acute kidney failure with tubular necrosis: Secondary | ICD-10-CM | POA: Diagnosis not present

## 2015-10-27 DIAGNOSIS — E860 Dehydration: Secondary | ICD-10-CM | POA: Diagnosis present

## 2015-10-27 DIAGNOSIS — I5032 Chronic diastolic (congestive) heart failure: Secondary | ICD-10-CM | POA: Diagnosis not present

## 2015-10-27 DIAGNOSIS — J45909 Unspecified asthma, uncomplicated: Secondary | ICD-10-CM | POA: Diagnosis present

## 2015-10-27 DIAGNOSIS — R05 Cough: Secondary | ICD-10-CM | POA: Diagnosis not present

## 2015-10-27 DIAGNOSIS — J449 Chronic obstructive pulmonary disease, unspecified: Secondary | ICD-10-CM | POA: Diagnosis not present

## 2015-10-27 DIAGNOSIS — J811 Chronic pulmonary edema: Secondary | ICD-10-CM | POA: Diagnosis not present

## 2015-10-27 DIAGNOSIS — Z79899 Other long term (current) drug therapy: Secondary | ICD-10-CM

## 2015-10-27 DIAGNOSIS — K56609 Unspecified intestinal obstruction, unspecified as to partial versus complete obstruction: Secondary | ICD-10-CM

## 2015-10-27 DIAGNOSIS — Z7984 Long term (current) use of oral hypoglycemic drugs: Secondary | ICD-10-CM

## 2015-10-27 DIAGNOSIS — Z6836 Body mass index (BMI) 36.0-36.9, adult: Secondary | ICD-10-CM | POA: Diagnosis not present

## 2015-10-27 DIAGNOSIS — E861 Hypovolemia: Secondary | ICD-10-CM | POA: Diagnosis not present

## 2015-10-27 DIAGNOSIS — R0600 Dyspnea, unspecified: Secondary | ICD-10-CM

## 2015-10-27 DIAGNOSIS — I2609 Other pulmonary embolism with acute cor pulmonale: Secondary | ICD-10-CM | POA: Diagnosis not present

## 2015-10-27 DIAGNOSIS — Z9981 Dependence on supplemental oxygen: Secondary | ICD-10-CM | POA: Diagnosis not present

## 2015-10-27 DIAGNOSIS — E662 Morbid (severe) obesity with alveolar hypoventilation: Secondary | ICD-10-CM | POA: Diagnosis present

## 2015-10-27 DIAGNOSIS — J9611 Chronic respiratory failure with hypoxia: Secondary | ICD-10-CM

## 2015-10-27 DIAGNOSIS — Z825 Family history of asthma and other chronic lower respiratory diseases: Secondary | ICD-10-CM | POA: Diagnosis not present

## 2015-10-27 DIAGNOSIS — K5660 Unspecified intestinal obstruction: Secondary | ICD-10-CM | POA: Diagnosis not present

## 2015-10-27 LAB — CBC WITH DIFFERENTIAL/PLATELET
BASOS PCT: 0 %
Basophils Absolute: 0 10*3/uL (ref 0.0–0.1)
EOS ABS: 0 10*3/uL (ref 0.0–0.7)
Eosinophils Relative: 0 %
HEMATOCRIT: 43 % (ref 36.0–46.0)
HEMOGLOBIN: 13.2 g/dL (ref 12.0–15.0)
LYMPHS ABS: 0.5 10*3/uL — AB (ref 0.7–4.0)
Lymphocytes Relative: 8 %
MCH: 25.5 pg — AB (ref 26.0–34.0)
MCHC: 30.7 g/dL (ref 30.0–36.0)
MCV: 83 fL (ref 78.0–100.0)
MONO ABS: 0.8 10*3/uL (ref 0.1–1.0)
MONOS PCT: 14 %
NEUTROS ABS: 4.3 10*3/uL (ref 1.7–7.7)
NEUTROS PCT: 78 %
Platelets: 175 10*3/uL (ref 150–400)
RBC: 5.18 MIL/uL — ABNORMAL HIGH (ref 3.87–5.11)
RDW: 20.9 % — AB (ref 11.5–15.5)
WBC: 5.6 10*3/uL (ref 4.0–10.5)

## 2015-10-27 LAB — I-STAT TROPONIN, ED
TROPONIN I, POC: 0.05 ng/mL (ref 0.00–0.08)
Troponin i, poc: 0.06 ng/mL (ref 0.00–0.08)

## 2015-10-27 LAB — URINALYSIS, ROUTINE W REFLEX MICROSCOPIC
Glucose, UA: NEGATIVE mg/dL
HGB URINE DIPSTICK: NEGATIVE
KETONES UR: 15 mg/dL — AB
NITRITE: NEGATIVE
PH: 5 (ref 5.0–8.0)
Protein, ur: 100 mg/dL — AB
SPECIFIC GRAVITY, URINE: 1.028 (ref 1.005–1.030)

## 2015-10-27 LAB — TROPONIN I: TROPONIN I: 0.05 ng/mL — AB (ref <0.031)

## 2015-10-27 LAB — GLUCOSE, CAPILLARY
GLUCOSE-CAPILLARY: 144 mg/dL — AB (ref 65–99)
Glucose-Capillary: 130 mg/dL — ABNORMAL HIGH (ref 65–99)

## 2015-10-27 LAB — COMPREHENSIVE METABOLIC PANEL
ALK PHOS: 82 U/L (ref 38–126)
ALT: 15 U/L (ref 14–54)
ANION GAP: 18 — AB (ref 5–15)
AST: 21 U/L (ref 15–41)
Albumin: 3.1 g/dL — ABNORMAL LOW (ref 3.5–5.0)
BILIRUBIN TOTAL: 0.9 mg/dL (ref 0.3–1.2)
BUN: 76 mg/dL — ABNORMAL HIGH (ref 6–20)
CALCIUM: 10.3 mg/dL (ref 8.9–10.3)
CO2: 31 mmol/L (ref 22–32)
CREATININE: 3.8 mg/dL — AB (ref 0.44–1.00)
Chloride: 90 mmol/L — ABNORMAL LOW (ref 101–111)
GFR, EST AFRICAN AMERICAN: 13 mL/min — AB (ref >60)
GFR, EST NON AFRICAN AMERICAN: 11 mL/min — AB (ref >60)
Glucose, Bld: 180 mg/dL — ABNORMAL HIGH (ref 65–99)
Potassium: 5.3 mmol/L — ABNORMAL HIGH (ref 3.5–5.1)
SODIUM: 139 mmol/L (ref 135–145)
TOTAL PROTEIN: 6.6 g/dL (ref 6.5–8.1)

## 2015-10-27 LAB — BASIC METABOLIC PANEL
ANION GAP: 19 — AB (ref 5–15)
BUN: 75 mg/dL — ABNORMAL HIGH (ref 6–20)
CALCIUM: 9.4 mg/dL (ref 8.9–10.3)
CO2: 33 mmol/L — AB (ref 22–32)
Chloride: 93 mmol/L — ABNORMAL LOW (ref 101–111)
Creatinine, Ser: 3.71 mg/dL — ABNORMAL HIGH (ref 0.44–1.00)
GFR, EST AFRICAN AMERICAN: 13 mL/min — AB (ref >60)
GFR, EST NON AFRICAN AMERICAN: 12 mL/min — AB (ref >60)
Glucose, Bld: 157 mg/dL — ABNORMAL HIGH (ref 65–99)
Potassium: 6 mmol/L — ABNORMAL HIGH (ref 3.5–5.1)
SODIUM: 145 mmol/L (ref 135–145)

## 2015-10-27 LAB — I-STAT CG4 LACTIC ACID, ED
LACTIC ACID, VENOUS: 2.38 mmol/L — AB (ref 0.5–2.0)
Lactic Acid, Venous: 3.25 mmol/L (ref 0.5–2.0)

## 2015-10-27 LAB — HEPARIN LEVEL (UNFRACTIONATED): Heparin Unfractionated: >2.2 IU/mL — ABNORMAL HIGH (ref 0.30–0.70)

## 2015-10-27 LAB — BRAIN NATRIURETIC PEPTIDE: B Natriuretic Peptide: 492.2 pg/mL — ABNORMAL HIGH (ref 0.0–100.0)

## 2015-10-27 LAB — MRSA PCR SCREENING: MRSA BY PCR: NEGATIVE

## 2015-10-27 LAB — URINE MICROSCOPIC-ADD ON

## 2015-10-27 LAB — PROCALCITONIN: PROCALCITONIN: 8.01 ng/mL

## 2015-10-27 LAB — APTT: aPTT: 40 seconds — ABNORMAL HIGH (ref 24–37)

## 2015-10-27 LAB — LACTIC ACID, PLASMA: Lactic Acid, Venous: 2 mmol/L (ref 0.5–2.0)

## 2015-10-27 MED ORDER — IPRATROPIUM-ALBUTEROL 0.5-2.5 (3) MG/3ML IN SOLN
3.0000 mL | Freq: Four times a day (QID) | RESPIRATORY_TRACT | Status: DC
  Administered 2015-10-28 – 2015-10-29 (×6): 3 mL via RESPIRATORY_TRACT
  Filled 2015-10-27 (×7): qty 3

## 2015-10-27 MED ORDER — PIPERACILLIN-TAZOBACTAM 3.375 G IVPB 30 MIN
3.3750 g | Freq: Once | INTRAVENOUS | Status: AC
  Administered 2015-10-27: 3.375 g via INTRAVENOUS
  Filled 2015-10-27: qty 50

## 2015-10-27 MED ORDER — SODIUM CHLORIDE 0.9 % IV BOLUS (SEPSIS)
1000.0000 mL | Freq: Once | INTRAVENOUS | Status: AC
  Administered 2015-10-27: 1000 mL via INTRAVENOUS

## 2015-10-27 MED ORDER — VANCOMYCIN HCL IN DEXTROSE 1-5 GM/200ML-% IV SOLN
1000.0000 mg | INTRAVENOUS | Status: DC
Start: 2015-10-29 — End: 2015-10-27

## 2015-10-27 MED ORDER — LEVALBUTEROL HCL 0.63 MG/3ML IN NEBU
0.6300 mg | INHALATION_SOLUTION | RESPIRATORY_TRACT | Status: DC | PRN
  Filled 2015-10-27: qty 3

## 2015-10-27 MED ORDER — SODIUM CHLORIDE 0.9 % IV SOLN
INTRAVENOUS | Status: DC
  Administered 2015-10-27 – 2015-10-29 (×3): via INTRAVENOUS

## 2015-10-27 MED ORDER — LEVOTHYROXINE SODIUM 100 MCG IV SOLR
25.0000 ug | Freq: Every day | INTRAVENOUS | Status: DC
  Administered 2015-10-28 – 2015-10-29 (×2): 25 ug via INTRAVENOUS
  Filled 2015-10-27 (×3): qty 5

## 2015-10-27 MED ORDER — VANCOMYCIN HCL 10 G IV SOLR
1500.0000 mg | Freq: Once | INTRAVENOUS | Status: AC
  Administered 2015-10-27: 1500 mg via INTRAVENOUS
  Filled 2015-10-27: qty 1500

## 2015-10-27 MED ORDER — LEFLUNOMIDE 20 MG PO TABS: 20.0000 mg | ORAL_TABLET | Freq: Every day | ORAL | Status: DC

## 2015-10-27 MED ORDER — ONDANSETRON HCL 4 MG/2ML IJ SOLN: 4.0000 mg | Freq: Four times a day (QID) | INTRAMUSCULAR | Status: DC | PRN

## 2015-10-27 MED ORDER — BUDESONIDE 0.5 MG/2ML IN SUSP
0.5000 mg | Freq: Two times a day (BID) | RESPIRATORY_TRACT | Status: DC
  Administered 2015-10-28 – 2015-10-29 (×3): 0.5 mg via RESPIRATORY_TRACT
  Filled 2015-10-27 (×6): qty 2

## 2015-10-27 MED ORDER — SODIUM CHLORIDE 0.9 % IV BOLUS (SEPSIS): 500.0000 mL | Freq: Once | INTRAVENOUS | Status: DC

## 2015-10-27 MED ORDER — HEPARIN (PORCINE) IN NACL 100-0.45 UNIT/ML-% IJ SOLN
1100.0000 [IU]/h | INTRAMUSCULAR | Status: DC
  Filled 2015-10-27: qty 250

## 2015-10-27 MED ORDER — SODIUM CHLORIDE 0.9 % IV SOLN: 250.0000 mL | INTRAVENOUS | Status: DC | PRN

## 2015-10-27 MED ORDER — DEXTROSE 5 % IV SOLN
30.0000 ug/min | INTRAVENOUS | Status: DC
  Administered 2015-10-27: 60 ug/min via INTRAVENOUS
  Administered 2015-10-27: 30 ug/min via INTRAVENOUS
  Administered 2015-10-28: 70 ug/min via INTRAVENOUS
  Administered 2015-10-28: 80 ug/min via INTRAVENOUS
  Filled 2015-10-27 (×5): qty 1

## 2015-10-27 MED ORDER — INSULIN ASPART 100 UNIT/ML ~~LOC~~ SOLN
0.0000 [IU] | SUBCUTANEOUS | Status: DC
  Administered 2015-10-27 – 2015-10-28 (×2): 2 [IU] via SUBCUTANEOUS
  Administered 2015-10-28: 3 [IU] via SUBCUTANEOUS
  Administered 2015-10-28 (×3): 2 [IU] via SUBCUTANEOUS
  Administered 2015-10-28: 3 [IU] via SUBCUTANEOUS
  Administered 2015-10-29: 2 [IU] via SUBCUTANEOUS
  Administered 2015-10-29: 3 [IU] via SUBCUTANEOUS
  Administered 2015-10-29 (×2): 2 [IU] via SUBCUTANEOUS

## 2015-10-27 MED ORDER — CETYLPYRIDINIUM CHLORIDE 0.05 % MT LIQD
7.0000 mL | Freq: Two times a day (BID) | OROMUCOSAL | Status: DC
  Administered 2015-10-27 – 2015-10-28 (×3): 7 mL via OROMUCOSAL

## 2015-10-27 MED ORDER — ONDANSETRON HCL 4 MG/2ML IJ SOLN
4.0000 mg | Freq: Once | INTRAMUSCULAR | Status: AC
  Administered 2015-10-27: 4 mg via INTRAVENOUS
  Filled 2015-10-27: qty 2

## 2015-10-27 NOTE — ED Notes (Signed)
critical care at bedside.

## 2015-10-27 NOTE — Telephone Encounter (Signed)
[facility] [place] [facility] Patient Name: Tracey Duncan DOB: 1946/02/20 Initial Comment Caller states has abd pain, nausea, vomiting, coughing, can't keep anything down,decreased urine output and on diuretics Nurse Assessment Nurse: Markus Daft, RN, Sherre Poot Date/Time (Eastern Time): 10/26/2015 9:35:31 AM Confirm and document reason for call. If symptomatic, describe symptoms. You must click the next button to save text entered. ---1) Caller states has lower abdominal pain, nausea, vomiting. She has a "dropped stomach and umbilical hernia". Vomited 4 times in last 24 hours. Started with N/V 2 am Sunday. Woke up with abdominal pain and nausea. No diarrhea or fever. 2) Chronic cough r/t COPD and is not worse, although she is coughing up brown mucous instead of yellow now in the past 2 days. States "always coughing up yellow mucous. 3) Decreased urine output despite being on diuretics. She has CHF and Pulmonary HTN. 4) Hands are very shaky, but not cold. Has the patient traveled out of the country within the last 30 days? ---No Does the patient have any new or worsening symptoms? ---Yes Will a triage be completed? ---Yes Related visit to physician within the last 2 weeks? ---No Does the PT have any chronic conditions? (i.e. diabetes, asthma, etc.) ---Yes List chronic conditions. 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 ---No Guidelines Guideline Title Affirmed Question Affirmed Notes Vomiting [1] MODERATE vomiting (e.g., 3 - 5 times/ day) AND [2] age > 30 Final Disposition User Go to ED Now (or PCP triage) Markus Daft, RN, [place] Comments RN unable to find appt. with any [facility] staff today. RN advised that she go to ER within the next hr. Caller verb. understanding. She states that it will take a couple of hours  as she has to have her caregiver help her get ready. She understands the advice. Going to [facility] ED. --Denies shortness of breath.-- Referrals REFERRED TO PCP OFFICE Disagree/Comply: Comply

## 2015-10-27 NOTE — Progress Notes (Signed)
eLink Physician-Brief Progress Note Patient Name: Tracey Duncan DOB: 03-Dec-1945 MRN: [id number]   Date of Service  11/02/2015  HPI/Events of Note  Called by Dr. Donne Hazel who requests that the Heparin IV infusion be D/Ced as he feels that the patient will require surgery in the AM  eICU Interventions  Will D/C Heparin IV infusion.      Intervention Category Intermediate Interventions: Coagulopathy - evaluation and management  Tracey Duncan Eugene 10/23/2015, 9:28 PM

## 2015-10-27 NOTE — ED Notes (Signed)
Family at bedside. 

## 2015-10-27 NOTE — Consult Note (Signed)
Reason for Consult:abd pain  Referring Physician: Dr Chanda Busing is an 70 y.o. female.  HPI: 22 yof on eliquis for afib, copd on 8 liters of oxygen at home among other issues who presents with known ventral hernia.  She has had since this weekend n/v, some flatus and abdominal pain. Her hernia is tender also.  She has not been urinating much.  She underwent ct scan and i was called.     Past Medical History  Diagnosis Date  . Hypertension   . Atrial fibrillation (HCC)     failed cardioversion, on anticoag  . OSA on CPAP   . Asthma   . Hyperlipidemia   . COPD (chronic obstructive pulmonary disease) (HCC)     with pulm HTN - on 6-8LPM O2  . Chronic diastolic CHF (congestive heart failure) (HCC)   . Cor pulmonale (HCC)   . Obesity hypoventilation syndrome (HCC)   . Diabetes type 2, controlled (HCC)   . Pulmonary arterial hypertension (HCC)   . Secondary PAH   . Unspecified hypothyroidism   . RLS (restless legs syndrome) 08/20/2014    Noted 07/2014 sleep study    Past Surgical History  Procedure Laterality Date  . Cardioversion    . Tonsillectomy  1955  . Abdominal hysterectomy  2004  . Right heart catheterization N/A 07/11/2013    Procedure: RIGHT HEART CATH;  Surgeon: Laurey Morale, MD;  Location: [facility] CATH LAB;  Service: Cardiovascular;  Laterality: N/A;  . Right heart catheterization N/A 02/06/2014    Procedure: RIGHT HEART CATH;  Surgeon: Laurey Morale, MD;  Location: [facility] CATH LAB;  Service: Cardiovascular;  Laterality: N/A;  . Cardiac catheterization N/A 01/26/2015    Procedure: Right Heart Cath;  Surgeon: Laurey Morale, MD;  Location: [facility] INVASIVE CV LAB;  Service: Cardiovascular;  Laterality: N/A;    Family History  Problem Relation Age of Onset  . Emphysema Mother   . Allergies Mother   . Arthritis Mother   . Heart disease Father   . Colon polyps Father   . Heart disease Brother   . Tongue cancer Paternal Grandmother   . Ovarian cancer Cousin   . Breast  cancer Cousin   . Diabetes Sister     Social History:  reports that she quit smoking about 24 years ago. Her smoking use included Cigarettes. She has a 42 pack-year smoking history. She has never used smokeless tobacco. She reports that she drinks alcohol. She reports that she does not use illicit drugs.  Allergies: No Known Allergies  Medications: I have reviewed the patient's current medications.  Results for orders placed or performed during the hospital encounter of 11/04/2015 (from the past 48 hour(s))  Comprehensive metabolic panel     Status: Abnormal   Collection Time: Nov 04, 2015 12:41 PM  Result Value Ref Range   Sodium 139 135 - 145 mmol/L   Potassium 5.3 (H) 3.5 - 5.1 mmol/L   Chloride 90 (L) 101 - 111 mmol/L   CO2 31 22 - 32 mmol/L   Glucose, Bld 180 (H) 65 - 99 mg/dL   BUN 76 (H) 6 - 20 mg/dL   Creatinine, Ser 1.61 (H) 0.44 - 1.00 mg/dL   Calcium 09.6 8.9 - 04.5 mg/dL   Total Protein 6.6 6.5 - 8.1 g/dL   Albumin 3.1 (L) 3.5 - 5.0 g/dL   AST 21 15 - 41 U/L   ALT 15 14 - 54 U/L   Alkaline Phosphatase 82 38 -  126 U/L   Total Bilirubin 0.9 0.3 - 1.2 mg/dL   GFR calc non Af Amer 11 (L) >60 mL/min   GFR calc Af Amer 13 (L) >60 mL/min    Comment: (NOTE) The eGFR has been calculated using the CKD EPI equation. This calculation has not been validated in all clinical situations. eGFR's persistently <60 mL/min signify possible Chronic Kidney Disease.    Anion gap 18 (H) 5 - 15  CBC with Differential     Status: Abnormal   Collection Time: 10/22/2015 12:41 PM  Result Value Ref Range   WBC 5.6 4.0 - 10.5 K/uL   RBC 5.18 (H) 3.87 - 5.11 MIL/uL   Hemoglobin 13.2 12.0 - 15.0 g/dL   HCT 40.9 81.1 - 91.4 %   MCV 83.0 78.0 - 100.0 fL   MCH 25.5 (L) 26.0 - 34.0 pg   MCHC 30.7 30.0 - 36.0 g/dL   RDW 78.2 (H) 95.6 - 21.3 %   Platelets 175 150 - 400 K/uL   Neutrophils Relative % 78 %   Neutro Abs 4.3 1.7 - 7.7 K/uL   Lymphocytes Relative 8 %   Lymphs Abs 0.5 (L) 0.7 - 4.0 K/uL    Monocytes Relative 14 %   Monocytes Absolute 0.8 0.1 - 1.0 K/uL   Eosinophils Relative 0 %   Eosinophils Absolute 0.0 0.0 - 0.7 K/uL   Basophils Relative 0 %   Basophils Absolute 0.0 0.0 - 0.1 K/uL  I-Stat CG4 Lactic Acid, ED (Not at [facility])     Status: Abnormal   Collection Time: 10/28/2015 12:45 PM  Result Value Ref Range   Lactic Acid, Venous 3.25 (HH) 0.5 - 2.0 mmol/L   Comment NOTIFIED PHYSICIAN   Brain natriuretic peptide     Status: Abnormal   Collection Time: 10/12/2015  3:04 PM  Result Value Ref Range   B Natriuretic Peptide 492.2 (H) 0.0 - 100.0 pg/mL  I-Stat Troponin, ED - 0, 3, 6 hours (not at [facility])     Status: None   Collection Time: 10/10/2015  3:16 PM  Result Value Ref Range   Troponin i, poc 0.06 0.00 - 0.08 ng/mL   Comment 3            Comment: Due to the release kinetics of cTnI, a negative result within the first hours of the onset of symptoms does not rule out myocardial infarction with certainty. If myocardial infarction is still suspected, repeat the test at appropriate intervals.   I-Stat CG4 Lactic Acid, ED (Not at [facility])     Status: Abnormal   Collection Time: 10/13/2015  3:18 PM  Result Value Ref Range   Lactic Acid, Venous 2.38 (HH) 0.5 - 2.0 mmol/L   Comment NOTIFIED PHYSICIAN     Ct Abdomen Pelvis Wo Contrast  10/25/2015  CLINICAL DATA:  Abdominal pain for 3 days EXAM: CT ABDOMEN AND PELVIS WITHOUT CONTRAST TECHNIQUE: Multidetector CT imaging of the abdomen and pelvis was performed following the standard protocol without IV contrast. COMPARISON:  03/16/2015 FINDINGS: A large ventral hernia sac containing small bowel loops is present. This results an small bowel obstruction with dilated proximal small bowel loops. On images 26 and 31 of series 11, a small segment of small bowel is obstructed into adjacent locations, within close proximity resulting in a closed loop obstruction. This is also seen on sagittal images. See image 105 of series 9. There is no free  intraperitoneal gas. There is stranding in the mesenteric suggesting venous congestion. There  is is stranding within the fat contained in the hernia sac. Stranding in the overlying subcutaneous fatty tissue is again noted. Bibasilar atelectasis versus airspace disease. Confluent opacity at the posterior lung bases is worrisome for bibasilar aspiration pneumonia. There is a small amount of free fluid surrounding the liver. Liver is otherwise unremarkable. Multiple gallstones are noted. Spleen, pancreas, adrenal glands are within normal limits. Severe atrophy of the left kidney. Chronic changes of the right kidney are noted The stomach is distended and contains fluid Bladder is decompressed. Uterus is absent. Adnexa are unremarkable. Sigmoid diverticulosis. Abdominal aortic aneurysm is present. Maximal diameter is 3.5 cm. Vascular calcifications are noted No vertebral compression deformity. Multilevel degenerative disc disease. IMPRESSION: The large ventral hernia containing small bowel loops is causing a small bowel obstruction. There is an associated closed loop obstruction. Hernia sac and mesenteric fatty tissues suggesting venous congestion. Cholelithiasis Small amount of free fluid about the liver Bibasilar consolidation.  Consider aspiration. Critical Value/emergent results were called by telephone at the time of interpretation on 10/07/2015 at 4:16 pm to Dr. Joanie Coddington, who verbally acknowledged these results. Electronically Signed   By: Jolaine Click M.D.   On: 10/12/2015 16:16   Dg Chest 2 View  11/03/2015  CLINICAL DATA:  Hypotension and cough EXAM: CHEST  2 VIEW COMPARISON:  01/23/2015 FINDINGS: Chronic cardiopericardial enlargement. Diffuse interstitial coarsening compatible with mild edema. No effusions. Band of atelectasis seen in the lateral projection, likely along the right lower major fissure. Hyperinflation correlating with COPD history. IMPRESSION: 1. Mild pulmonary edema. 2. COPD.  Electronically Signed   By: Marnee Spring M.D.   On: 10/25/2015 13:03    Review of Systems  Constitutional: Negative for fever and chills.  Respiratory: Positive for shortness of breath.   Cardiovascular: Negative for chest pain.  Gastrointestinal: Positive for nausea, vomiting, abdominal pain and constipation. Negative for diarrhea and blood in stool.   Blood pressure 86/60, pulse 118, temperature 98.4 F (36.9 C), resp. rate 18, height 5\' 4"  (1.626 m), weight 87.998 kg (194 lb), SpO2 88 %. Physical Exam  Vitals reviewed. Constitutional: She appears well-developed and well-nourished.  HENT:  Head: Normocephalic.  Eyes: No scleral icterus.  Cardiovascular: An irregularly irregular rhythm present.  Respiratory: She has wheezes.  GI: There is tenderness in the periumbilical area. A hernia is present. Hernia confirmed positive in the ventral area.  Soft some bs she has umbilical ventral hernia with bowel present that is not reducible, only mildly tender     Assessment/Plan: psbo due to ventral hernia  I think under other circumstances I would take her to or.  Due to comorbidities and taking eliquis this am I think best course is going to be to manage her conservatively. I think her operative risk is significant.  She could develop worse symptoms from hernia as well though.  Will place ng tube, npo, medical team to admit and we will follow. She needs aggressive resuscitation due to dehydration and elevated creatinine. Would recommend monitoring closely.  If she worsens will consider surgery. At least she can be resuscitated and eliquis can wear off if she does needs surgery.    Jessye Imhoff 10/08/2015, 5:14 PM

## 2015-10-27 NOTE — H&P (Signed)
PULMONARY / CRITICAL CARE MEDICINE   Name: Tracey Duncan MRN: [id number] DOB: 1946/06/15    ADMISSION DATE:  10/23/2015 CONSULTATION DATE:  10/15/2015  REFERRING MD:  EDP  CHIEF COMPLAINT:  Abd pain  HISTORY OF PRESENT ILLNESS:  Tracey Duncan is a 70 y.o. 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 Lamonte Sakai as well as heart failure team Dr. Aundra Dubin and Dr. Haroldine Laws).  She presented to [facility] ED 02/21 with abdominal pain, nausea, vomiting.  Symptoms began 3 days prior in the middle of the night and have persisted.  She didn't think much of symptoms initially, but 3 days later they continued and she also developed increased WOB.  During this time, she had decreased PO intake as she was unable to keep any solids or liquids down.  She called PCP 02/21 who advised that she come to ED for further evaluation.  In ED, CT of the abdomen revealed SBO.  She was seen in consultation by general surgery who recommended medical / conservative management.  In addition to her SBO, she had high O2 demands (NRB mask) as well as hypotension that was minimally responsive to fluids.  Lactate was initially elevated at 3.25 and decreased to 2.38 on repeat.  She has not had any fevers/chills/sweats and lab work reveals no leukocytosis.  She denies any headaches, chest pain, diarrhea, myalgias.  Last BM was 4 days ago (and she typically has 3 - 4 BM's per day).  She has had productive cough; however, she states this is chronic for her.  After 3L IVF, SBP remained in 70's; therefore, PCCM called for admission.  PAST MEDICAL HISTORY :  She  has a past medical history of Hypertension; Atrial fibrillation ([place]); OSA on CPAP; Asthma; Hyperlipidemia; COPD (chronic obstructive pulmonary disease) ([place]); Chronic diastolic CHF (congestive heart failure) ([place]); Cor pulmonale ([place]); Obesity hypoventilation syndrome ([place]); Diabetes type 2, controlled ([place]); Pulmonary arterial hypertension  ([place]); Secondary PAH; Unspecified hypothyroidism; and RLS (restless legs syndrome) (08/20/2014).  PAST SURGICAL HISTORY: She  has past surgical history that includes Cardioversion; Tonsillectomy (1955); Abdominal hysterectomy (2004); right heart catheterization (N/A, 07/11/2013); right heart catheterization (N/A, 02/06/2014); and Cardiac catheterization (N/A, 01/26/2015).  No Known Allergies  No current facility-administered medications on file prior to encounter.   Current Outpatient Prescriptions on File Prior to Encounter  Medication Sig  . acetaminophen (TYLENOL) 500 MG tablet Take 1,000 mg by mouth daily as needed (for arthritic pain).  . ADVAIR DISKUS 250-50 MCG/DOSE AEPB INHALE 1 PUFF INTO THE LUNGS EVERY 12 HOURS.  Marland Kitchen albuterol (PROAIR HFA) 108 (90 BASE) MCG/ACT inhaler Inhale 2 puffs into the lungs every 4 (four) hours as needed for wheezing or shortness of breath.   . bisoprolol (ZEBETA) 10 MG tablet TAKE 1 TABLET EVERY DAY  . calcium-vitamin D (OSCAL WITH D) 500-200 MG-UNIT per tablet Take 1 tablet by mouth daily.  . cetirizine (ZYRTEC) 10 MG tablet Take 10 mg by mouth at bedtime.   . cholecalciferol (VITAMIN D) 1000 UNITS tablet Take 1,000 Units by mouth daily.  . colchicine 0.6 MG tablet Take 0.6 mg by mouth daily as needed (gouty flare).  . DENTAGEL 1.1 % GEL dental gel Place 1 application onto teeth at bedtime. Reported on 10/01/2015  . ELIQUIS 5 MG TABS tablet TAKE 1 TABLET BY MOUTH 2 TIMES DAILY.  . Febuxostat (ULORIC) 80 MG TABS Take 80 mg by mouth daily.  Marland Kitchen gabapentin (NEURONTIN) 300 MG  capsule Take 300 mg by mouth 2 (two) times daily.  . hydrocortisone cream 1 % Apply 1 application topically 2 (two) times daily.  Marland Kitchen ipratropium-albuterol (DUONEB) 0.5-2.5 (3) MG/3ML SOLN Take 3 mLs by nebulization every 4 (four) hours as needed (for wheezing or shortness of breath).   . leflunomide (ARAVA) 20 MG tablet Take 20 mg by mouth daily.   Marland Kitchen levothyroxine (SYNTHROID, LEVOTHROID) 50 MCG  tablet Take 1 tablet (50 mcg total) by mouth daily before breakfast.  . Macitentan (OPSUMIT) 10 MG TABS Take 10 mg by mouth daily.  . Magnesium 400 MG CAPS Take 1 capsule by mouth daily.  . metFORMIN (GLUCOPHAGE) 500 MG tablet Take 1 tablet (500 mg total) by mouth 2 (two) times daily with a meal.  . metolazone (ZAROXOLYN) 2.5 MG tablet Take 2.5 mg of Metolazone on Tuesday and Friday  . montelukast (SINGULAIR) 10 MG tablet TAKE 1 TABLET BY MOUTH AT BEDTIME  . Multiple Vitamins-Minerals (CENTRUM SILVER PO) Take 1 tablet by mouth daily.  . Polyvinyl Alcohol-Povidone (REFRESH OP) Place 1 drop into both eyes daily as needed (for dry eyes).  . potassium chloride SA (KLOR-CON M20) 20 MEQ tablet Take 2 tablets (40 mEq total) by mouth 2 (two) times daily. take an extra 20 meq (1 tab) on Wed with Metolazone  . pravastatin (PRAVACHOL) 20 MG tablet TAKE 1 TABLET BY MOUTH EVERY EVENING  . predniSONE (DELTASONE) 10 MG tablet Days 1-3 take 5 pills, days 4-7 take 4 pills, days 8-10 take 3 pills, days 11-13 take 2 pills, days 14-16 take 1 pill, then resume 5 mg/day. (Patient taking differently: Take 5 mg by mouth daily with breakfast. Days 1-3 take 5 pills, days 4-7 take 4 pills, days 8-10 take 3 pills, days 11-13 take 2 pills, days 14-16 take 1 pill, then resume 5 mg/day.)  . PRESCRIPTION MEDICATION Oxygen.   Pt uses 6-8 liters of oxygen daily.  . sertraline (ZOLOFT) 50 MG tablet TAKE 1 TABLET BY MOUTH AT BEDTIME (NEED APPT)  . SPIRIVA HANDIHALER 18 MCG inhalation capsule INHALE 1 CAPSULE VIA HANDIHALER ONCE DAILY AT THE SAME TIME EVERY DAY  . Tadalafil, PAH, (ADCIRCA) 20 MG TABS Take 2 tablets (40 mg total) by mouth daily.  . Throat Lozenges (SALESE MT) Use as directed 1 lozenge in the mouth or throat daily as needed (dry mouth). Reported on 10/01/2015  . torsemide (DEMADEX) 20 MG tablet Take 4 tablets (80 mg total) by mouth 2 (two) times daily.  . traMADol (ULTRAM) 50 MG tablet Take 100 mg by mouth as needed for  moderate pain.     FAMILY HISTORY:  Her has no family status information on file.   SOCIAL HISTORY: She  reports that she quit smoking about 24 years ago. Her smoking use included Cigarettes. She has a 42 pack-year smoking history. She has never used smokeless tobacco. She reports that she drinks alcohol. She reports that she does not use illicit drugs.  REVIEW OF SYSTEMS:   All negative; except for those that are bolded, which indicate positives.  Constitutional: weight loss, weight gain, night sweats, fevers, chills, fatigue, weakness.  HEENT: headaches, sore throat, sneezing, nasal congestion, post nasal drip, difficulty swallowing, tooth/dental problems, visual complaints, visual changes, ear aches. Neuro: difficulty with speech, weakness, numbness, ataxia. CV:  chest pain, orthopnea, PND, swelling in lower extremities, dizziness, palpitations, syncope.  Resp: cough, hemoptysis, dyspnea, wheezing. GI  heartburn, indigestion, abdominal pain, nausea, vomiting, diarrhea, constipation, change in bowel habits, loss of appetite,  hematemesis, melena, hematochezia, decreased PO intake due to inability to keep anything down. GU: dysuria, change in color of urine, urgency or frequency, flank pain, hematuria. MSK: joint pain or swelling, decreased range of motion. Psych: change in mood or affect, depression, anxiety, suicidal ideations, homicidal ideations. Skin: rash, itching, bruising.   SUBJECTIVE:  Breathing is improved on NRB.  Denies SOB, chest pain.  Still has mild nausea but no further vomiting.  VITAL SIGNS: BP 88/65 mmHg  Pulse 83  Temp(Src) 98.4 F (36.9 C)  Resp 18  Ht _0  (1.626 m)  Wt 87.998 kg (194 lb)  BMI 33.28 kg/m2  SpO2 96%  HEMODYNAMICS:    VENTILATOR SETTINGS:    INTAKE / OUTPUT:     PHYSICAL EXAMINATION: General: Adult female, in NAD. Neuro: A&O x 3, non-focal.  HEENT: [place]/AT. PERRL, sclerae anicteric.  MM dry. Cardiovascular: IRIR, tachy, no M/R/G.   Lungs: Respirations even and unlabored.  Coarse bilaterally with faint crackles. Abdomen: BS hypoactive.  Abd soft with no rebound or guarding. Musculoskeletal: No gross deformities, no edema.  Skin: Intact, warm, no rashes.  LABS:  BMET  Recent Labs Lab 10/23/15 0945 10/11/2015 1241  NA 141 139  K 4.8 5.3*  CL 96* 90*  CO2 32 31  BUN 47* 76*  CREATININE 2.01* 3.80*  GLUCOSE 157* 180*    Electrolytes  Recent Labs Lab 10/23/15 0945 10/14/2015 1241  CALCIUM 9.4 10.3    CBC  Recent Labs Lab 10/12/2015 1241  WBC 5.6  HGB 13.2  HCT 43.0  PLT 175    Coag's No results for input(s): APTT, INR in the last 168 hours.  Sepsis Markers  Recent Labs Lab 10/11/2015 1245 11/01/2015 1518  LATICACIDVEN 3.25* 2.38*    ABG No results for input(s): PHART, PCO2ART, PO2ART in the last 168 hours.  Liver Enzymes  Recent Labs Lab 10/26/2015 1241  AST 21  ALT 15  ALKPHOS 82  BILITOT 0.9  ALBUMIN 3.1*    Cardiac Enzymes No results for input(s): TROPONINI, PROBNP in the last 168 hours.  Glucose No results for input(s): GLUCAP in the last 168 hours.  Imaging Ct Abdomen Pelvis Wo Contrast  10/14/2015  CLINICAL DATA:  Abdominal pain for 3 days EXAM: CT ABDOMEN AND PELVIS WITHOUT CONTRAST TECHNIQUE: Multidetector CT imaging of the abdomen and pelvis was performed following the standard protocol without IV contrast. COMPARISON:  03/16/2015 FINDINGS: A large ventral hernia sac containing small bowel loops is present. This results an small bowel obstruction with dilated proximal small bowel loops. On images 26 and 31 of series 11, a small segment of small bowel is obstructed into adjacent locations, within close proximity resulting in a closed loop obstruction. This is also seen on sagittal images. See image 105 of series 9. There is no free intraperitoneal gas. There is stranding in the mesenteric suggesting venous congestion. There is is stranding within the fat contained in the  hernia sac. Stranding in the overlying subcutaneous fatty tissue is again noted. Bibasilar atelectasis versus airspace disease. Confluent opacity at the posterior lung bases is worrisome for bibasilar aspiration pneumonia. There is a small amount of free fluid surrounding the liver. Liver is otherwise unremarkable. Multiple gallstones are noted. Spleen, pancreas, adrenal glands are within normal limits. Severe atrophy of the left kidney. Chronic changes of the right kidney are noted The stomach is distended and contains fluid Bladder is decompressed. Uterus is absent. Adnexa are unremarkable. Sigmoid diverticulosis. Abdominal aortic aneurysm is present. Maximal diameter is  3.5 cm. Vascular calcifications are noted No vertebral compression deformity. Multilevel degenerative disc disease. IMPRESSION: The large ventral hernia containing small bowel loops is causing a small bowel obstruction. There is an associated closed loop obstruction. Hernia sac and mesenteric fatty tissues suggesting venous congestion. Cholelithiasis Small amount of free fluid about the liver Bibasilar consolidation.  Consider aspiration. Critical Value/emergent results were called by telephone at the time of interpretation on 11/01/2015 at 4:16 pm to Dr. Raynelle Highland, who verbally acknowledged these results. Electronically Signed   By: Marybelle Killings M.D.   On: 10/22/2015 16:16   Dg Chest 2 View  10/15/2015  CLINICAL DATA:  Hypotension and cough EXAM: CHEST  2 VIEW COMPARISON:  01/23/2015 FINDINGS: Chronic cardiopericardial enlargement. Diffuse interstitial coarsening compatible with mild edema. No effusions. Band of atelectasis seen in the lateral projection, likely along the right lower major fissure. Hyperinflation correlating with COPD history. IMPRESSION: 1. Mild pulmonary edema. 2. COPD. Electronically Signed   By: Monte Fantasia M.D.   On: 10/25/2015 13:03     STUDIES:  CT A / P 02/21 > large ventral hernia containing small bowel  loop causing SBO with associated closed loop obstruction. Cholelithiasis with small amount of free fluid about the liver. CXR 02/21 > COPD, chronic changes.  CULTURES: Blood 02/21 > Urine 02/21 >  ANTIBIOTICS: Vanc and Zosyn x 1 in ED.  SIGNIFICANT EVENTS: 02/21 > admitted with SBO and hypotension (favored due to hypovolemia from decreased PO intake).  LINES/TUBES: CVL pending 02/22 > NGT R Nare 2/21 > PIV x3  ASSESSMENT / PLAN:  PULMONARY A: Acute on chronic hypoxemic respiratory failure - on 6-8L home O2. Hx O2 dependent COPD, OSA with probable component OHS, PAH (on opsumit and adcirca). Former smoker. P:   Continue supplemental O2 as needed to maintain SpO2 > 92%. DuoNebs / Levalbuterol / Budesonide. HOld outpatient advair, spiriva, albuterol.  CARDIOVASCULAR A:  Hypotension - favor due to hypovolemia from decreased PO intake.  Despite SIRS, doubt sepsis as no hx to support, no fever, no leukocytosis. Hx A.fib - failed DCCV in 2004 and has since been on xarelto. Hx HTN, HLD, cor pulmonale. P:  Caution with aggressive IVF resuscitation given her respiratory status. CVL pending. Neosynephrine as needed to maintain SBP > 90 or MAP > 65. Assess CVP's. Repeat troponin, lactate. Start heparin gtt in lieu of outpatient eliquis. Hold outpatient bisoprolol, eliquis, macitentan metolazone, adcirca, torsemide.  RENAL A:   AoCKD. Hyperkalemia P:   NS @ 100. Repeat BMP now and in AM.  GASTROINTESTINAL A:   Chronic ventral hernia - now with SBO. GI prophylaxis. Nutrition. P:   General surgery following, appreciate the assistance. Continue medical / conservative therapies for now. NPO.  HEMATOLOGIC A:   On chronic anticoagulation due to A.fib. VTE Prophylaxis. P:  Heparin gtt in lieu of outpatient eliquis SCD's / heparin gtt. CBC in AM.  INFECTIOUS A:   Hypotension - favor due to hypovolemia from decreased PO intake.  Despite SIRS, doubt sepsis as no hx  to support, no fever, no leukocytosis. P:   Defer abx for now. Follow cultures as above. Assess PCT algorithm - if high, then add abx.  ENDOCRINE A:   DM. Hypothyroidism.  P:   SSI. Continue outpatient synthroid, change to IV formulation. Hold outpatient metformin.  NEUROLOGIC A:   Hx depression, RLS. P:   Hold outpatient gabapentin, metaxalone, oxycodone, zoloft.  AUTOIMMUNE A: Hx RA - on leflunomide. P: [place].   Family updated:  Sister at bedside.  Interdisciplinary Family Meeting v Palliative Care Meeting:  Due by: 02/27.  CC time: 40 minutes.   Montey Hora, [place] [facility] Pager: [phone]  or [phone] 10/18/2015, 6:48 PM   PCCM Attending Note: Patient seen and examined. I reviewed 77 assistant admission H&P. Please refer to his note. A 70 year old female with chronic medical problems including pulmonary hypertension, chronic hypoxic respiratory failure, chronic renal disease, and atrial fibrillation on systemic anticoagulation with Eliquis. Patient admitted with small bowel obstruction and acute on chronic renal failure. Continuing to require low-dose vasopressor infusion. Lactic acid is improving. Closely monitoring hyperkalemia and electrolyte panel. Plan for central line placement for continued vasopressor infusion while avoiding further IV fluid boluses with patient's tenuous respiratory status. CODE STATUS discussed previously by Mr. Hoffman with patient and family. All were reportedly in agreement with a DO NOT RESUSCITATE/DO NOT INTUBATE status. Mental status at this time is intact. Procalcitonin elevated therefore adding empiric vancomycin & Zosyn.  I have spent a total of 34 minutes of critical care time today caring for the patient and reviewing the patient's electronic medical record.  Sonia Baller Ashok Cordia, M.D. [place] [facility] Pager:  [phone] After 3pm or if no response,  call [phone]

## 2015-10-27 NOTE — ED Notes (Signed)
MD at bedside. 

## 2015-10-27 NOTE — ED Notes (Signed)
CT called to expedite scan per MD.

## 2015-10-27 NOTE — Progress Notes (Signed)
PCCM INTERVAL PROGRESS NOTE  Long discussion with patient and family. She feels that if she were to decompensate overnight, she would not want CPR or mechanical ventilation. If surgery is needed and she would require elective intubation, this is something she would consider.   Critical care time 30 mins   Georgann Housekeeper, AGACNP-BC [facility] Pulmonology/Critical Care Pager [phone] or [phone]  10/17/2015 9:41 PM

## 2015-10-27 NOTE — ED Notes (Signed)
MD placed pt on 8L [place] to see if she could tolerate. pts sats dropped to 79%-82% pt placed back on NRB mask per MD.

## 2015-10-27 NOTE — ED Provider Notes (Addendum)
CSN: [id number]     Arrival date & time 10/14/2015  1126 History   First MD Initiated Contact with Patient 11/02/2015 1346     Chief Complaint  Patient presents with  . Abdominal Pain  . Vomiting     (Consider location/radiation/quality/duration/timing/severity/associated sxs/prior Treatment) Patient is a 70 y.o. female presenting with abdominal pain.  Abdominal Pain Pain location:  Generalized Pain quality: cramping   Pain radiates to:  Does not radiate Pain severity:  Severe Duration:  2 days Timing:  Constant Progression:  Unchanged Chronicity:  New Relieved by:  Nothing Worsened by:  Nothing tried Ineffective treatments:  None tried Associated symptoms: constipation, cough (chronic unchanged), fatigue, nausea, shortness of breath (no change in baseline) and vomiting (was green then brown)   Associated symptoms: no chest pain, no chills, no diarrhea, no dysuria, no fever, no sore throat, no vaginal bleeding and no vaginal discharge     Past Medical History  Diagnosis Date  . Hypertension   . Atrial fibrillation ([place])     failed cardioversion, on anticoag  . OSA on CPAP   . Asthma   . Hyperlipidemia   . COPD (chronic obstructive pulmonary disease) (HCC)     with pulm HTN - on 6-8LPM O2  . Chronic diastolic CHF (congestive heart failure) ([place])   . Cor pulmonale ([place])   . Obesity hypoventilation syndrome ([place])   . Diabetes type 2, controlled ([place])   . Pulmonary arterial hypertension ([place])   . Secondary PAH   . Unspecified hypothyroidism   . RLS (restless legs syndrome) 08/20/2014    Noted 07/2014 sleep study   Past Surgical History  Procedure Laterality Date  . Cardioversion    . Tonsillectomy  1955  . Abdominal hysterectomy  2004  . Right heart catheterization N/A 07/11/2013    Procedure: RIGHT HEART CATH;  Surgeon: Larey Dresser, MD;  Location: [facility] CATH LAB;  Service: Cardiovascular;  Laterality: N/A;  . Right heart catheterization N/A 02/06/2014    Procedure:  RIGHT HEART CATH;  Surgeon: Larey Dresser, MD;  Location: [facility] CATH LAB;  Service: Cardiovascular;  Laterality: N/A;  . Cardiac catheterization N/A 01/26/2015    Procedure: Right Heart Cath;  Surgeon: Larey Dresser, MD;  Location: [place] CV LAB;  Service: Cardiovascular;  Laterality: N/A;   Family History  Problem Relation Age of Onset  . Emphysema Mother   . Allergies Mother   . Arthritis Mother   . Heart disease Father   . Colon polyps Father   . Heart disease Brother   . Tongue cancer Paternal Grandmother   . Ovarian cancer Cousin   . Breast cancer Cousin   . Diabetes Sister    Social History  Substance Use Topics  . Smoking status: Former Smoker -- 1.50 packs/day for 28 years    Types: Cigarettes    Quit date: 09/06/1991  . Smokeless tobacco: Never Used  . Alcohol Use: 0.0 oz/week    0 Standard drinks or equivalent per week     Comment: "very seldom" 09/26/12   OB History    No data available     Review of Systems  Constitutional: Positive for fatigue. Negative for fever and chills.  HENT: Negative for sore throat.   Eyes: Negative for visual disturbance.  Respiratory: Positive for cough (chronic unchanged) and shortness of breath (no change in baseline).   Cardiovascular: Negative for chest pain.  Gastrointestinal: Positive for nausea, vomiting (was green then brown), abdominal pain and constipation.  Negative for diarrhea.  Genitourinary: Negative for dysuria, vaginal bleeding, vaginal discharge and difficulty urinating.  Musculoskeletal: Negative for back pain and neck pain.  Skin: Negative for rash.  Neurological: Negative for syncope and headaches.      Allergies  Review of patient's allergies indicates no known allergies.  Home Medications   Prior to Admission medications   Medication Sig Start Date End Date Taking? Authorizing Provider  acetaminophen (TYLENOL) 500 MG tablet Take 1,000 mg by mouth daily as needed (for arthritic pain).   Yes  Historical Provider, MD  ADVAIR DISKUS 250-50 MCG/DOSE AEPB INHALE 1 PUFF INTO THE LUNGS EVERY 12 HOURS. 08/27/15  Yes Collene Gobble, MD  albuterol (PROAIR HFA) 108 (90 BASE) MCG/ACT inhaler Inhale 2 puffs into the lungs every 4 (four) hours as needed for wheezing or shortness of breath.    Yes Historical Provider, MD  Artificial Saliva (SALESE/XYLITOL MT) Use as directed 1 Dose in the mouth or throat as needed (dry mouth).   Yes Historical Provider, MD  bisoprolol (ZEBETA) 10 MG tablet TAKE 1 TABLET EVERY DAY 03/24/15  Yes Larey Dresser, MD  calcium-vitamin D (OSCAL WITH D) 500-200 MG-UNIT per tablet Take 1 tablet by mouth daily.   Yes Historical Provider, MD  cetirizine (ZYRTEC) 10 MG tablet Take 10 mg by mouth at bedtime.    Yes Historical Provider, MD  cholecalciferol (VITAMIN D) 1000 UNITS tablet Take 1,000 Units by mouth daily.   Yes Historical Provider, MD  colchicine 0.6 MG tablet Take 0.6 mg by mouth daily as needed (gouty flare).   Yes Historical Provider, MD  DENTAGEL 1.1 % GEL dental gel Place 1 application onto teeth at bedtime. Reported on 10/01/2015 12/13/14  Yes Historical Provider, MD  ELIQUIS 5 MG TABS tablet TAKE 1 TABLET BY MOUTH 2 TIMES DAILY. 09/10/15  Yes Larey Dresser, MD  Febuxostat (ULORIC) 80 MG TABS Take 80 mg by mouth daily.   Yes Historical Provider, MD  fluticasone (FLONASE) 50 MCG/ACT nasal spray Place 1 spray into both nostrils daily.   Yes Historical Provider, MD  gabapentin (NEURONTIN) 300 MG capsule Take 300 mg by mouth 2 (two) times daily.   Yes Historical Provider, MD  hydrocortisone cream 1 % Apply 1 application topically 2 (two) times daily. 10/01/15  Yes Hoyt Koch, MD  ipratropium-albuterol (DUONEB) 0.5-2.5 (3) MG/3ML SOLN Take 3 mLs by nebulization every 4 (four) hours as needed (for wheezing or shortness of breath).    Yes Historical Provider, MD  leflunomide (ARAVA) 20 MG tablet Take 20 mg by mouth daily.  02/09/15  Yes Historical Provider, MD   levothyroxine (SYNTHROID, LEVOTHROID) 50 MCG tablet Take 1 tablet (50 mcg total) by mouth daily before breakfast. 12/22/14  Yes Rowe Clack, MD  Macitentan (OPSUMIT) 10 MG TABS Take 10 mg by mouth daily. 06/09/15  Yes Larey Dresser, MD  Magnesium 400 MG CAPS Take 1 capsule by mouth daily.   Yes Historical Provider, MD  metaxalone (SKELAXIN) 800 MG tablet Take 800 mg by mouth once.   Yes Historical Provider, MD  metFORMIN (GLUCOPHAGE) 500 MG tablet Take 1 tablet (500 mg total) by mouth 2 (two) times daily with a meal. 12/22/14  Yes Rowe Clack, MD  metolazone (ZAROXOLYN) 2.5 MG tablet Take 2.5 mg of Metolazone on Tuesday and Friday 08/07/15  Yes Larey Dresser, MD  montelukast (SINGULAIR) 10 MG tablet TAKE 1 TABLET BY MOUTH AT BEDTIME 09/02/15  Yes Collene Gobble, MD  Multiple Vitamins-Minerals (CENTRUM SILVER PO) Take 1 tablet by mouth daily.   Yes Historical Provider, MD  oxyCODONE (OXY IR/ROXICODONE) 5 MG immediate release tablet Take 5 mg by mouth once.   Yes Historical Provider, MD  Polyvinyl Alcohol-Povidone (REFRESH OP) Place 1 drop into both eyes daily as needed (for dry eyes).   Yes Historical Provider, MD  potassium chloride SA (KLOR-CON M20) 20 MEQ tablet Take 2 tablets (40 mEq total) by mouth 2 (two) times daily. take an extra 20 meq (1 tab) on Wed with Metolazone 09/18/15  Yes Larey Dresser, MD  pravastatin (PRAVACHOL) 20 MG tablet TAKE 1 TABLET BY MOUTH EVERY EVENING 03/24/15  Yes Larey Dresser, MD  predniSONE (DELTASONE) 10 MG tablet Days 1-3 take 5 pills, days 4-7 take 4 pills, days 8-10 take 3 pills, days 11-13 take 2 pills, days 14-16 take 1 pill, then resume 5 mg/day. Patient taking differently: Take 5 mg by mouth daily with breakfast. Days 1-3 take 5 pills, days 4-7 take 4 pills, days 8-10 take 3 pills, days 11-13 take 2 pills, days 14-16 take 1 pill, then resume 5 mg/day. 10/01/15  Yes Hoyt Koch, MD  PRESCRIPTION MEDICATION Oxygen.   Pt uses 6-8 liters of  oxygen daily.   Yes Historical Provider, MD  sertraline (ZOLOFT) 50 MG tablet TAKE 1 TABLET BY MOUTH AT BEDTIME (NEED APPT) 10/14/15  Yes Larey Dresser, MD  SPIRIVA HANDIHALER 18 MCG inhalation capsule INHALE 1 CAPSULE VIA HANDIHALER ONCE DAILY AT THE SAME TIME EVERY DAY 06/30/14  Yes Collene Gobble, MD  Tadalafil, PAH, (ADCIRCA) 20 MG TABS Take 2 tablets (40 mg total) by mouth daily. 08/07/15  Yes Larey Dresser, MD  Throat Lozenges [facility] MT) Use as directed 1 lozenge in the mouth or throat daily as needed (dry mouth). Reported on 10/01/2015   Yes Historical Provider, MD  torsemide (DEMADEX) 20 MG tablet Take 4 tablets (80 mg total) by mouth 2 (two) times daily. 10/15/15  Yes Larey Dresser, MD  traMADol (ULTRAM) 50 MG tablet Take 100 mg by mouth as needed for moderate pain.    Yes Historical Provider, MD   BP 95/83 mmHg  Pulse 105  Temp(Src) 98.4 F (36.9 C)  Resp 23  Ht _0  (1.626 m)  Wt 194 lb (87.998 kg)  BMI 33.28 kg/m2  SpO2 91% Physical Exam  Constitutional: She is oriented to person, place, and time. She appears well-developed and well-nourished. She appears ill. No distress.  HENT:  Head: Normocephalic and atraumatic.  Eyes: Conjunctivae and EOM are normal.  Neck: Normal range of motion.  Cardiovascular: Normal heart sounds and intact distal pulses.  An irregularly irregular rhythm present. Tachycardia present.  Exam reveals no gallop and no friction rub.   No murmur heard. Pulmonary/Chest: Effort normal and breath sounds normal. No respiratory distress. She has no wheezes. She has no rales.  Abdominal: Soft. She exhibits distension. There is tenderness (diffuse). There is no guarding.  Ventral hernia, unable to reduce (patient reports this is baseline)   Musculoskeletal: She exhibits no edema or tenderness.  Neurological: She is alert and oriented to person, place, and time.  Skin: Skin is warm and dry. No rash noted. She is not diaphoretic. No erythema.  Nursing note and  vitals reviewed.   ED Course  Procedures (including critical care time) Labs Review Labs Reviewed  COMPREHENSIVE METABOLIC PANEL - Abnormal; Notable for the following:    Potassium 5.3 (*)  Chloride 90 (*)    Glucose, Bld 180 (*)    BUN 76 (*)    Creatinine, Ser 3.80 (*)    Albumin 3.1 (*)    GFR calc non Af Amer 11 (*)    GFR calc Af Amer 13 (*)    Anion gap 18 (*)    All other components within normal limits  CBC WITH DIFFERENTIAL/PLATELET - Abnormal; Notable for the following:    RBC 5.18 (*)    MCH 25.5 (*)    RDW 20.9 (*)    Lymphs Abs 0.5 (*)    All other components within normal limits  URINALYSIS, ROUTINE W REFLEX MICROSCOPIC (NOT AT [facility]) - Abnormal; Notable for the following:    Color, Urine AMBER (*)    APPearance HAZY (*)    Bilirubin Urine LARGE (*)    Ketones, ur 15 (*)    Protein, ur 100 (*)    Leukocytes, UA TRACE (*)    All other components within normal limits  BRAIN NATRIURETIC PEPTIDE - Abnormal; Notable for the following:    B Natriuretic Peptide 492.2 (*)    All other components within normal limits  URINE MICROSCOPIC-ADD ON - Abnormal; Notable for the following:    Squamous Epithelial / LPF 6-30 (*)    Bacteria, UA FEW (*)    All other components within normal limits  I-STAT CG4 LACTIC ACID, ED - Abnormal; Notable for the following:    Lactic Acid, Venous 3.25 (*)    All other components within normal limits  I-STAT CG4 LACTIC ACID, ED - Abnormal; Notable for the following:    Lactic Acid, Venous 2.38 (*)    All other components within normal limits  CULTURE, BLOOD (ROUTINE X 2)  CULTURE, BLOOD (ROUTINE X 2)  URINE CULTURE  I-STAT TROPOININ, ED  I-STAT TROPOININ, ED    Imaging Review Ct Abdomen Pelvis Wo Contrast  10/28/2015  CLINICAL DATA:  Abdominal pain for 3 days EXAM: CT ABDOMEN AND PELVIS WITHOUT CONTRAST TECHNIQUE: Multidetector CT imaging of the abdomen and pelvis was performed following the standard protocol without IV contrast.  COMPARISON:  03/16/2015 FINDINGS: A large ventral hernia sac containing small bowel loops is present. This results an small bowel obstruction with dilated proximal small bowel loops. On images 26 and 31 of series 11, a small segment of small bowel is obstructed into adjacent locations, within close proximity resulting in a closed loop obstruction. This is also seen on sagittal images. See image 105 of series 9. There is no free intraperitoneal gas. There is stranding in the mesenteric suggesting venous congestion. There is is stranding within the fat contained in the hernia sac. Stranding in the overlying subcutaneous fatty tissue is again noted. Bibasilar atelectasis versus airspace disease. Confluent opacity at the posterior lung bases is worrisome for bibasilar aspiration pneumonia. There is a small amount of free fluid surrounding the liver. Liver is otherwise unremarkable. Multiple gallstones are noted. Spleen, pancreas, adrenal glands are within normal limits. Severe atrophy of the left kidney. Chronic changes of the right kidney are noted The stomach is distended and contains fluid Bladder is decompressed. Uterus is absent. Adnexa are unremarkable. Sigmoid diverticulosis. Abdominal aortic aneurysm is present. Maximal diameter is 3.5 cm. Vascular calcifications are noted No vertebral compression deformity. Multilevel degenerative disc disease. IMPRESSION: The large ventral hernia containing small bowel loops is causing a small bowel obstruction. There is an associated closed loop obstruction. Hernia sac and mesenteric fatty tissues suggesting venous congestion. Cholelithiasis Small  amount of free fluid about the liver Bibasilar consolidation.  Consider aspiration. Critical Value/emergent results were called by telephone at the time of interpretation on 10/23/2015 at 4:16 pm to Dr. Raynelle Highland, who verbally acknowledged these results. Electronically Signed   By: Marybelle Killings M.D.   On: 10/11/2015 16:16    Dg Chest 2 View  10/31/2015  CLINICAL DATA:  Hypotension and cough EXAM: CHEST  2 VIEW COMPARISON:  01/23/2015 FINDINGS: Chronic cardiopericardial enlargement. Diffuse interstitial coarsening compatible with mild edema. No effusions. Band of atelectasis seen in the lateral projection, likely along the right lower major fissure. Hyperinflation correlating with COPD history. IMPRESSION: 1. Mild pulmonary edema. 2. COPD. Electronically Signed   By: Monte Fantasia M.D.   On: 10/16/2015 13:03   I have personally reviewed and evaluated these images and lab results as part of my medical decision-making.   EKG Interpretation   Date/Time:  Tuesday October 27 2015 14:33:20 EST Ventricular Rate:  123 PR Interval:    QRS Duration: 94 QT Interval:  323 QTC Calculation: 462 R Axis:   -148 Text Interpretation:  Atrial fibrillation Anterior infarct, old No  significant change since last tracing Confirmed by [facility] MD, [place]  ([id number]) on 10/21/2015 5:47:19 PM       CRITICAL CARE:hypoxic respiratory failure, shock, SBO Performed by: Alvino Chapel   Total critical care time: 45 minutes  Critical care time was exclusive of separately billable procedures and treating other patients.  Critical care was necessary to treat or prevent imminent or life-threatening deterioration.  Critical care was time spent personally by me on the following activities: development of treatment plan with patient and/or surrogate as well as nursing, discussions with consultants, evaluation of patient's response to treatment, examination of patient, obtaining history from patient or surrogate, ordering and performing treatments and interventions, ordering and review of laboratory studies, ordering and review of radiographic studies, pulse oximetry and re-evaluation of patient's condition.   MDM   Final diagnoses:  Chronic hypoxemic respiratory failure (HCC)  Pulmonary hypertension (HCC)  Hypotension,  unspecified hypotension type  Ventral hernia with obstruction and without gangrene  Lactic acidosis   70 year old female with a history of atrial fibrillation on eliquis, COPD, pulmonary arterial hypertension, on 6-8 L of oxygen at home, restless leg syndrome, hypertension, OSA, hyperlipidemia, chronic umbilical hernia presents with concern of abdominal pain nausea and vomiting. Patient hypotensive to 80s over 60s on arrival to the emergency department, hypoxic on her home 6 L of oxygen.  Had long discussion with patient regarding goals of care, including concern that it would be difficult to wean her from the ventilator if she required this, however at this time wishes to be full code.  Patient with acute kidney injury with a creatinine of 3.8, lactic acidosis 3.25, unclear evidence of infection, no urinary symptoms, no increased cough from baseline, afebrile, however given hypotension with sepsis on differential, ordered empiric antibiotics and make medicine and Zosyn. Other etiologies of hypotension include dehydration. Ordered 2L of NS.  Differential for abdominal pain includes UTI, obstruction, mesenteric ischemia, AAA. Patient with irreducible umbilical hernia which she reports is unchanged from baseline.  Given AKI, ordered emergent noncontrast CT of abdomen.  CT abdomen shows large ventral hernia containing small bowel loops causing a small bowel obstruction with an associated closed loop obstruction. General surgery was consulted and Dr. Donne Hazel came to bedside to evaluate the patient. Patient is not an operative candidate at this time, and place an NG tube and will  continue to monitor.    Patient initially with improvement after receiving 2 L of normal saline, however blood pressures have again trended down. She is given a third liter of normal saline. Oxygen saturation tenuous at times, decreasing to 85%, however has now stabilized at 89-90%, patient's baseline.  Patient remains full code,  however will hold on intubation as much as possible given concern for difficulty weaning patient from the ventilator. Discussed with critical care who will come to bedside to evaluate patient. Signed out to oncoming provider.     Gareth Morgan, MD 10/14/2015 0156  Gareth Morgan, MD 10/12/2015 1304

## 2015-10-27 NOTE — ED Notes (Signed)
PT states last week had back pain then started having abdominal pain and severe intestinal pain.  No diarrhea.  Reported vomiting and nausea.  Pt is O2 dependent and now on 6L at triage

## 2015-10-27 NOTE — Consult Note (Signed)
ANTICOAGULATION CONSULT NOTE - Initial Consult  Pharmacy Consult for heparin Indication: atrial fibrillation  No Known Allergies  Patient Measurements: Height: _0  (162.6 cm) Weight: 194 lb (87.998 kg) IBW/kg (Calculated) : 54.7 Heparin Dosing Weight: 75.4 kg  Vital Signs: Temp: 98.4 F (36.9 C) (02/21 1157) BP: 88/65 mmHg (02/21 1830) Pulse Rate: 83 (02/21 1830)  Labs:  Recent Labs  10/20/2015 1241  HGB 13.2  HCT 43.0  PLT 175  CREATININE 3.80*    Estimated Creatinine Clearance: 15.2 mL/min (by C-G formula based on Cr of 3.8).  Assessment: 70 YO female with afib on Eliquis PTA. Last dose 2/21 AM. Baseline APTT 40 sec.   Goal of Therapy:  Heparin level 0.3-0.7 units/ml aPTT 60-102 seconds Monitor platelets by anticoagulation protocol: Yes   Plan:  No bolus Start Heparin at 1100 units/hr F/u 8 hour HL and APTT Monitor daily HL, APTT, CBC, s/sx bleeding  Joya San, PharmD Clinical Pharmacy Resident Pager # [phone] 10/24/2015 8:56 PM

## 2015-10-27 NOTE — Consult Note (Signed)
Pharmacy Antibiotic Note  Tracey Duncan is a 70 y.o. female admitted on 10/23/2015 with sepsis.  Pharmacy has been consulted for vancomycin dosing.  Pt reports abdominal pain and severe intestinal pain with N/V but not diarrhea. Scr elevated significantly from baseline (~2 >> 3.8). Pt is tachycardic, hypotensive, and tachypnic with elevated lactic acid.   Plan: Vanc 1500 mg IV x1, then 1000 mg IV q48h Zosyn 3.375g IV over 30 min x1 F/u Zosyn dosing - no pharmacy consult currently  Monitor renal function, cultures, LOT, clinical progression  Height: _0  (162.6 cm) Weight: 194 lb (87.998 kg) IBW/kg (Calculated) : 54.7  Temp (24hrs), Avg:98.4 F (36.9 C), Min:98.4 F (36.9 C), Max:98.4 F (36.9 C)   Recent Labs Lab 10/23/15 0945 10/11/2015 1241 10/13/2015 1245  WBC  --  5.6  --   CREATININE 2.01* 3.80*  --   LATICACIDVEN  --   --  3.25*    Estimated Creatinine Clearance: 15.2 mL/min (by C-G formula based on Cr of 3.8).    No Known Allergies  Antimicrobials this admission: Vanc 2/21 >>  Zosyn 2/21 >>   Dose adjustments this admission: n/a  Microbiology results: 2/21 BCx:  2/21 UCx:    Thank you for allowing pharmacy to be a part of this patient's care.  Roma Schanz 10/16/2015 2:14 PM

## 2015-10-27 NOTE — ED Notes (Signed)
Nurse to call back for report.

## 2015-10-28 ENCOUNTER — Inpatient Hospital Stay (HOSPITAL_COMMUNITY): Payer: Medicare Other

## 2015-10-28 DIAGNOSIS — A419 Sepsis, unspecified organism: Secondary | ICD-10-CM | POA: Insufficient documentation

## 2015-10-28 DIAGNOSIS — N189 Chronic kidney disease, unspecified: Secondary | ICD-10-CM

## 2015-10-28 DIAGNOSIS — J9601 Acute respiratory failure with hypoxia: Secondary | ICD-10-CM | POA: Insufficient documentation

## 2015-10-28 DIAGNOSIS — N179 Acute kidney failure, unspecified: Secondary | ICD-10-CM

## 2015-10-28 DIAGNOSIS — E875 Hyperkalemia: Secondary | ICD-10-CM | POA: Insufficient documentation

## 2015-10-28 LAB — CARBOXYHEMOGLOBIN
CARBOXYHEMOGLOBIN: 0.9 % (ref 0.5–1.5)
CARBOXYHEMOGLOBIN: 1.2 % (ref 0.5–1.5)
METHEMOGLOBIN: 1.1 % (ref 0.0–1.5)
Methemoglobin: 1 % (ref 0.0–1.5)
O2 SAT: 59.6 %
O2 Saturation: 52.7 %
TOTAL HEMOGLOBIN: 11.5 g/dL — AB (ref 12.0–16.0)
Total hemoglobin: 11.5 g/dL — ABNORMAL LOW (ref 12.0–16.0)

## 2015-10-28 LAB — LACTIC ACID, PLASMA
Lactic Acid, Venous: 2.2 mmol/L (ref 0.5–2.0)
Lactic Acid, Venous: 2.2 mmol/L (ref 0.5–2.0)
Lactic Acid, Venous: 2.2 mmol/L (ref 0.5–2.0)

## 2015-10-28 LAB — APTT
APTT: 34 s (ref 24–37)
APTT: 46 s — AB (ref 24–37)

## 2015-10-28 LAB — GLUCOSE, CAPILLARY
GLUCOSE-CAPILLARY: 143 mg/dL — AB (ref 65–99)
GLUCOSE-CAPILLARY: 138 mg/dL — AB (ref 65–99)
GLUCOSE-CAPILLARY: 126 mg/dL — AB (ref 65–99)
Glucose-Capillary: 136 mg/dL — ABNORMAL HIGH (ref 65–99)
Glucose-Capillary: 131 mg/dL — ABNORMAL HIGH (ref 65–99)
Glucose-Capillary: 113 mg/dL — ABNORMAL HIGH (ref 65–99)
Glucose-Capillary: 169 mg/dL — ABNORMAL HIGH (ref 65–99)

## 2015-10-28 LAB — BASIC METABOLIC PANEL
Anion gap: 11 (ref 5–15)
BUN: 81 mg/dL — ABNORMAL HIGH (ref 6–20)
CHLORIDE: 94 mmol/L — AB (ref 101–111)
CO2: 34 mmol/L — ABNORMAL HIGH (ref 22–32)
CREATININE: 3.86 mg/dL — AB (ref 0.44–1.00)
Calcium: 8.7 mg/dL — ABNORMAL LOW (ref 8.9–10.3)
GFR calc non Af Amer: 11 mL/min — ABNORMAL LOW (ref >60)
GFR, EST AFRICAN AMERICAN: 13 mL/min — AB (ref >60)
Glucose, Bld: 148 mg/dL — ABNORMAL HIGH (ref 65–99)
Potassium: 5.2 mmol/L — ABNORMAL HIGH (ref 3.5–5.1)
SODIUM: 139 mmol/L (ref 135–145)

## 2015-10-28 LAB — CBC
HCT: 36.1 % (ref 36.0–46.0)
Hemoglobin: 10.7 g/dL — ABNORMAL LOW (ref 12.0–15.0)
MCH: 24.9 pg — AB (ref 26.0–34.0)
MCHC: 29.6 g/dL — AB (ref 30.0–36.0)
MCV: 84 fL (ref 78.0–100.0)
Platelets: 170 10*3/uL (ref 150–400)
RBC: 4.3 MIL/uL (ref 3.87–5.11)
RDW: 20.6 % — AB (ref 11.5–15.5)
WBC: 4.8 10*3/uL (ref 4.0–10.5)

## 2015-10-28 LAB — PHOSPHORUS: PHOSPHORUS: 4.8 mg/dL — AB (ref 2.5–4.6)

## 2015-10-28 LAB — HEMOGLOBIN A1C
Hgb A1c MFr Bld: 6.4 % — ABNORMAL HIGH (ref 4.8–5.6)
Mean Plasma Glucose: 137 mg/dL

## 2015-10-28 LAB — PROCALCITONIN: Procalcitonin: 7.26 ng/mL

## 2015-10-28 LAB — HEPARIN LEVEL (UNFRACTIONATED): HEPARIN UNFRACTIONATED: 2.16 [IU]/mL — AB (ref 0.30–0.70)

## 2015-10-28 LAB — MAGNESIUM: MAGNESIUM: 2.5 mg/dL — AB (ref 1.7–2.4)

## 2015-10-28 MED ORDER — AMIODARONE IV BOLUS ONLY 150 MG/100ML
150.0000 mg | Freq: Once | INTRAVENOUS | Status: AC
  Administered 2015-10-28: 150 mg via INTRAVENOUS

## 2015-10-28 MED ORDER — AMIODARONE HCL IN DEXTROSE 360-4.14 MG/200ML-% IV SOLN
30.0000 mg/h | INTRAVENOUS | Status: DC
  Administered 2015-10-28 – 2015-10-29 (×2): 30 mg/h via INTRAVENOUS
  Filled 2015-10-28 (×6): qty 200

## 2015-10-28 MED ORDER — PHENYLEPHRINE HCL 10 MG/ML IJ SOLN
30.0000 ug/min | INTRAVENOUS | Status: DC
  Administered 2015-10-28: 180 ug/min via INTRAVENOUS
  Administered 2015-10-28: 80 ug/min via INTRAVENOUS
  Administered 2015-10-28: 180 ug/min via INTRAVENOUS
  Administered 2015-10-29: 160 ug/min via INTRAVENOUS
  Administered 2015-10-29: 180 ug/min via INTRAVENOUS
  Administered 2015-10-29: 60 ug/min via INTRAVENOUS
  Administered 2015-10-29: 180 ug/min via INTRAVENOUS
  Filled 2015-10-28 (×7): qty 4

## 2015-10-28 MED ORDER — HEPARIN (PORCINE) IN NACL 100-0.45 UNIT/ML-% IJ SOLN
1450.0000 [IU]/h | INTRAMUSCULAR | Status: DC
  Administered 2015-10-28: 1100 [IU]/h via INTRAVENOUS
  Administered 2015-10-29: 1450 [IU]/h via INTRAVENOUS
  Filled 2015-10-28 (×4): qty 250

## 2015-10-28 MED ORDER — NOREPINEPHRINE BITARTRATE 1 MG/ML IV SOLN
0.0000 ug/min | INTRAVENOUS | Status: DC
  Administered 2015-10-28: 5 ug/min via INTRAVENOUS
  Administered 2015-10-28: 15 ug/min via INTRAVENOUS
  Filled 2015-10-28 (×4): qty 4

## 2015-10-28 MED ORDER — PIPERACILLIN-TAZOBACTAM IN DEX 2-0.25 GM/50ML IV SOLN
2.2500 g | Freq: Three times a day (TID) | INTRAVENOUS | Status: DC
  Administered 2015-10-28 – 2015-10-29 (×5): 2.25 g via INTRAVENOUS
  Filled 2015-10-28 (×8): qty 50

## 2015-10-28 MED ORDER — SODIUM CHLORIDE 0.9 % IV BOLUS (SEPSIS)
500.0000 mL | Freq: Once | INTRAVENOUS | Status: AC
  Administered 2015-10-28: 500 mL via INTRAVENOUS

## 2015-10-28 MED ORDER — VANCOMYCIN HCL IN DEXTROSE 1-5 GM/200ML-% IV SOLN: 1000.0000 mg | INTRAVENOUS | Status: DC

## 2015-10-28 MED ORDER — SODIUM CHLORIDE 0.9 % IV BOLUS (SEPSIS)
1000.0000 mL | Freq: Once | INTRAVENOUS | Status: AC
  Administered 2015-10-28: 1000 mL via INTRAVENOUS

## 2015-10-28 MED ORDER — AMIODARONE HCL IN DEXTROSE 360-4.14 MG/200ML-% IV SOLN
60.0000 mg/h | INTRAVENOUS | Status: AC
  Administered 2015-10-28 (×2): 60 mg/h via INTRAVENOUS
  Filled 2015-10-28 (×2): qty 200

## 2015-10-28 NOTE — Progress Notes (Signed)
UR Completed. Devante Capano, RN, BSN.  [phone] 

## 2015-10-28 NOTE — Progress Notes (Signed)
PULMONARY / CRITICAL CARE MEDICINE   Name: Tracey Duncan MRN: [id number] DOB: 12/08/1945    ADMISSION DATE:  10/15/2015 CONSULTATION DATE:  10/21/2015  REFERRING MD:  EDP  CHIEF COMPLAINT:  Abd pain  HISTORY OF PRESENT ILLNESS:  Tracey Duncan is a 70 y.o. 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 Lamonte Sakai as well as heart failure team Dr. Aundra Dubin and Dr. Haroldine Laws).  She presented to [facility] ED 02/21 with abdominal pain, nausea, vomiting. In ED, CT of the abdomen revealed SBO.  She was seen in consultation by general surgery who recommended medical / conservative management.  In addition to her SBO, she had high O2 demands (NRB mask) as well as hypotension that was minimally responsive to fluids.  Lactate was initially elevated at 3.25 and decreased to 2.38 on repeat.  She has not had any fevers/chills/sweats and lab work reveals no leukocytosis.  After 3L IVF, SBP remained in 70's; therefore, PCCM called for admission.  SUBJECTIVE:  States she feels better; has passed gas. No pain concerns or nausea or vomiting  Limited code no CPR or intubation Poor UOP yesterday  VITAL SIGNS: BP 107/61 mmHg  Pulse 75  Temp(Src) 98.6 F (37 C) (Axillary)  Resp 22  Ht _0  (1.626 m)  Wt 202 lb 2.6 oz (91.7 kg)  BMI 34.68 kg/m2  SpO2 84%  HEMODYNAMICS:    VENTILATOR SETTINGS:    INTAKE / OUTPUT: I/O last 3 completed shifts: In: 4711.3 [I.V.:4661.3; IV Piggyback:50] Out: 350 [Urine:150; Emesis/NG output:200]   PHYSICAL EXAMINATION:  General: Adult female, in NAD. Fatigued Neuro: A&O x 3, non-focal.  HEENT: [place]/AT.EOMI. MM dry. NRB. Cardiovascular: IRIR, tachy, no M/R/G.  Lungs: Respirations even and unlabored. Coarse bilaterally with rhonchi. Abdomen: BS hypoactive.  Abd soft with no rebound or guarding. Umbilical hernia.  Musculoskeletal: No gross deformities, no edema.  Skin: Intact, warm, no rashes.  LABS:  BMET  Recent  Labs Lab 10/13/2015 1241 10/26/2015 1903 10/28/15 0510  NA 139 145 139  K 5.3* 6.0* 5.2*  CL 90* 93* 94*  CO2 31 33* 34*  BUN 76* 75* 81*  CREATININE 3.80* 3.71* 3.86*  GLUCOSE 180* 157* 148*    Electrolytes  Recent Labs Lab 10/21/2015 1241 10/19/2015 1903 10/28/15 0510  CALCIUM 10.3 9.4 8.7*  MG  --   --  2.5*  PHOS  --   --  4.8*    CBC  Recent Labs Lab 10/12/2015 1241 10/28/15 0510  WBC 5.6 4.8  HGB 13.2 10.7*  HCT 43.0 36.1  PLT 175 170    Coag's  Recent Labs Lab 11/02/2015 1903  APTT 40*    Sepsis Markers  Recent Labs Lab 10/25/2015 1245 10/16/2015 1518 10/13/2015 1903 10/28/15 0510  LATICACIDVEN 3.25* 2.38* 2.0  --   PROCALCITON  --   --  8.01 7.26    ABG No results for input(s): PHART, PCO2ART, PO2ART in the last 168 hours.  Liver Enzymes  Recent Labs Lab 10/20/2015 1241  AST 21  ALT 15  ALKPHOS 82  BILITOT 0.9  ALBUMIN 3.1*    Cardiac Enzymes  Recent Labs Lab 10/17/2015 1903  TROPONINI 0.05*    Glucose  Recent Labs Lab 11/03/2015 2004 10/21/2015 2202 10/28/15 0114 10/28/15 0204 10/28/15 0407  GLUCAP 144* 130* 136* 143* 131*    Imaging Ct Abdomen Pelvis Wo Contrast  10/18/2015  CLINICAL DATA:  Abdominal pain for 3 days EXAM: CT ABDOMEN  AND PELVIS WITHOUT CONTRAST TECHNIQUE: Multidetector CT imaging of the abdomen and pelvis was performed following the standard protocol without IV contrast. COMPARISON:  03/16/2015 FINDINGS: A large ventral hernia sac containing small bowel loops is present. This results an small bowel obstruction with dilated proximal small bowel loops. On images 26 and 31 of series 11, a small segment of small bowel is obstructed into adjacent locations, within close proximity resulting in a closed loop obstruction. This is also seen on sagittal images. See image 105 of series 9. There is no free intraperitoneal gas. There is stranding in the mesenteric suggesting venous congestion. There is is stranding within the fat  contained in the hernia sac. Stranding in the overlying subcutaneous fatty tissue is again noted. Bibasilar atelectasis versus airspace disease. Confluent opacity at the posterior lung bases is worrisome for bibasilar aspiration pneumonia. There is a small amount of free fluid surrounding the liver. Liver is otherwise unremarkable. Multiple gallstones are noted. Spleen, pancreas, adrenal glands are within normal limits. Severe atrophy of the left kidney. Chronic changes of the right kidney are noted The stomach is distended and contains fluid Bladder is decompressed. Uterus is absent. Adnexa are unremarkable. Sigmoid diverticulosis. Abdominal aortic aneurysm is present. Maximal diameter is 3.5 cm. Vascular calcifications are noted No vertebral compression deformity. Multilevel degenerative disc disease. IMPRESSION: The large ventral hernia containing small bowel loops is causing a small bowel obstruction. There is an associated closed loop obstruction. Hernia sac and mesenteric fatty tissues suggesting venous congestion. Cholelithiasis Small amount of free fluid about the liver Bibasilar consolidation.  Consider aspiration. Critical Value/emergent results were called by telephone at the time of interpretation on 10/15/2015 at 4:16 pm to Dr. Raynelle Highland, who verbally acknowledged these results. Electronically Signed   By: Marybelle Killings M.D.   On: 10/10/2015 16:16   Dg Chest 2 View  11/01/2015  CLINICAL DATA:  Hypotension and cough EXAM: CHEST  2 VIEW COMPARISON:  01/23/2015 FINDINGS: Chronic cardiopericardial enlargement. Diffuse interstitial coarsening compatible with mild edema. No effusions. Band of atelectasis seen in the lateral projection, likely along the right lower major fissure. Hyperinflation correlating with COPD history. IMPRESSION: 1. Mild pulmonary edema. 2. COPD. Electronically Signed   By: Monte Fantasia M.D.   On: 11/03/2015 13:03   Dg Chest Port 1 View  10/28/2015  CLINICAL DATA:  Central  line placement.  Initial encounter. EXAM: PORTABLE CHEST 1 VIEW COMPARISON:  Chest radiograph performed 10/19/2015 FINDINGS: A left IJ line is noted ending about the proximal SVC. The enteric tube is noted ending overlying the body of the stomach. The lungs are well-aerated. Vascular congestion is noted. Mild bibasilar airspace opacities may reflect mild interstitial edema or possibly pneumonia. There is no evidence of pleural effusion or pneumothorax. The cardiomediastinal silhouette is enlarged. No acute osseous abnormalities are seen. IMPRESSION: 1. Left IJ line noted ending about the proximal SVC. 2. Vascular congestion and cardiomegaly. Mild bibasilar airspace opacities may reflect mild interstitial edema or possibly pneumonia. Electronically Signed   By: Garald Balding M.D.   On: 10/28/2015 04:05   Dg Abd Portable 1v  10/28/2015  CLINICAL DATA:  Small-bowel obstruction. EXAM: PORTABLE ABDOMEN - 1 VIEW COMPARISON:  CT 10/21/2015 . FINDINGS: Soft tissue structures are unremarkable. Persistent dilated loop of bowel possibly small bowel in the left upper quadrant. Air is noted within the colon. Stool noted within the colon. Aortoiliac atherosclerotic vascular calcification. Surgical clips in the pelvis. Degenerative changes lumbar spine and both hips. IMPRESSION: 1. Dilated  loop of bowel possibly small bowel is again noted in the left upper quadrant. Air is noted in the colon. Stool is noted in the colon. Reference is made to prior CT of 10/18/2015 which demonstrates a large ventral hernia with bowel obstruction. 2. Aortoiliac atherosclerotic vascular disease. Electronically Signed   By: Marcello Moores  Register   On: 10/28/2015 07:12    STUDIES:  CT A / P 02/21 > large ventral hernia containing small bowel loop causing SBO with associated closed loop obstruction. Cholelithiasis with small amount of free fluid about the liver. CXR 02/21 > COPD, chronic changes.  CULTURES: Blood 02/21 > Urine 02/21  >  ANTIBIOTICS: Vanc 2/21>> 2/22 Zosyn 2/21>>  SIGNIFICANT EVENTS: 02/21 > admitted with SBO and hypotension (favored due to hypovolemia from decreased PO intake).  LINES/TUBES: CVL 02/22 > NGT R Nare 2/21 > PIV x3  ASSESSMENT / PLAN:  PULMONARY A: Acute on chronic hypoxemic respiratory failure - on 6-8L home O2. Hx O2 dependent COPD, OSA with probable component OHS, PAH (on opsumit and adcirca). Former smoker. P:   Continue supplemental O2 as needed to maintain SpO2 > 85%. NRB currently. Avoid BiPAP at this time  DuoNebs / Levalbuterol / Budesonide. Hold outpatient advair, spiriva, albuterol. Patient is a do not INTUBATE. Hold off on ABG collection; will not change therapy  CARDIOVASCULAR A:  Hypotension - favor due to hypovolemia from decreased PO intake.   Hx A.fib - failed DCCV in 2004 and has since been on xarelto. Hx HTN, HLD, cor pulmonale. P:  Caution with aggressive IVF resuscitation given her respiratory status. Neosynephrine as needed to maintain SBP > 90 or MAP > 65. CVP 7 Lactate improved  Amio for rate control. Currently in A.fib with RVR Restart Heparin gtt; will need to hold if going to surgery Hold outpatient bisoprolol, eliquis, macitentan metolazone, adcirca, torsemide.  RENAL A:   AoCKD. Hyperkalemia P:   NS @ 100. Trend BMP  GASTROINTESTINAL A:   Chronic ventral hernia - now with SBO. GI prophylaxis. Nutrition. P:   General surgery following, appreciate the assistance. - High risk for surgery Continue medical / conservative therapies for now. NPO. NG suctioning Monitor abd and bowels  HEMATOLOGIC A:   On chronic anticoagulation due to A.fib. VTE Prophylaxis. P:  Heparin gtt in lieu of outpatient eliquis SCD's / heparin gtt  Trend CBC  INFECTIOUS A:   Hypotension - favor due to hypovolemia from decreased PO intake.  Despite SIRS, doubt sepsis as no hx to support, no fever, no leukocytosis. P:   Continue Abx in setting of  high Pct Follow cultures as above.  ENDOCRINE A:   DM. Hypothyroidism.  P:   SSI. Continue outpatient synthroid, IV formulation. Hold outpatient metformin.  NEUROLOGIC A:   Hx depression, RLS. P:   Hold outpatient gabapentin, metaxalone, oxycodone, zoloft.  AUTOIMMUNE A: Hx RA - on leflunomide. P: [place].   Family updated: Family at bedside for rounds Interdisciplinary Family Meeting v Palliative Care Meeting:  Due by: 02/27.   Luiz Blare, DO 10/28/2015, 7:24 AM PGY-2, [place]

## 2015-10-28 NOTE — Progress Notes (Signed)
ANTICOAGULATION CONSULT NOTE - Initial Consult  Pharmacy Consult for heparin Indication: atrial fibrillation  No Known Allergies  Patient Measurements: Height: _0  (162.6 cm) Weight: 202 lb 2.6 oz (91.7 kg) IBW/kg (Calculated) : 54.7 Heparin Dosing Weight: 75.4 kg  Vital Signs: Temp: 99.5 F (37.5 C) (02/22 2023) Temp Source: Oral (02/22 2023) BP: 106/62 mmHg (02/22 2130) Pulse Rate: 96 (02/22 2130)  Labs:  Recent Labs  10/13/2015 1241 10/08/2015 1903 10/18/2015 1904 10/28/15 0510 10/28/15 1050 10/28/15 2048  HGB 13.2  --   --  10.7*  --   --   HCT 43.0  --   --  36.1  --   --   PLT 175  --   --  170  --   --   APTT  --  40*  --   --  34 46*  HEPARINUNFRC  --   --  >2.20*  --  2.16*  --   CREATININE 3.80* 3.71*  --  3.86*  --   --   TROPONINI  --  0.05*  --   --   --   --     Estimated Creatinine Clearance: 15.3 mL/min (by C-G formula based on Cr of 3.86).   Medical History: Past Medical History  Diagnosis Date  . Hypertension   . Atrial fibrillation ([place])     failed cardioversion, on anticoag  . OSA on CPAP   . Asthma   . Hyperlipidemia   . COPD (chronic obstructive pulmonary disease) ([facility])     with pulm HTN - on 6-8LPM O2  . Chronic diastolic CHF (congestive heart failure) ([place])   . Cor pulmonale ([place])   . Obesity hypoventilation syndrome ([place])   . Diabetes type 2, controlled ([place])   . Pulmonary arterial hypertension ([place])   . Secondary PAH   . Unspecified hypothyroidism   . RLS (restless legs syndrome) 08/20/2014    Noted 07/2014 sleep study    Assessment: 70 yo f admitted with a hernia and SBO.  Holding off of surgery right now.  Pt has afib and is on Eliquis at home. Last dose of Eliquis was 2/21 AM. Baseline aPTT is 34 and HL is 2.16 - not correlating.  Will dose heparin based on aPTT levels until HL is within range. Hgb 10.7, plts 170 - stable.  - PM aPTT 46 (6.5 hr level)  Goal of Therapy:  aPTT 60-102 seconds Heparin level 0.3-0.7  units/ml Monitor platelets by anticoagulation protocol: Yes   Plan:  - Increase heparin infusion to 1300 units/hr - Daily HL, aPTT, CBC   Chelsey Redondo S. Alford Highland, PharmD, [facility] Clinical Staff Pharmacist Pager [phone]   10/28/2015 9:58 PM

## 2015-10-28 NOTE — Progress Notes (Signed)
CRITICAL VALUE ALERT  Critical value received:  latic acid 2.2  Date of notification:  10/28/15  Time of notification:  1500  Critical value read back:Yes.    Nurse who received alert:  Allegra Grana, RN  MD notified (1st page):  CCM Resident  Time of first page:  1500  MD notified (2nd page):  Time of second page:  Responding MD:  CCM Resident  Time MD responded:  1500

## 2015-10-28 NOTE — Progress Notes (Signed)
Neb not given, NP at bedside preparation sterile fields

## 2015-10-28 NOTE — Progress Notes (Signed)
Subjective: Passed a little gas, abdominal pain better  Objective: Vital signs in last 24 hours: Temp:  [98 F (36.7 C)-101.2 F (38.4 C)] 101.2 F (38.4 C) (02/22 0744) Pulse Rate:  [39-156] 75 (02/22 0700) Resp:  [8-27] 22 (02/22 0700) BP: (71-132)/(44-112) 107/61 mmHg (02/22 0700) SpO2:  [78 %-98 %] 88 % (02/22 0744) Weight:  [87.998 kg (194 lb)-91.7 kg (202 lb 2.6 oz)] 91.7 kg (202 lb 2.6 oz) (02/22 0445)    Intake/Output from previous day: 02/21 0701 - 02/22 0700 In: 4711.3 [I.V.:4661.3; IV Piggyback:50] Out: 350 [Urine:150; Emesis/NG output:200] Intake/Output this shift:    General appearance: cooperative Resp: clear to auscultation bilaterally Cardio: irregularly irregular rhythm and 140s GI: hernia not reducible but not tender  Lab Results:   Recent Labs  10/11/2015 1241 10/28/15 0510  WBC 5.6 4.8  HGB 13.2 10.7*  HCT 43.0 36.1  PLT 175 170   BMET  Recent Labs  10/16/2015 1903 10/28/15 0510  NA 145 139  K 6.0* 5.2*  CL 93* 94*  CO2 33* 34*  GLUCOSE 157* 148*  BUN 75* 81*  CREATININE 3.71* 3.86*  CALCIUM 9.4 8.7*   PT/INR No results for input(s): LABPROT, INR in the last 72 hours. ABG No results for input(s): PHART, HCO3 in the last 72 hours.  Invalid input(s): PCO2, PO2  Studies/Results: Ct Abdomen Pelvis Wo Contrast  10/07/2015  CLINICAL DATA:  Abdominal pain for 3 days EXAM: CT ABDOMEN AND PELVIS WITHOUT CONTRAST TECHNIQUE: Multidetector CT imaging of the abdomen and pelvis was performed following the standard protocol without IV contrast. COMPARISON:  03/16/2015 FINDINGS: A large ventral hernia sac containing small bowel loops is present. This results an small bowel obstruction with dilated proximal small bowel loops. On images 26 and 31 of series 11, a small segment of small bowel is obstructed into adjacent locations, within close proximity resulting in a closed loop obstruction. This is also seen on sagittal images. See image 105 of series  9. There is no free intraperitoneal gas. There is stranding in the mesenteric suggesting venous congestion. There is is stranding within the fat contained in the hernia sac. Stranding in the overlying subcutaneous fatty tissue is again noted. Bibasilar atelectasis versus airspace disease. Confluent opacity at the posterior lung bases is worrisome for bibasilar aspiration pneumonia. There is a small amount of free fluid surrounding the liver. Liver is otherwise unremarkable. Multiple gallstones are noted. Spleen, pancreas, adrenal glands are within normal limits. Severe atrophy of the left kidney. Chronic changes of the right kidney are noted The stomach is distended and contains fluid Bladder is decompressed. Uterus is absent. Adnexa are unremarkable. Sigmoid diverticulosis. Abdominal aortic aneurysm is present. Maximal diameter is 3.5 cm. Vascular calcifications are noted No vertebral compression deformity. Multilevel degenerative disc disease. IMPRESSION: The large ventral hernia containing small bowel loops is causing a small bowel obstruction. There is an associated closed loop obstruction. Hernia sac and mesenteric fatty tissues suggesting venous congestion. Cholelithiasis Small amount of free fluid about the liver Bibasilar consolidation.  Consider aspiration. Critical Value/emergent results were called by telephone at the time of interpretation on 10/24/2015 at 4:16 pm to Dr. Joanie Coddington, who verbally acknowledged these results. Electronically Signed   By: Jolaine Click M.D.   On: 10/08/2015 16:16   Dg Chest 2 View  11/01/2015  CLINICAL DATA:  Hypotension and cough EXAM: CHEST  2 VIEW COMPARISON:  01/23/2015 FINDINGS: Chronic cardiopericardial enlargement. Diffuse interstitial coarsening compatible with mild edema. No effusions. Band of  atelectasis seen in the lateral projection, likely along the right lower major fissure. Hyperinflation correlating with COPD history. IMPRESSION: 1. Mild pulmonary edema.  2. COPD. Electronically Signed   By: Marnee Spring M.D.   On: Nov 22, 2015 13:03   Dg Chest Port 1 View  10/28/2015  CLINICAL DATA:  Central line placement.  Initial encounter. EXAM: PORTABLE CHEST 1 VIEW COMPARISON:  Chest radiograph performed 22-Nov-2015 FINDINGS: A left IJ line is noted ending about the proximal SVC. The enteric tube is noted ending overlying the body of the stomach. The lungs are well-aerated. Vascular congestion is noted. Mild bibasilar airspace opacities may reflect mild interstitial edema or possibly pneumonia. There is no evidence of pleural effusion or pneumothorax. The cardiomediastinal silhouette is enlarged. No acute osseous abnormalities are seen. IMPRESSION: 1. Left IJ line noted ending about the proximal SVC. 2. Vascular congestion and cardiomegaly. Mild bibasilar airspace opacities may reflect mild interstitial edema or possibly pneumonia. Electronically Signed   By: Roanna Raider M.D.   On: 10/28/2015 04:05   Dg Abd Portable 1v  10/28/2015  CLINICAL DATA:  Small-bowel obstruction. EXAM: PORTABLE ABDOMEN - 1 VIEW COMPARISON:  CT 11-22-2015 . FINDINGS: Soft tissue structures are unremarkable. Persistent dilated loop of bowel possibly small bowel in the left upper quadrant. Air is noted within the colon. Stool noted within the colon. Aortoiliac atherosclerotic vascular calcification. Surgical clips in the pelvis. Degenerative changes lumbar spine and both hips. IMPRESSION: 1. Dilated loop of bowel possibly small bowel is again noted in the left upper quadrant. Air is noted in the colon. Stool is noted in the colon. Reference is made to prior CT of 11-22-2015 which demonstrates a large ventral hernia with bowel obstruction. 2. Aortoiliac atherosclerotic vascular disease. Electronically Signed   By: Maisie Fus  Register   On: 10/28/2015 07:12    Anti-infectives: Anti-infectives    Start     Dose/Rate Route Frequency Ordered Stop   10/15/2015 1430  vancomycin (VANCOCIN) IVPB 1000  mg/200 mL premix  Status:  Discontinued     1,000 mg 200 mL/hr over 60 Minutes Intravenous Every 48 hours 2015/11/22 1435 11/22/15 1842   10/07/2015 1400  vancomycin (VANCOCIN) IVPB 1000 mg/200 mL premix     1,000 mg 200 mL/hr over 60 Minutes Intravenous Every 48 hours 10/28/15 0057     10/28/15 0200  piperacillin-tazobactam (ZOSYN) IVPB 2.25 g     2.25 g 100 mL/hr over 30 Minutes Intravenous 3 times per day 10/28/15 0057     11/22/15 1415  piperacillin-tazobactam (ZOSYN) IVPB 3.375 g     3.375 g 100 mL/hr over 30 Minutes Intravenous  Once Nov 22, 2015 1410 11/22/15 1453   11/22/15 1415  vancomycin (VANCOCIN) 1,500 mg in sodium chloride 0.9 % 500 mL IVPB     1,500 mg 250 mL/hr over 120 Minutes Intravenous  Once 11/22/15 1413 Nov 22, 2015 1626      Assessment/Plan: Ventral hernia with SBO - exam improved a bit. Repair would require prolonged ventilator support. Continue NGT and will follow. Eliquis should be worn off by tomorrow so we could consider surgery then but she is a terrible candidate. She may never come off the vent. I did speak with her, her daughter, and grandson. They have made her a DNR but may still consider surgery. We will see how she does.  Rapid a-fib/resp failure - per CCM  LOS: 1 day    Tracey Duncan E 10/28/2015

## 2015-10-28 NOTE — Consult Note (Signed)
Advanced Heart Failure Team Consult Note   Referring Physician: Creig Hines Primary Cardiologist:  Aundra Dubin  Reason for Consult: SBO, severe PAH   HPI:    70 y/o woman with morbid obesity, rheumatoid arthritis, chronic AF, diastolic HF, COPD (FEV1 8.46 in 2014), [place] admitted with SBO/sepsis.   As outpatient has been on Opsumit and Adcirca with marginal functional capacity. Last RHC with PAP 44/19. LV function is normal.   Has had respiratory failure and shock. Now on NRB. SBP 70s on 180 mcg of neo. Creatine up to 3.8. Amio started for chronic AF now with RVR in 130s Surgery has seen and patient felt to be very high risk for surgery.   Belly getting softer but not passing gas yet. NGT in place.   Review of Systems: [y] = yes, _0  = no   General: Weight gain _1 ; Weight loss _2 ; Anorexia Blue.Reese ]; Fatigue Blue.Reese ]; Fever _3 ; Chills _4 ; Weakness [ y]  Cardiac: Chest pain/pressure _5 ; Resting SOB [ y]; Exertional SOB Blue.Reese ]; Orthopnea _6 ; Pedal Edema _7 ; Palpitations _8 ; Syncope _9 ; Presyncope _10 ; Paroxysmal nocturnal dyspnea_11   Pulmonary: Cough _12 ; Wheezing_13 ; Hemoptysis_14 ; Sputum _15 ; Snoring _16   GI: Vomiting[y ]; Dysphagia_17 ; Melena_18 ; Hematochezia _19 ; Heartburn_20 ; Abdominal pain Blue.Reese ]; Constipation _21 ; Diarrhea _22 ; BRBPR _23   GU: Hematuria_24 ; Dysuria _25 ; Nocturia_26   Vascular: Pain in legs with walking _27 ; Pain in feet with lying flat _28 ; Non-healing sores _29 ; Stroke _30 ; TIA _31 ; Slurred speech _32 ;  Neuro: Headaches_33 ; Vertigo_34 ; Seizures_35 ; Paresthesias_36 ;Blurred vision _37 ; Diplopia _38 ; Vision changes _39   Ortho/Skin: Arthritis _40 ; Joint pain _41 ; Muscle pain _42 ; Joint swelling _43 ; Back Pain _44 ; Rash _45   Psych: Depression_46 ; Anxiety_47   Heme: Bleeding problems _48 ; Clotting disorders _49 ; Anemia [ y]  Endocrine: Diabetes [ y]; Thyroid dysfunction[ y]  Home Medications Prior to Admission medications   Medication Sig Start Date End Date Taking?  Authorizing Provider  acetaminophen (TYLENOL) 500 MG tablet Take 1,000 mg by mouth daily as needed (for arthritic pain).   Yes Historical Provider, MD  ADVAIR DISKUS 250-50 MCG/DOSE AEPB INHALE 1 PUFF INTO THE LUNGS EVERY 12 HOURS. 08/27/15  Yes Collene Gobble, MD  albuterol (PROAIR HFA) 108 (90 BASE) MCG/ACT inhaler Inhale 2 puffs into the lungs every 4 (four) hours as needed for wheezing or shortness of breath.    Yes Historical Provider, MD  Artificial Saliva (SALESE/XYLITOL MT) Use as directed 1 Dose in the mouth or throat as needed (dry mouth).   Yes Historical Provider, MD  bisoprolol (ZEBETA) 10 MG tablet TAKE 1 TABLET EVERY DAY 03/24/15  Yes Larey Dresser, MD  calcium-vitamin D (OSCAL WITH D) 500-200 MG-UNIT per tablet Take 1 tablet by mouth daily.   Yes Historical Provider, MD  cetirizine (ZYRTEC) 10 MG tablet Take 10 mg by mouth at bedtime.    Yes Historical Provider, MD  cholecalciferol (VITAMIN D) 1000 UNITS tablet Take 1,000 Units by mouth daily.   Yes Historical Provider, MD  colchicine 0.6 MG tablet Take 0.6 mg by mouth daily as needed (gouty flare).   Yes Historical Provider, MD  DENTAGEL 1.1 % GEL dental gel Place 1 application onto teeth at bedtime. Reported on 10/01/2015 12/13/14  Yes Historical  Provider, MD  ELIQUIS 5 MG TABS tablet TAKE 1 TABLET BY MOUTH 2 TIMES DAILY. 09/10/15  Yes Larey Dresser, MD  Febuxostat (ULORIC) 80 MG TABS Take 80 mg by mouth daily.   Yes Historical Provider, MD  fluticasone (FLONASE) 50 MCG/ACT nasal spray Place 1 spray into both nostrils daily.   Yes Historical Provider, MD  gabapentin (NEURONTIN) 300 MG capsule Take 300 mg by mouth 2 (two) times daily.   Yes Historical Provider, MD  hydrocortisone cream 1 % Apply 1 application topically 2 (two) times daily. 10/01/15  Yes Hoyt Koch, MD  ipratropium-albuterol (DUONEB) 0.5-2.5 (3) MG/3ML SOLN Take 3 mLs by nebulization every 4 (four) hours as needed (for wheezing or shortness of breath).    Yes  Historical Provider, MD  leflunomide (ARAVA) 20 MG tablet Take 20 mg by mouth daily.  02/09/15  Yes Historical Provider, MD  levothyroxine (SYNTHROID, LEVOTHROID) 50 MCG tablet Take 1 tablet (50 mcg total) by mouth daily before breakfast. 12/22/14  Yes Rowe Clack, MD  Macitentan (OPSUMIT) 10 MG TABS Take 10 mg by mouth daily. 06/09/15  Yes Larey Dresser, MD  Magnesium 400 MG CAPS Take 1 capsule by mouth daily.   Yes Historical Provider, MD  metaxalone (SKELAXIN) 800 MG tablet Take 800 mg by mouth once.   Yes Historical Provider, MD  metFORMIN (GLUCOPHAGE) 500 MG tablet Take 1 tablet (500 mg total) by mouth 2 (two) times daily with a meal. 12/22/14  Yes Rowe Clack, MD  metolazone (ZAROXOLYN) 2.5 MG tablet Take 2.5 mg of Metolazone on Tuesday and Friday 08/07/15  Yes Larey Dresser, MD  montelukast (SINGULAIR) 10 MG tablet TAKE 1 TABLET BY MOUTH AT BEDTIME 09/02/15  Yes Collene Gobble, MD  Multiple Vitamins-Minerals (CENTRUM SILVER PO) Take 1 tablet by mouth daily.   Yes Historical Provider, MD  oxyCODONE (OXY IR/ROXICODONE) 5 MG immediate release tablet Take 5 mg by mouth once.   Yes Historical Provider, MD  Polyvinyl Alcohol-Povidone (REFRESH OP) Place 1 drop into both eyes daily as needed (for dry eyes).   Yes Historical Provider, MD  potassium chloride SA (KLOR-CON M20) 20 MEQ tablet Take 2 tablets (40 mEq total) by mouth 2 (two) times daily. take an extra 20 meq (1 tab) on Wed with Metolazone 09/18/15  Yes Larey Dresser, MD  pravastatin (PRAVACHOL) 20 MG tablet TAKE 1 TABLET BY MOUTH EVERY EVENING 03/24/15  Yes Larey Dresser, MD  predniSONE (DELTASONE) 10 MG tablet Days 1-3 take 5 pills, days 4-7 take 4 pills, days 8-10 take 3 pills, days 11-13 take 2 pills, days 14-16 take 1 pill, then resume 5 mg/day. Patient taking differently: Take 5 mg by mouth daily with breakfast. Days 1-3 take 5 pills, days 4-7 take 4 pills, days 8-10 take 3 pills, days 11-13 take 2 pills, days 14-16 take 1  pill, then resume 5 mg/day. 10/01/15  Yes Hoyt Koch, MD  PRESCRIPTION MEDICATION Oxygen.   Pt uses 6-8 liters of oxygen daily.   Yes Historical Provider, MD  sertraline (ZOLOFT) 50 MG tablet TAKE 1 TABLET BY MOUTH AT BEDTIME (NEED APPT) 10/14/15  Yes Larey Dresser, MD  SPIRIVA HANDIHALER 18 MCG inhalation capsule INHALE 1 CAPSULE VIA HANDIHALER ONCE DAILY AT THE SAME TIME EVERY DAY 06/30/14  Yes Collene Gobble, MD  Tadalafil, PAH, (ADCIRCA) 20 MG TABS Take 2 tablets (40 mg total) by mouth daily. 08/07/15  Yes Larey Dresser, MD  Throat Lozenges (  SALESE MT) Use as directed 1 lozenge in the mouth or throat daily as needed (dry mouth). Reported on 10/01/2015   Yes Historical Provider, MD  torsemide (DEMADEX) 20 MG tablet Take 4 tablets (80 mg total) by mouth 2 (two) times daily. 10/15/15  Yes Larey Dresser, MD  traMADol (ULTRAM) 50 MG tablet Take 100 mg by mouth as needed for moderate pain.    Yes Historical Provider, MD    Past Medical History: Past Medical History  Diagnosis Date  . Hypertension   . Atrial fibrillation ([place])     failed cardioversion, on anticoag  . OSA on CPAP   . Asthma   . Hyperlipidemia   . COPD (chronic obstructive pulmonary disease) (HCC)     with pulm HTN - on 6-8LPM O2  . Chronic diastolic CHF (congestive heart failure) ([place])   . Cor pulmonale ([place])   . Obesity hypoventilation syndrome ([place])   . Diabetes type 2, controlled ([place])   . Pulmonary arterial hypertension ([place])   . Secondary PAH   . Unspecified hypothyroidism   . RLS (restless legs syndrome) 08/20/2014    Noted 07/2014 sleep study    Past Surgical History: Past Surgical History  Procedure Laterality Date  . Cardioversion    . Tonsillectomy  1955  . Abdominal hysterectomy  2004  . Right heart catheterization N/A 07/11/2013    Procedure: RIGHT HEART CATH;  Surgeon: Larey Dresser, MD;  Location: [facility] CATH LAB;  Service: Cardiovascular;  Laterality: N/A;  . Right heart catheterization N/A  02/06/2014    Procedure: RIGHT HEART CATH;  Surgeon: Larey Dresser, MD;  Location: [facility] CATH LAB;  Service: Cardiovascular;  Laterality: N/A;  . Cardiac catheterization N/A 01/26/2015    Procedure: Right Heart Cath;  Surgeon: Larey Dresser, MD;  Location: [place] CV LAB;  Service: Cardiovascular;  Laterality: N/A;    Family History: Family History  Problem Relation Age of Onset  . Emphysema Mother   . Allergies Mother   . Arthritis Mother   . Heart disease Father   . Colon polyps Father   . Heart disease Brother   . Tongue cancer Paternal Grandmother   . Ovarian cancer Cousin   . Breast cancer Cousin   . Diabetes Sister     Social History: Social History   Social History  . Marital Status: Widowed    Spouse Name: N/A  . Number of Children: 0  . Years of Education: N/A   Occupational History  . retired     Optometrist   Social History Main Topics  . Smoking status: Former Smoker -- 1.50 packs/day for 28 years    Types: Cigarettes    Quit date: 09/06/1991  . Smokeless tobacco: Never Used  . Alcohol Use: 0.0 oz/week    0 Standard drinks or equivalent per week     Comment: "very seldom" 09/26/12  . Drug Use: No  . Sexual Activity: Not Asked   Other Topics Concern  . None   Social History Narrative   Regular exercise-no   Caffeine Use-yes          Allergies:  No Known Allergies  Objective:    Vital Signs:   Temp:  [98 F (36.7 C)-101.2 F (38.4 C)] 99.7 F (37.6 C) (02/22 1212) Pulse Rate:  [39-156] 59 (02/22 1515) Resp:  [8-27] 21 (02/22 1515) BP: (64-114)/(43-85) 79/55 mmHg (02/22 1515) SpO2:  [73 %-97 %] 77 % (02/22 1515) Weight:  [91.7 kg (202 lb  2.6 oz)] 91.7 kg (202 lb 2.6 oz) (02/22 0445)   Filed Weights   10/23/2015 1157 10/10/2015 2006 10/28/15 0445  Weight: 87.998 kg (194 lb) 91.7 kg (202 lb 2.6 oz) 91.7 kg (202 lb 2.6 oz)    Physical Exam: General:  Ill appearing on NRB HEENT: normal except for NGT and NRB Neck: supple. JVP to jaw .  Carotids 2+ bilat; no bruits. No lymphadenopathy or thryomegaly appreciated. Cor: PMI nondisplaced. Tachy irregular distant Lungs: diffuse rhonchi  Abdomen: obese soft, nontender, nondistended. + reducible umbilical hernia.  Extremities: no cyanosis, clubbing, rash, tr edema Neuro: alert & orientedx3, cranial nerves grossly intact. moves all 4 extremities w/o difficulty. Affect pleasant  Telemetry: AF with RVR 130s  Labs: Basic Metabolic Panel:  Recent Labs Lab 10/23/15 0945 10/24/2015 1241 10/14/2015 1903 10/28/15 0510  NA 141 139 145 139  K 4.8 5.3* 6.0* 5.2*  CL 96* 90* 93* 94*  CO2 32 31 33* 34*  GLUCOSE 157* 180* 157* 148*  BUN 47* 76* 75* 81*  CREATININE 2.01* 3.80* 3.71* 3.86*  CALCIUM 9.4 10.3 9.4 8.7*  MG  --   --   --  2.5*  PHOS  --   --   --  4.8*    Liver Function Tests:  Recent Labs Lab 10/22/2015 1241  AST 21  ALT 15  ALKPHOS 82  BILITOT 0.9  PROT 6.6  ALBUMIN 3.1*   No results for input(s): LIPASE, AMYLASE in the last 168 hours. No results for input(s): AMMONIA in the last 168 hours.  CBC:  Recent Labs Lab 10/19/2015 1241 10/28/15 0510  WBC 5.6 4.8  NEUTROABS 4.3  --   HGB 13.2 10.7*  HCT 43.0 36.1  MCV 83.0 84.0  PLT 175 170    Cardiac Enzymes:  Recent Labs Lab 10/26/2015 1903  TROPONINI 0.05*    BNP: BNP (last 3 results)  Recent Labs  01/22/15 1518 06/08/15 1045 10/08/2015 1504  BNP 210.9* 255.8* 492.2*    ProBNP (last 3 results) No results for input(s): PROBNP in the last 8760 hours.   CBG:  Recent Labs Lab 10/28/15 0114 10/28/15 0204 10/28/15 0407 10/28/15 0751 10/28/15 1211  GLUCAP 136* 143* 131* 113* 138*    Coagulation Studies: No results for input(s): LABPROT, INR in the last 72 hours.  Other results: EKG: AF with RVR  Imaging: Ct Abdomen Pelvis Wo Contrast  10/23/2015  CLINICAL DATA:  Abdominal pain for 3 days EXAM: CT ABDOMEN AND PELVIS WITHOUT CONTRAST TECHNIQUE: Multidetector CT imaging of the  abdomen and pelvis was performed following the standard protocol without IV contrast. COMPARISON:  03/16/2015 FINDINGS: A large ventral hernia sac containing small bowel loops is present. This results an small bowel obstruction with dilated proximal small bowel loops. On images 26 and 31 of series 11, a small segment of small bowel is obstructed into adjacent locations, within close proximity resulting in a closed loop obstruction. This is also seen on sagittal images. See image 105 of series 9. There is no free intraperitoneal gas. There is stranding in the mesenteric suggesting venous congestion. There is is stranding within the fat contained in the hernia sac. Stranding in the overlying subcutaneous fatty tissue is again noted. Bibasilar atelectasis versus airspace disease. Confluent opacity at the posterior lung bases is worrisome for bibasilar aspiration pneumonia. There is a small amount of free fluid surrounding the liver. Liver is otherwise unremarkable. Multiple gallstones are noted. Spleen, pancreas, adrenal glands are within normal limits. Severe atrophy of  the left kidney. Chronic changes of the right kidney are noted The stomach is distended and contains fluid Bladder is decompressed. Uterus is absent. Adnexa are unremarkable. Sigmoid diverticulosis. Abdominal aortic aneurysm is present. Maximal diameter is 3.5 cm. Vascular calcifications are noted No vertebral compression deformity. Multilevel degenerative disc disease. IMPRESSION: The large ventral hernia containing small bowel loops is causing a small bowel obstruction. There is an associated closed loop obstruction. Hernia sac and mesenteric fatty tissues suggesting venous congestion. Cholelithiasis Small amount of free fluid about the liver Bibasilar consolidation.  Consider aspiration. Critical Value/emergent results were called by telephone at the time of interpretation on 10/31/2015 at 4:16 pm to Dr. Raynelle Highland, who verbally acknowledged  these results. Electronically Signed   By: Marybelle Killings M.D.   On: 10/26/2015 16:16   Dg Chest 2 View  10/13/2015  CLINICAL DATA:  Hypotension and cough EXAM: CHEST  2 VIEW COMPARISON:  01/23/2015 FINDINGS: Chronic cardiopericardial enlargement. Diffuse interstitial coarsening compatible with mild edema. No effusions. Band of atelectasis seen in the lateral projection, likely along the right lower major fissure. Hyperinflation correlating with COPD history. IMPRESSION: 1. Mild pulmonary edema. 2. COPD. Electronically Signed   By: Monte Fantasia M.D.   On: 10/17/2015 13:03   Dg Chest Port 1 View  10/28/2015  CLINICAL DATA:  Central line placement.  Initial encounter. EXAM: PORTABLE CHEST 1 VIEW COMPARISON:  Chest radiograph performed 10/12/2015 FINDINGS: A left IJ line is noted ending about the proximal SVC. The enteric tube is noted ending overlying the body of the stomach. The lungs are well-aerated. Vascular congestion is noted. Mild bibasilar airspace opacities may reflect mild interstitial edema or possibly pneumonia. There is no evidence of pleural effusion or pneumothorax. The cardiomediastinal silhouette is enlarged. No acute osseous abnormalities are seen. IMPRESSION: 1. Left IJ line noted ending about the proximal SVC. 2. Vascular congestion and cardiomegaly. Mild bibasilar airspace opacities may reflect mild interstitial edema or possibly pneumonia. Electronically Signed   By: Garald Balding M.D.   On: 10/28/2015 04:05   Dg Abd Portable 1v  10/28/2015  CLINICAL DATA:  Small-bowel obstruction. EXAM: PORTABLE ABDOMEN - 1 VIEW COMPARISON:  CT 10/23/2015 . FINDINGS: Soft tissue structures are unremarkable. Persistent dilated loop of bowel possibly small bowel in the left upper quadrant. Air is noted within the colon. Stool noted within the colon. Aortoiliac atherosclerotic vascular calcification. Surgical clips in the pelvis. Degenerative changes lumbar spine and both hips. IMPRESSION: 1. Dilated  loop of bowel possibly small bowel is again noted in the left upper quadrant. Air is noted in the colon. Stool is noted in the colon. Reference is made to prior CT of 10/14/2015 which demonstrates a large ventral hernia with bowel obstruction. 2. Aortoiliac atherosclerotic vascular disease. Electronically Signed   By: Marcello Moores  Register   On: 10/28/2015 07:12         Assessment:   1. SBO in setting of umbilical hernia 2. Shock - likely due to sepsis and PAH/cor pulmonale 3. Acute on chronic respiratory failure 4. Acute on chronic renal failure, stage IV likely due to ATN 5. Chronic AF now with RVR 6. Rheumatoid arthritis 7. RA   Plan/Discussion:    She is critically ill with multi-system organ failure . Given her comorbidities her risk with surgery would be incredibly high and I would consider almost prohibitive. Fortunately SBO now seems to be resolving.   For now agree with ongoing supportive care and see how she progresses. Rebolus amio for rapid  AF. Will start levophed for RV and BP support. Wean neo slowly. As BP improves may be able to add milrinone milrinone to bring down PA pressures while she is off Opsumit and Adcirca. Will check co-ox to help guide pressor titration. Check echo.   Even if her SBO resolves her morbidity and mortality remain high due to renal and respiratory failure.   We will follow.   The patient is critically ill with multiple organ systems failure and requires high complexity decision making for assessment and support, frequent evaluation and titration of therapies, application of advanced monitoring technologies and extensive interpretation of multiple databases.   Critical Care Time devoted to patient care services described in this note is 45 Minutes.     Length of Stay: 1  Bensimhon, Daniel MD 10/28/2015, 3:27 PM  Advanced Heart Failure Team Pager [phone] (M-F; [facility])  Please contact [facility] for night-coverage after hours (4p -7a )  and weekends on [email or web address]

## 2015-10-28 NOTE — Progress Notes (Signed)
[facility] Progress Note Patient Name: Tracey Duncan DOB: 08/19/1946 MRN: [id number]   Date of Service  10/28/2015  HPI/Events of Note  Patient is a limited code of DNI.  Now with drop in sats to the 70s.  Nurse at bedside discussed BiPAP as an option for treating the hypoxia.  Patient is alert.  Family is agreeable to patient trying BiPAP.  eICU Interventions  Plan: Initiate BiPAP Further use of BiPAP to be discussed on rounds.     Intervention Category Intermediate Interventions: Respiratory distress - evaluation and management  DETERDING,ELIZABETH 10/28/2015, 6:10 AM

## 2015-10-28 NOTE — Progress Notes (Signed)
ANTICOAGULATION CONSULT NOTE - Initial Consult  Pharmacy Consult for heparin Indication: atrial fibrillation  No Known Allergies  Patient Measurements: Height: _0  (162.6 cm) Weight: 202 lb 2.6 oz (91.7 kg) IBW/kg (Calculated) : 54.7 Heparin Dosing Weight: 75.4 kg  Vital Signs: Temp: 99.7 F (37.6 C) (02/22 0900) Temp Source: Axillary (02/22 0900) BP: 107/51 mmHg (02/22 0900) Pulse Rate: 43 (02/22 0800)  Labs:  Recent Labs  10/25/2015 1241 11/01/2015 1903 10/22/2015 1904 10/28/15 0510 10/28/15 1050  HGB 13.2  --   --  10.7*  --   HCT 43.0  --   --  36.1  --   PLT 175  --   --  170  --   APTT  --  40*  --   --  34  HEPARINUNFRC  --   --  >2.20*  --   --   CREATININE 3.80* 3.71*  --  3.86*  --   TROPONINI  --  0.05*  --   --   --     Estimated Creatinine Clearance: 15.3 mL/min (by C-G formula based on Cr of 3.86).   Medical History: Past Medical History  Diagnosis Date  . Hypertension   . Atrial fibrillation ([place])     failed cardioversion, on anticoag  . OSA on CPAP   . Asthma   . Hyperlipidemia   . COPD (chronic obstructive pulmonary disease) (HCC)     with pulm HTN - on 6-8LPM O2  . Chronic diastolic CHF (congestive heart failure) ([place])   . Cor pulmonale ([place])   . Obesity hypoventilation syndrome ([place])   . Diabetes type 2, controlled ([place])   . Pulmonary arterial hypertension ([place])   . Secondary PAH   . Unspecified hypothyroidism   . RLS (restless legs syndrome) 08/20/2014    Noted 07/2014 sleep study    Assessment: 70 yo f admitted with a hernia and SBO.  Holding off of surgery right now.  Pt has afib and is on Eliquis at home. Last dose of Eliquis was 2/21 AM. Baseline aPTT is 34 and HL is 2.16 - not correlating.  Will dose heparin based on aPTT levels until HL is within range. Hgb 10.7, plts 170 - stable.  Goal of Therapy:  aPTT 60-102 seconds Heparin level 0.3-0.7 units/ml Monitor platelets by anticoagulation protocol: Yes   Plan:  - No bolus -  Start heparin infusion at 1100 units/hr - F/u 8-hr aPTT @ 2130 - Daily HL, aPTT, CBC - Monitor s/s of bleeding, plans for surgery  Cassie L. Nicole Kindred, PharmD PGY2 Infectious Diseases Pharmacy Resident Pager: [phone] 10/28/2015 1:02 PM

## 2015-10-28 NOTE — Procedures (Signed)
Central Venous Catheter Insertion Procedure Note Tracey Duncan [id number] [birth year]  Procedure: Insertion of Central Venous Catheter Indications: Assessment of intravascular volume  Procedure Details Consent: Risks of procedure as well as the alternatives and risks of each were explained to the (patient/caregiver).  Consent for procedure obtained. Time Out: Verified patient identification, verified procedure, site/side was marked, verified correct patient position, special equipment/implants available, medications/allergies/relevent history reviewed, required imaging and test results available.  Performed  Maximum sterile technique was used including antiseptics, cap, gloves, gown, hand hygiene, mask and sheet. Skin prep: Chlorhexidine; local anesthetic administered A antimicrobial bonded/coated triple lumen catheter was placed in the left internal jugular vein using the Seldinger technique. Ultrasound guidance used.Yes.   Catheter placed to 19 cm. Blood aspirated via all 3 ports and then flushed x 3. Line sutured x 2 and dressing applied.  Evaluation Blood flow good Complications: No apparent complications Patient did tolerate procedure well. Chest X-ray ordered to verify placement.  CXR: pending.  Georgann Housekeeper, AGACNP-BC [facility] Pulmonology/Critical Care Pager [phone] or [phone]  10/28/2015 3:44 AM

## 2015-10-28 NOTE — Progress Notes (Signed)
CRITICAL VALUE ALERT  Critical value received:  Lactic acid 2.2  Date of notification:  10/28/15  Time of notification:  1915 and 2310  Critical value read back: Yes  Nurse who received alert:  Deboraha Sprang, RN  MD notified (1st page):  [place], Dr Vaughan Browner informed today's lactic acid results 2.2  Time of first page:  2323

## 2015-10-28 NOTE — Consult Note (Signed)
Pharmacy Antibiotic Note  Tracey Duncan is a 70 y.o. female admitted on 10/14/2015 with sepsis.  Pharmacy has been consulted for vancomycin dosing (which was done earlier) and Zosyn dosing. Pt with AKI, SCr up to 3.7.   Plan: Vanc 1000 mg IV q48h Zosyn 2.25gm IV q8h Monitor renal function, cultures, LOT, clinical progression Vanc trough prn  Height: _0  (162.6 cm) Weight: 202 lb 2.6 oz (91.7 kg) IBW/kg (Calculated) : 54.7  Temp (24hrs), Avg:98.3 F (36.8 C), Min:98 F (36.7 C), Max:98.4 F (36.9 C)   Recent Labs Lab 10/23/15 0945 10/18/2015 1241 10/12/2015 1245 10/08/2015 1518 10/21/2015 1903  WBC  --  5.6  --   --   --   CREATININE 2.01* 3.80*  --   --  3.71*  LATICACIDVEN  --   --  3.25* 2.38* 2.0    Estimated Creatinine Clearance: 15.9 mL/min (by C-G formula based on Cr of 3.71).    No Known Allergies  Antimicrobials this admission: Vanc 2/21 >>  Zosyn 2/21 >>   Dose adjustments this admission: n/a  Microbiology results: 2/21 BCx:  2/21 UCx:   2/21 MRSA PCR: neg  Thank you for allowing pharmacy to be a part of this patient's care.  Sherlon Handing, PharmD, BCPS Clinical pharmacist, pager [phone] 10/28/2015 12:41 AM

## 2015-10-28 NOTE — Progress Notes (Signed)
PCCM INTERVAL PROGRESS NOTE  Patient's family had some questions about BiPAP. Patients O2 sats tonight ranging from 80% to 90% on non-rebreather with recent dip into mid 70s. BiPAP was considered. I discussed this with family and the patient. She is in no distress and has no SOB. She tells me that her baseline, at rest, SpO2 is about 90% at home. With any activity she desats to about 75%. Sats here and at home are essentially the same, albeit, on higher FiO2 here. Patient maintains she would not want to be on vent (would still consider though if needed for surgery, knowing she may not come off easily if at all). Patient and family prefer to defer BiPAP at this time and will potentially re-evaluate goals of care once plan of care becomes more clear (i.e. Surgery).   Georgann Housekeeper, AGACNP-BC [facility] Pulmonology/Critical Care Pager [phone] or [phone]  10/28/2015 6:27 AM

## 2015-10-28 NOTE — Care Management Note (Signed)
Case Management Note  Patient Details  Name: Tracey Duncan MRN: [id number] Date of Birth: Jun 15, 1946  Subjective/Objective:     Pt admitted with SBO -  Surgery consulted and will pursue medical management               Action/Plan:  Pt is independent from home alone - sisters are in [place], [place] [phone]) was at bedside.  Pt is now on NRB Pt receives home O2 from [facility], family requested CM contact agency and request repair on concentrator and refill on tanks.  CM contacted [facility] DME liaison and he has agreed to handle request.  Pt verified that she does have portable tanks available for transport home.  CM will continue to monitor for disposition needs   Expected Discharge Date:                  Expected Discharge Plan:  Home/Self Care  In-House Referral:     Discharge planning Services  CM Consult  Post Acute Care Choice:    Choice offered to:     DME Arranged:    DME Agency:     HH Arranged:    HH Agency:     Status of Service:  In process, will continue to follow  Medicare Important Message Given:    Date Medicare IM Given:    Medicare IM give by:    Date Additional Medicare IM Given:    Additional Medicare Important Message give by:     If discussed at [place] of Stay Meetings, dates discussed:    Additional Comments:  Maryclare Labrador, RN 10/28/2015, 11:02 AM

## 2015-10-29 ENCOUNTER — Inpatient Hospital Stay (HOSPITAL_COMMUNITY): Payer: Medicare Other

## 2015-10-29 DIAGNOSIS — I2609 Other pulmonary embolism with acute cor pulmonale: Secondary | ICD-10-CM

## 2015-10-29 LAB — APTT
aPTT: 53 seconds — ABNORMAL HIGH (ref 24–37)
aPTT: 72 seconds — ABNORMAL HIGH (ref 24–37)

## 2015-10-29 LAB — PROTIME-INR
INR: 1.68 — ABNORMAL HIGH (ref 0.00–1.49)
PROTHROMBIN TIME: 19.8 s — AB (ref 11.6–15.2)

## 2015-10-29 LAB — BASIC METABOLIC PANEL
Anion gap: 15 (ref 5–15)
BUN: 85 mg/dL — ABNORMAL HIGH (ref 6–20)
CHLORIDE: 94 mmol/L — AB (ref 101–111)
CO2: 27 mmol/L (ref 22–32)
CREATININE: 3.75 mg/dL — AB (ref 0.44–1.00)
Calcium: 7.7 mg/dL — ABNORMAL LOW (ref 8.9–10.3)
GFR calc non Af Amer: 11 mL/min — ABNORMAL LOW (ref >60)
GFR, EST AFRICAN AMERICAN: 13 mL/min — AB (ref >60)
Glucose, Bld: 166 mg/dL — ABNORMAL HIGH (ref 65–99)
Potassium: 4.6 mmol/L (ref 3.5–5.1)
SODIUM: 136 mmol/L (ref 135–145)

## 2015-10-29 LAB — URINE CULTURE

## 2015-10-29 LAB — CBC
HCT: 35 % — ABNORMAL LOW (ref 36.0–46.0)
Hemoglobin: 10.5 g/dL — ABNORMAL LOW (ref 12.0–15.0)
MCH: 24.9 pg — ABNORMAL LOW (ref 26.0–34.0)
MCHC: 30 g/dL (ref 30.0–36.0)
MCV: 83.1 fL (ref 78.0–100.0)
PLATELETS: 162 10*3/uL (ref 150–400)
RBC: 4.21 MIL/uL (ref 3.87–5.11)
RDW: 20.2 % — ABNORMAL HIGH (ref 11.5–15.5)
WBC: 8.4 10*3/uL (ref 4.0–10.5)

## 2015-10-29 LAB — HEPARIN LEVEL (UNFRACTIONATED): Heparin Unfractionated: 1.64 IU/mL — ABNORMAL HIGH (ref 0.30–0.70)

## 2015-10-29 LAB — GLUCOSE, CAPILLARY
GLUCOSE-CAPILLARY: 121 mg/dL — AB (ref 65–99)
GLUCOSE-CAPILLARY: 168 mg/dL — AB (ref 65–99)
GLUCOSE-CAPILLARY: 175 mg/dL — AB (ref 65–99)
Glucose-Capillary: 140 mg/dL — ABNORMAL HIGH (ref 65–99)
Glucose-Capillary: 130 mg/dL — ABNORMAL HIGH (ref 65–99)

## 2015-10-29 LAB — PROCALCITONIN: PROCALCITONIN: 37.62 ng/mL

## 2015-10-29 LAB — CARBOXYHEMOGLOBIN
Carboxyhemoglobin: 0.9 % (ref 0.5–1.5)
Methemoglobin: 1.1 % (ref 0.0–1.5)
O2 SAT: 57.4 %
Total hemoglobin: 10.8 g/dL — ABNORMAL LOW (ref 12.0–16.0)

## 2015-10-29 MED ORDER — CHLORHEXIDINE GLUCONATE 0.12 % MT SOLN
15.0000 mL | Freq: Two times a day (BID) | OROMUCOSAL | Status: DC
  Administered 2015-10-29: 15 mL via OROMUCOSAL

## 2015-10-29 MED ORDER — CETYLPYRIDINIUM CHLORIDE 0.05 % MT LIQD: 7.0000 mL | Freq: Two times a day (BID) | OROMUCOSAL | Status: DC

## 2015-10-29 MED ORDER — FUROSEMIDE 10 MG/ML IJ SOLN
80.0000 mg | Freq: Once | INTRAMUSCULAR | Status: AC
  Administered 2015-10-29: 80 mg via INTRAVENOUS
  Filled 2015-10-29: qty 8

## 2015-10-29 MED ORDER — DEXTROSE 5 % IV SOLN
10.0000 mg/h | INTRAVENOUS | Status: DC
  Administered 2015-10-29: 10 mg/h via INTRAVENOUS
  Filled 2015-10-29: qty 10

## 2015-10-29 MED ORDER — NOREPINEPHRINE BITARTRATE 1 MG/ML IV SOLN
0.0000 ug/min | INTRAVENOUS | Status: DC
  Administered 2015-10-29: 10 ug/min via INTRAVENOUS
  Filled 2015-10-29 (×2): qty 16

## 2015-10-29 MED ORDER — MORPHINE SULFATE (PF) 2 MG/ML IV SOLN
0.5000 mg | INTRAVENOUS | Status: DC | PRN
  Administered 2015-10-29 (×2): 1 mg via INTRAVENOUS
  Filled 2015-10-29 (×2): qty 1

## 2015-10-29 MED ORDER — MORPHINE BOLUS VIA INFUSION
5.0000 mg | INTRAVENOUS | Status: DC | PRN
  Filled 2015-10-29: qty 20

## 2015-10-29 NOTE — Progress Notes (Addendum)
Pt heart monitor asystole. Breath and Heart sounds auscultated and not noted. Pt pronounced by myself Deboraha Sprang, RN and Anselm Pancoast, RN. Family at bedside Dr. Melvyn Novas Notified.

## 2015-10-29 NOTE — Progress Notes (Signed)
  Echocardiogram 2D Echocardiogram has been performed.  Tracey Duncan M 10/18/2015, 1:12 PM

## 2015-10-29 NOTE — Progress Notes (Signed)
ANTICOAGULATION CONSULT NOTE  Pharmacy Consult for heparin Indication: atrial fibrillation  No Known Allergies  Patient Measurements: Height: _0  (162.6 cm) Weight: 214 lb 11.7 oz (97.4 kg) IBW/kg (Calculated) : 54.7 Heparin Dosing Weight: 75.4 kg  Vital Signs: Temp: 98.4 F (36.9 C) (02/23 0326) Temp Source: Axillary (02/23 0326) BP: 95/49 mmHg (02/23 0600) Pulse Rate: 123 (02/23 0545)  Labs:  Recent Labs  11/02/2015 1241  10/28/2015 1903 11/03/2015 1904 10/28/15 0510 10/28/15 1050 10/28/15 2048 10/28/2015 0500  HGB 13.2  --   --   --  10.7*  --   --  10.5*  HCT 43.0  --   --   --  36.1  --   --  35.0*  PLT 175  --   --   --  170  --   --  162  APTT  --   < > 40*  --   --  34 46* 53*  HEPARINUNFRC  --   --   --  >2.20*  --  2.16*  --   --   CREATININE 3.80*  --  3.71*  --  3.86*  --   --   --   TROPONINI  --   --  0.05*  --   --   --   --   --   < > = values in this interval not displayed.  Estimated Creatinine Clearance: 15.8 mL/min (by C-G formula based on Cr of 3.86).   Medical History: Past Medical History  Diagnosis Date  . Hypertension   . Atrial fibrillation ([place])     failed cardioversion, on anticoag  . OSA on CPAP   . Asthma   . Hyperlipidemia   . COPD (chronic obstructive pulmonary disease) (H[facility])     with pulm HTN - on 6-8LPM O2  . Chronic diastolic CHF (congestive heart failure) ([place])   . Cor pulmonale ([place])   . Obesity hypoventilation syndrome ([place])   . Diabetes type 2, controlled ([place])   . Pulmonary arterial hypertension ([place])   . Secondary PAH   . Unspecified hypothyroidism   . RLS (restless legs syndrome) 08/20/2014    Noted 07/2014 sleep study    Assessment: 70 yo f admitted with a hernia and SBO.  Holding off of surgery right now.  Pt has afib and is on Eliquis at home. Last dose of Eliquis was 2/21 AM. Baseline aPTT is 34 and HL is 2.16 - not correlating.  Will dose heparin based on aPTT levels until HL is within range. Hgb 10.7, plts 170  - stable.  - PM aPTT 46 (6.5 hr level) - AM aPTT 53. Nurse reports no issues with infusion or bleeding.   Goal of Therapy:  Heparin level 0.3-0.7 units/ml aPTT 66-102 seconds Monitor platelets by anticoagulation protocol: Yes   Plan:  Increase heparin infusion to 1450 units/hr 8h aPTT Daily HL, aPTT, CBC   Andrey Cota. Diona Foley, PharmD, [place] Clinical Pharmacist Pager [phone]  10/08/2015 6:24 AM

## 2015-10-29 NOTE — Progress Notes (Signed)
Subjective: Had a small BM  Objective: Vital signs in last 24 hours: Temp:  [97.9 F (36.6 C)-99.7 F (37.6 C)] 98.4 F (36.9 C) (02/23 0326) Pulse Rate:  [26-144] 125 (02/23 0900) Resp:  [18-28] 25 (02/23 0900) BP: (64-127)/(40-98) 87/62 mmHg (02/23 0900) SpO2:  [69 %-96 %] 89 % (02/23 0900) Weight:  [97.4 kg (214 lb 11.7 oz)] 97.4 kg (214 lb 11.7 oz) (02/23 0326) Last BM Date: 11/10/15 (small stool )  Intake/Output from previous day: 02/22 0701 - 02/23 0700 In: 6130.1 [I.V.:5380.1; IV Piggyback:750] Out: 840 [Urine:840] Intake/Output this shift: Total I/O In: 412.4 [I.V.:412.4] Out: -   General appearance: cooperative Resp: few rales GI: periumbilical hernia softer, doesnt reduce  Lab Results:   Recent Labs  10/28/15 0510 November 10, 2015 0500  WBC 4.8 8.4  HGB 10.7* 10.5*  HCT 36.1 35.0*  PLT 170 162   BMET  Recent Labs  10/28/15 0510 10-Nov-2015 0500  NA 139 136  K 5.2* 4.6  CL 94* 94*  CO2 34* 27  GLUCOSE 148* 166*  BUN 81* 85*  CREATININE 3.86* 3.75*  CALCIUM 8.7* 7.7*   PT/INR No results for input(s): LABPROT, INR in the last 72 hours. ABG No results for input(s): PHART, HCO3 in the last 72 hours.  Invalid input(s): PCO2, PO2  Studies/Results: Ct Abdomen Pelvis Wo Contrast  11/03/2015  CLINICAL DATA:  Abdominal pain for 3 days EXAM: CT ABDOMEN AND PELVIS WITHOUT CONTRAST TECHNIQUE: Multidetector CT imaging of the abdomen and pelvis was performed following the standard protocol without IV contrast. COMPARISON:  03/16/2015 FINDINGS: A large ventral hernia sac containing small bowel loops is present. This results an small bowel obstruction with dilated proximal small bowel loops. On images 26 and 31 of series 11, a small segment of small bowel is obstructed into adjacent locations, within close proximity resulting in a closed loop obstruction. This is also seen on sagittal images. See image 105 of series 9. There is no free intraperitoneal gas. There is  stranding in the mesenteric suggesting venous congestion. There is is stranding within the fat contained in the hernia sac. Stranding in the overlying subcutaneous fatty tissue is again noted. Bibasilar atelectasis versus airspace disease. Confluent opacity at the posterior lung bases is worrisome for bibasilar aspiration pneumonia. There is a small amount of free fluid surrounding the liver. Liver is otherwise unremarkable. Multiple gallstones are noted. Spleen, pancreas, adrenal glands are within normal limits. Severe atrophy of the left kidney. Chronic changes of the right kidney are noted The stomach is distended and contains fluid Bladder is decompressed. Uterus is absent. Adnexa are unremarkable. Sigmoid diverticulosis. Abdominal aortic aneurysm is present. Maximal diameter is 3.5 cm. Vascular calcifications are noted No vertebral compression deformity. Multilevel degenerative disc disease. IMPRESSION: The large ventral hernia containing small bowel loops is causing a small bowel obstruction. There is an associated closed loop obstruction. Hernia sac and mesenteric fatty tissues suggesting venous congestion. Cholelithiasis Small amount of free fluid about the liver Bibasilar consolidation.  Consider aspiration. Critical Value/emergent results were called by telephone at the time of interpretation on 10/12/2015 at 4:16 pm to Dr. Joanie Coddington, who verbally acknowledged these results. Electronically Signed   By: Jolaine Click M.D.   On: 10/10/2015 16:16   Dg Chest 2 View  10/16/2015  CLINICAL DATA:  Hypotension and cough EXAM: CHEST  2 VIEW COMPARISON:  01/23/2015 FINDINGS: Chronic cardiopericardial enlargement. Diffuse interstitial coarsening compatible with mild edema. No effusions. Band of atelectasis seen in the lateral projection,  likely along the right lower major fissure. Hyperinflation correlating with COPD history. IMPRESSION: 1. Mild pulmonary edema. 2. COPD. Electronically Signed   By: Marnee Spring M.D.   On: 11/05/15 13:03   Dg Chest Port 1 View  10/17/2015  CLINICAL DATA:  Dyspnea EXAM: PORTABLE CHEST 1 VIEW COMPARISON:  10/28/2015 FINDINGS: Cardiomegaly again noted. Central mild vascular congestion mild perihilar interstitial prominence left greater than right suspicious for mild interstitial edema. Persistent left basilar atelectasis. Worsening right basilar atelectasis or infiltrate. Question small right pleural effusion. NG tube is unchanged in position. Stable left IJ central line with tip in SVC. IMPRESSION: Stable right IJ central line and NG tube position.Central mild vascular congestion mild perihilar interstitial prominence left greater than right suspicious for mild interstitial edema. Persistent left basilar atelectasis. Worsening right basilar atelectasis or infiltrate. Question small right pleural effusion. Electronically Signed   By: Natasha Mead M.D.   On: 10/08/2015 09:13   Dg Chest Port 1 View  10/28/2015  CLINICAL DATA:  Central line placement.  Initial encounter. EXAM: PORTABLE CHEST 1 VIEW COMPARISON:  Chest radiograph performed 11/05/2015 FINDINGS: A left IJ line is noted ending about the proximal SVC. The enteric tube is noted ending overlying the body of the stomach. The lungs are well-aerated. Vascular congestion is noted. Mild bibasilar airspace opacities may reflect mild interstitial edema or possibly pneumonia. There is no evidence of pleural effusion or pneumothorax. The cardiomediastinal silhouette is enlarged. No acute osseous abnormalities are seen. IMPRESSION: 1. Left IJ line noted ending about the proximal SVC. 2. Vascular congestion and cardiomegaly. Mild bibasilar airspace opacities may reflect mild interstitial edema or possibly pneumonia. Electronically Signed   By: Roanna Raider M.D.   On: 10/28/2015 04:05   Dg Abd Portable 1v  10/28/2015  CLINICAL DATA:  Small-bowel obstruction. EXAM: PORTABLE ABDOMEN - 1 VIEW COMPARISON:  CT 11/05/2015 . FINDINGS: Soft  tissue structures are unremarkable. Persistent dilated loop of bowel possibly small bowel in the left upper quadrant. Air is noted within the colon. Stool noted within the colon. Aortoiliac atherosclerotic vascular calcification. Surgical clips in the pelvis. Degenerative changes lumbar spine and both hips. IMPRESSION: 1. Dilated loop of bowel possibly small bowel is again noted in the left upper quadrant. Air is noted in the colon. Stool is noted in the colon. Reference is made to prior CT of 11-05-2015 which demonstrates a large ventral hernia with bowel obstruction. 2. Aortoiliac atherosclerotic vascular disease. Electronically Signed   By: Maisie Fus  Register   On: 10/28/2015 07:12    Anti-infectives: Anti-infectives    Start     Dose/Rate Route Frequency Ordered Stop   11/02/2015 1430  vancomycin (VANCOCIN) IVPB 1000 mg/200 mL premix  Status:  Discontinued     1,000 mg 200 mL/hr over 60 Minutes Intravenous Every 48 hours 11-05-2015 1435 2015-11-05 1842   11/03/2015 1400  vancomycin (VANCOCIN) IVPB 1000 mg/200 mL premix  Status:  Discontinued     1,000 mg 200 mL/hr over 60 Minutes Intravenous Every 48 hours 10/28/15 0057 10/28/15 1000   10/28/15 0200  piperacillin-tazobactam (ZOSYN) IVPB 2.25 g     2.25 g 100 mL/hr over 30 Minutes Intravenous 3 times per day 10/28/15 0057     11-05-15 1415  piperacillin-tazobactam (ZOSYN) IVPB 3.375 g     3.375 g 100 mL/hr over 30 Minutes Intravenous  Once 11/05/15 1410 2015-11-05 1453   2015-11-05 1415  vancomycin (VANCOCIN) 1,500 mg in sodium chloride 0.9 % 500 mL IVPB  1,500 mg 250 mL/hr over 120 Minutes Intravenous  Once 10/25/2015 1413 10/25/2015 1626      Assessment/Plan: Ventral hernia with SBO - noted cardiology input with likely prohibitive risk for any surgery. Chronic resp failure is also a significant issue. Continue NGT today. We will hold off on any surgery at this point. I spoke with her family.  LOS: 2 days    Kaylianna Detert E 10/18/2015

## 2015-10-29 NOTE — Significant Event (Signed)
PCCM INTERVAL PROGRESS NOTE   Asked to evaluate patient by [facility] MD with regards to end of life care. Mrs. Domagala was admitted with SBO 2/21 with plans for conservative management. It was felt that she would not survive surgery or be able to be liberated from ventilator due to multiple medical co-morbidities. Her condition deteriorated to require 100% FiO2 via NRB and multiple vasoactive infusions. She has had small bowel movement, but overall has worsened due to likely acute sepsis with abdominal source on top of her chronic CHF, PAH, COPD for which she requires 8LPM FiO2 at home. The patient and her family together have decided to pursue comfort measures at this time. Discussed with nursing staff. Withdrawal of life sustaining care/comfort orders placed.   Georgann Housekeeper, AGACNP-BC [facility] Pulmonology/Critical Care Pager [phone] or [phone]  10/21/2015 8:24 PM

## 2015-10-29 NOTE — Progress Notes (Addendum)
Advanced Heart Failure Rounding Note   Subjective:   70 y/o woman with morbid obesity, rheumatoid arthritis, chronic AF, diastolic HF, COPD (FEV1 4.74 in 2014), OHS/OSA, PAH admitted with SBO/sepsis.   As outpatient has been on Opsumit and Adcirca with marginal functional capacity. Last RHC with PAP 44/19, improved. LV function has been normal.   She developed respiratory failure and shock requiring NRB. Placed on Neo, Norepi, and Amio. Today's CO-OX is 57% with CVP 13.  Creatinine remains elevated 3.75. Procalcitonin 37.6. Had BM yesterday, abdominal pain much improved.   Surgery has seen and patient felt to be very high risk for surgery.    Objective:   Weight Range:  Vital Signs:   Temp:  [97.9 F (36.6 C)-99.7 F (37.6 C)] 98.4 F (36.9 C) (02/23 0326) Pulse Rate:  [26-144] 125 (02/23 0900) Resp:  [18-28] 25 (02/23 0900) BP: (64-127)/(40-98) 87/62 mmHg (02/23 0900) SpO2:  [69 %-96 %] 89 % (02/23 0900) Weight:  [214 lb 11.7 oz (97.4 kg)] 214 lb 11.7 oz (97.4 kg) (02/23 0326) Last BM Date: 10/28/2015 (small stool )  Weight change: Filed Weights   10/07/2015 2006 10/28/15 0445 10/13/2015 0326  Weight: 202 lb 2.6 oz (91.7 kg) 202 lb 2.6 oz (91.7 kg) 214 lb 11.7 oz (97.4 kg)    Intake/Output:   Intake/Output Summary (Last 24 hours) at 10/20/2015 0951 Last data filed at 10/22/2015 0900  Gross per 24 hour  Intake 6087.49 ml  Output    840 ml  Net 5247.49 ml     Physical Exam: General: Ill appearing on NRB HEENT: normal except for NGT and NRB Neck: supple. JVP to jaw . Carotids 2+ bilat; no bruits. No lymphadenopathy or thryomegaly appreciated. Cor: PMI nondisplaced. Tachy irregular distant Lungs: diffuse rhonchi  Abdomen: obese soft, nontender, nondistended. + reducible umbilical hernia.  Extremities: no cyanosis, clubbing, rash, tr edema Neuro: alert & orientedx3, cranial nerves grossly intact. moves all 4 extremities w/o difficulty. Affect pleasant  Telemetry: AF  with RVR 110s  Labs: Basic Metabolic Panel:  Recent Labs Lab 10/23/15 0945 10/13/2015 1241 10/09/2015 1903 10/28/15 0510 10/09/2015 0500  NA 141 139 145 139 136  K 4.8 5.3* 6.0* 5.2* 4.6  CL 96* 90* 93* 94* 94*  CO2 32 31 33* 34* 27  GLUCOSE 157* 180* 157* 148* 166*  BUN 47* 76* 75* 81* 85*  CREATININE 2.01* 3.80* 3.71* 3.86* 3.75*  CALCIUM 9.4 10.3 9.4 8.7* 7.7*  MG  --   --   --  2.5*  --   PHOS  --   --   --  4.8*  --     Liver Function Tests:  Recent Labs Lab 10/28/2015 1241  AST 21  ALT 15  ALKPHOS 82  BILITOT 0.9  PROT 6.6  ALBUMIN 3.1*   No results for input(s): LIPASE, AMYLASE in the last 168 hours. No results for input(s): AMMONIA in the last 168 hours.  CBC:  Recent Labs Lab 10/22/2015 1241 10/28/15 0510 10/23/2015 0500  WBC 5.6 4.8 8.4  NEUTROABS 4.3  --   --   HGB 13.2 10.7* 10.5*  HCT 43.0 36.1 35.0*  MCV 83.0 84.0 83.1  PLT 175 170 162    Cardiac Enzymes:  Recent Labs Lab 10/28/2015 1903  TROPONINI 0.05*    BNP: BNP (last 3 results)  Recent Labs  01/22/15 1518 06/08/15 1045 10/10/2015 1504  BNP 210.9* 255.8* 492.2*    ProBNP (last 3 results) No results for input(s): PROBNP  in the last 8760 hours.    Other results:  Imaging: Ct Abdomen Pelvis Wo Contrast  10/28/2015  CLINICAL DATA:  Abdominal pain for 3 days EXAM: CT ABDOMEN AND PELVIS WITHOUT CONTRAST TECHNIQUE: Multidetector CT imaging of the abdomen and pelvis was performed following the standard protocol without IV contrast. COMPARISON:  03/16/2015 FINDINGS: A large ventral hernia sac containing small bowel loops is present. This results an small bowel obstruction with dilated proximal small bowel loops. On images 26 and 31 of series 11, a small segment of small bowel is obstructed into adjacent locations, within close proximity resulting in a closed loop obstruction. This is also seen on sagittal images. See image 105 of series 9. There is no free intraperitoneal gas. There is  stranding in the mesenteric suggesting venous congestion. There is is stranding within the fat contained in the hernia sac. Stranding in the overlying subcutaneous fatty tissue is again noted. Bibasilar atelectasis versus airspace disease. Confluent opacity at the posterior lung bases is worrisome for bibasilar aspiration pneumonia. There is a small amount of free fluid surrounding the liver. Liver is otherwise unremarkable. Multiple gallstones are noted. Spleen, pancreas, adrenal glands are within normal limits. Severe atrophy of the left kidney. Chronic changes of the right kidney are noted The stomach is distended and contains fluid Bladder is decompressed. Uterus is absent. Adnexa are unremarkable. Sigmoid diverticulosis. Abdominal aortic aneurysm is present. Maximal diameter is 3.5 cm. Vascular calcifications are noted No vertebral compression deformity. Multilevel degenerative disc disease. IMPRESSION: The large ventral hernia containing small bowel loops is causing a small bowel obstruction. There is an associated closed loop obstruction. Hernia sac and mesenteric fatty tissues suggesting venous congestion. Cholelithiasis Small amount of free fluid about the liver Bibasilar consolidation.  Consider aspiration. Critical Value/emergent results were called by telephone at the time of interpretation on 10/12/2015 at 4:16 pm to Dr. Raynelle Highland, who verbally acknowledged these results. Electronically Signed   By: Marybelle Killings M.D.   On: 10/24/2015 16:16   Dg Chest 2 View  11/02/2015  CLINICAL DATA:  Hypotension and cough EXAM: CHEST  2 VIEW COMPARISON:  01/23/2015 FINDINGS: Chronic cardiopericardial enlargement. Diffuse interstitial coarsening compatible with mild edema. No effusions. Band of atelectasis seen in the lateral projection, likely along the right lower major fissure. Hyperinflation correlating with COPD history. IMPRESSION: 1. Mild pulmonary edema. 2. COPD. Electronically Signed   By: Monte Fantasia M.D.   On: 10/25/2015 13:03   Dg Chest Port 1 View  10/28/2015  CLINICAL DATA:  Dyspnea EXAM: PORTABLE CHEST 1 VIEW COMPARISON:  10/28/2015 FINDINGS: Cardiomegaly again noted. Central mild vascular congestion mild perihilar interstitial prominence left greater than right suspicious for mild interstitial edema. Persistent left basilar atelectasis. Worsening right basilar atelectasis or infiltrate. Question small right pleural effusion. NG tube is unchanged in position. Stable left IJ central line with tip in SVC. IMPRESSION: Stable right IJ central line and NG tube position.Central mild vascular congestion mild perihilar interstitial prominence left greater than right suspicious for mild interstitial edema. Persistent left basilar atelectasis. Worsening right basilar atelectasis or infiltrate. Question small right pleural effusion. Electronically Signed   By: Lahoma Crocker M.D.   On: 10/21/2015 09:13   Dg Chest Port 1 View  10/28/2015  CLINICAL DATA:  Central line placement.  Initial encounter. EXAM: PORTABLE CHEST 1 VIEW COMPARISON:  Chest radiograph performed 10/26/2015 FINDINGS: A left IJ line is noted ending about the proximal SVC. The enteric tube is noted ending overlying the body  of the stomach. The lungs are well-aerated. Vascular congestion is noted. Mild bibasilar airspace opacities may reflect mild interstitial edema or possibly pneumonia. There is no evidence of pleural effusion or pneumothorax. The cardiomediastinal silhouette is enlarged. No acute osseous abnormalities are seen. IMPRESSION: 1. Left IJ line noted ending about the proximal SVC. 2. Vascular congestion and cardiomegaly. Mild bibasilar airspace opacities may reflect mild interstitial edema or possibly pneumonia. Electronically Signed   By: Garald Balding M.D.   On: 10/28/2015 04:05   Dg Abd Portable 1v  10/28/2015  CLINICAL DATA:  Small-bowel obstruction. EXAM: PORTABLE ABDOMEN - 1 VIEW COMPARISON:  CT 10/08/2015 . FINDINGS: Soft  tissue structures are unremarkable. Persistent dilated loop of bowel possibly small bowel in the left upper quadrant. Air is noted within the colon. Stool noted within the colon. Aortoiliac atherosclerotic vascular calcification. Surgical clips in the pelvis. Degenerative changes lumbar spine and both hips. IMPRESSION: 1. Dilated loop of bowel possibly small bowel is again noted in the left upper quadrant. Air is noted in the colon. Stool is noted in the colon. Reference is made to prior CT of 11/01/2015 which demonstrates a large ventral hernia with bowel obstruction. 2. Aortoiliac atherosclerotic vascular disease. Electronically Signed   By: Marcello Moores  Register   On: 10/28/2015 07:12      Medications:     Scheduled Medications: . antiseptic oral rinse  7 mL Mouth Rinse BID  . budesonide (PULMICORT) nebulizer solution  0.5 mg Nebulization BID  . chlorhexidine  15 mL Mouth Rinse BID  . insulin aspart  0-15 Units Subcutaneous 6 times per day  . ipratropium-albuterol  3 mL Nebulization Q6H  . levothyroxine  25 mcg Intravenous QAC breakfast  . piperacillin-tazobactam (ZOSYN)  IV  2.25 g Intravenous 3 times per day  . sodium chloride  500 mL Intravenous Once     Infusions: . sodium chloride 100 mL/hr at 10/20/2015 0700  . amiodarone 30 mg/hr (10/31/2015 0700)  . heparin 1,450 Units/hr (10/20/2015 0700)  . norepinephrine (LEVOPHED) Adult infusion 8 mcg/min (10/21/2015 0700)  . phenylephrine (NEO-SYNEPHRINE) Adult infusion 180 mcg/min (10/15/2015 0700)     PRN Medications:  sodium chloride, levalbuterol, ondansetron (ZOFRAN) IV   Assessment:  1. SBO in setting of umbilical hernia: Resolving.  2. Shock: likely due to sepsis and PAH/cor pulmonale 3. Acute on chronic respiratory failure: Has COPD and OSH/OSA.  4. Acute on chronic renal failure: stage IV likely due to ATN 5. Chronic AF now with RVR 6. Rheumatoid arthritis 7. PAH: On Opsumit/Adcirca at home.   Plan/Discussion:    She is  critically ill with multi-system organ failure. Given her comorbidities her risk with surgery would be incredibly high and I would consider almost prohibitive.  However, the SBO seems to be resolving with NGT suction and bowel rest.  She had a small BM this morning and pain decreased.   She remains on norepinephrine and phenylephrine.  SBP in 90s this morning.  Suspect hypotensive due to combination of septic shock possibly from bowel source (PCT very high) and PAH/cor pulmonale.  Co-ox 57%, ok for now.  - Would continue to slowly wean down phenylephrine and up norepinephrine for better RV support.   - When BP more stable, could use IV milrinone for RV support/lowering of PA pressure.   She is off her PAH meds as NPO and hypotensive.   AKI likely due to hypotension => ATN.  Follow closely.   CVP 13 today, I/Os very positive.  Pulmonary edema on  CXR.  Very tenuous, she is on a lot of oxygen at baseline at home.  Worsening of respiratory status would be terminal.  She is DNI.   - I am going to give her a dose of Lasix 80 mg IV x 1 to try to effect some diuresis, will follow response.   35 minutes critical care time.   Length of Stay: 2  [name] 10/08/2015 10:31 AM  Advanced Heart Failure Team Pager [phone] (M-F; 7a - 4p)  Please contact [facility] for night-coverage after hours (4p -7a ) and weekends on [email or web address]

## 2015-10-29 NOTE — Progress Notes (Signed)
10/23/2015 1959  Clinical Encounter Type  Visited With Patient and family together  Visit Type Spiritual support  Referral From Nurse  Spiritual Encounters  Spiritual Needs Prayer;Emotional;Grief support  Stress Factors  Patient Stress Factors Major life changes  Family Stress Factors Major life changes  Chaplain called to say prayer with patient and family after patient indicated a desire to let go and receive only comfort care. Patient alert and able to joke with family. Family tearful but seemed at peace and very supportive of patient. Chaplain honored to say prayer with them. Remained for a little while after in order to offer additional grief support. Elbert Spickler, Chaplain

## 2015-10-29 NOTE — Progress Notes (Addendum)
Responded to spiritual consult to explain to patient's family the Advance Directives document and its process.  AD form was left with patient's sister and family will have nurse page Chaplain when patient is in a better medical state.  Per patient's nurse patient is not coherent enough at this time.  Patient is in and out of confusion. Prayed with patient and family per their request. Provided ministry of presence, emotional and spiritual support to patient and family. Will follow as needed.   10/11/2015 1600  Clinical Encounter Type  Visited With Patient and family together;Health care provider  Visit Type Initial;Spiritual support  Referral From Nurse  Spiritual Encounters  Spiritual Needs Literature;Prayer;Emotional  Stress Factors  Patient Stress Factors None identified  Family Stress Factors Health changes  Advance Directives (For Healthcare)  Does patient have an advance directive? No  Would patient like information on creating an advanced directive? Yes - Educational materials given  Cristopher Peru, Pager [phone]

## 2015-10-29 NOTE — Progress Notes (Signed)
PULMONARY / CRITICAL CARE MEDICINE   Name: Tracey Duncan MRN: [id number] DOB: March 05, 1946    ADMISSION DATE:  11/01/2015 CONSULTATION DATE:  10/07/2015  REFERRING MD:  EDP  CHIEF COMPLAINT:  Abd pain  HISTORY OF PRESENT ILLNESS:  Tracey Duncan is a 70 y.o. 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 Lamonte Sakai as well as heart failure team Dr. Aundra Dubin and Dr. Haroldine Laws).  She presented to [facility] ED 02/21 with abdominal pain, nausea, vomiting. In ED, CT of the abdomen revealed SBO.  She was seen in consultation by general surgery who recommended medical / conservative management.  In addition to her SBO, she had high O2 demands (NRB mask) as well as hypotension that was minimally responsive to fluids.  Lactate was initially elevated at 3.25 and decreased to 2.38 on repeat.  She has not had any fevers/chills/sweats and lab work reveals no leukocytosis.  After 3L IVF, SBP remained in 70's; therefore, PCCM called for admission.  SUBJECTIVE:  States she feels better; has passed gas and had small BM.  More confused this AM Increased WOB and secretions  VITAL SIGNS: BP 70/56 mmHg  Pulse 35  Temp(Src) 98.4 F (36.9 C) (Axillary)  Resp 23  Ht _0  (1.626 m)  Wt 214 lb 11.7 oz (97.4 kg)  BMI 36.84 kg/m2  SpO2 84%  HEMODYNAMICS: CVP:  [7 mmHg-13 mmHg] 10 mmHg  VENTILATOR SETTINGS:    INTAKE / OUTPUT: I/O last 3 completed shifts: In: 7756.7 [I.V.:6956.7; IV [id number]:347] Out: 4259 [Urine:865; Emesis/NG output:200]   PHYSICAL EXAMINATION:  General: Adult female, in NAD.  Neuro: Non-focal, alert, confused but oriented.  HEENT: [place]/AT.EOMI. MM dry. NRB. Cardiovascular: IRIR, tachy, no M/R/G.  Lungs: Respirations even and unlabored. Coarse bilaterally with rhonchi. Abdomen: BS hypoactive.  Abd soft with no rebound or guarding. Umbilical hernia.  Musculoskeletal: No gross deformities, no edema.  Skin: Intact, warm, no  rashes.  LABS:  BMET  Recent Labs Lab 10/25/2015 1903 10/28/15 0510 10/21/2015 0500  NA 145 139 136  K 6.0* 5.2* 4.6  CL 93* 94* 94*  CO2 33* 34* 27  BUN 75* 81* 85*  CREATININE 3.71* 3.86* 3.75*  GLUCOSE 157* 148* 166*    Electrolytes  Recent Labs Lab 10/07/2015 1903 10/28/15 0510 10/26/2015 0500  CALCIUM 9.4 8.7* 7.7*  MG  --  2.5*  --   PHOS  --  4.8*  --     CBC  Recent Labs Lab 11/01/2015 1241 10/28/15 0510 10/14/2015 0500  WBC 5.6 4.8 8.4  HGB 13.2 10.7* 10.5*  HCT 43.0 36.1 35.0*  PLT 175 170 162    Coag's  Recent Labs Lab 10/28/15 1050 10/28/15 2048 11/01/2015 0500  APTT 34 46* 53*    Sepsis Markers  Recent Labs Lab 10/26/2015 1903 10/28/15 0510 10/28/15 1423 10/28/15 1806 10/28/15 2057  LATICACIDVEN 2.0  --  2.2* 2.2* 2.2*  PROCALCITON 8.01 7.26  --   --   --     ABG No results for input(s): PHART, PCO2ART, PO2ART in the last 168 hours.  Liver Enzymes  Recent Labs Lab 10/20/2015 1241  AST 21  ALT 15  ALKPHOS 82  BILITOT 0.9  ALBUMIN 3.1*    Cardiac Enzymes  Recent Labs Lab 10/09/2015 1903  TROPONINI 0.05*    Glucose  Recent Labs Lab 10/28/15 0751 10/28/15 1211 10/28/15 1526 10/28/15 2034 10/28/15 2344 10/26/2015 0330  GLUCAP 113* 138* 126* 169* 168* 175*  Imaging No results found.  STUDIES:  CT A / P 02/21 > large ventral hernia containing small bowel loop causing SBO with associated closed loop obstruction. Cholelithiasis with small amount of free fluid about the liver. CXR 02/21 > COPD, chronic changes.  CULTURES: Blood 02/21 > NGTD Urine 02/21 > NGTD  ANTIBIOTICS: Vanc 2/21>> 2/22 Zosyn 2/21>>  SIGNIFICANT EVENTS: 02/21 > admitted with SBO and hypotension (favored due to hypovolemia from decreased PO intake).  LINES/TUBES: CVL 02/22 > NGT R Nare 2/21 > PIV x3  ASSESSMENT / PLAN:  PULMONARY A: Acute on chronic hypoxemic respiratory failure - on 6-8L home O2. Hx O2 dependent COPD, OSA with  probable component OHS, PAH (on opsumit and adcirca). Former smoker. P:   Continue supplemental O2 as needed to maintain SpO2 > 85%. NRB currently. Unable to wean off NRB.  DuoNebs / Levalbuterol / Budesonide. Hold outpatient advair, spiriva, albuterol. Patient is a do not INTUBATE. Hold off on ABG collection; will not change therapy  CARDIOVASCULAR A:  Hypotension - favor due to hypovolemia from decreased PO intake. Hx A.fib - failed DCCV in 2004 and has since been on xarelto. Hx HTN, HLD, cor pulmonale. Severe pulmonary hypertension P:  Caution with aggressive IVF resuscitation given her respiratory status. CVP 13 this AM Lactate improved  Amio for rate control. Cardiology following Levophed started for RV and BP support. Wean neo to off Restart Heparin gtt; will need to hold if going to surgery Repeat echo pending Hold outpatient bisoprolol, eliquis, macitentan metolazone, adcirca, torsemide.  RENAL A:   AoCKD. - stable Hyperkalemia - resolved P:   KVO Trend BMP Lasix  GASTROINTESTINAL A:   Chronic ventral hernia - now with SBO. Improving GI prophylaxis.  P:   General surgery following, appreciate the assistance. - High risk for surgery Continue medical / conservative therapies for now. NPO. NG suctioning Monitor abd and bowels Can consider feeding tomorrow if stable  HEMATOLOGIC A:   On chronic anticoagulation due to A.fib. VTE Prophylaxis. P:  Heparin gtt in lieu of outpatient eliquis SCD's / heparin gtt  Trend CBC  INFECTIOUS A:   Hypotension - favor due to hypovolemia from decreased PO intake.  Despite SIRS, doubt sepsis as no hx to support, no fever, no leukocytosis. P:   Continue Abx in setting of high Pct ([place]) Increase Pct this AM most likely due to bowel  Follow cultures as above.  ENDOCRINE A:   DM. Hypothyroidism.  P:   SSI. Continue outpatient synthroid, IV formulation. Hold outpatient metformin.  NEUROLOGIC A:   Hx  depression, RLS. At risk for delirium P:   Hold outpatient gabapentin, metaxalone, oxycodone, zoloft. Orient and monitor mental status  AUTOIMMUNE A: Hx RA - on leflunomide. P: Volo.   Family updated: Family at bedside for rounds Interdisciplinary Family Meeting v Palliative Care Meeting:  Due by: 02/27.   Luiz Blare, DO 10/25/2015, 7:20 AM PGY-2, [facility]

## 2015-10-29 NOTE — Progress Notes (Signed)
Patient's is alert, oriented to herself and place. Patient expresses wishes "to chose comfort and pass in peace". Chaplain Dominga Ferry called to bedside and witnessed this as well. Also, Deboraha Sprang RN witnessed patient's desires and wishes. I believe patient is in her right mind in regards to my neuro assessment. Will continue to monitor and assess.

## 2015-11-01 LAB — CULTURE, BLOOD (ROUTINE X 2)
CULTURE: NO GROWTH
Culture: NO GROWTH

## 2015-11-03 ENCOUNTER — Ambulatory Visit: Payer: Medicare Other | Admitting: Emergency Medicine

## 2015-11-04 ENCOUNTER — Telehealth: Payer: Self-pay

## 2015-11-04 NOTE — Progress Notes (Signed)
Morphine IV wasted [id number] witnessed by Deboraha Sprang, RN and Loletta Specter, RN

## 2015-11-04 NOTE — Telephone Encounter (Signed)
On 11/04/2015 I received a death certificate from [facility] (original). The death certificate is for burial. The patient is a patient of Doctor Elsworth Soho. The death certificate will be taken to Pulmonary Unit today for signature.  On 11-24-15 I received the death certificate back from Doctor Elsworth Soho. I got the death certificate ready and called the funeral home to let them know the death certificate is ready for pickup.

## 2015-11-04 DEATH — deceased

## 2015-11-10 ENCOUNTER — Other Ambulatory Visit: Payer: Self-pay | Admitting: Cardiology

## 2015-11-14 ENCOUNTER — Other Ambulatory Visit: Payer: Self-pay | Admitting: Internal Medicine

## 2015-11-18 ENCOUNTER — Telehealth: Payer: Self-pay | Admitting: Emergency Medicine

## 2015-11-18 NOTE — Telephone Encounter (Signed)
Spoke with pt's sister. Advised her that we could take the unopened, unexpired medication from her. She is going to take the expired medication to the police department or a local pharmacy and see if they will dispose of it. The portable neb machine she will try to find someone who needs it. Nothing further was needed.

## 2015-11-26 ENCOUNTER — Encounter (HOSPITAL_COMMUNITY): Payer: Medicare Other

## 2015-12-05 NOTE — Discharge Summary (Signed)
Physician Discharge Summary  Patient ID: [name] MRN: [id number] DOB/AGE: 1946/03/27 70 y.o.  Admit date: 10/26/2015 Discharge date: 2015/11/22  Admission Diagnoses: Abdominal pain 2/2 SBO  Discharge Diagnoses:  Active Problems:   SBO (small bowel obstruction) (HCC)   Sepsis (HCC)   Acute on chronic renal failure (HCC)   Hyperkalemia   AKI (acute kidney injury) ([place])   Acute respiratory failure with hypoxia [facility])   Discharged Condition: Deceased   Hospital Course:  Tracey Duncan was a 70 year old woman with morbid obesity, rheumatoid arthritis, chronic AF, diastolic HF, severe COPD (FEV1 1.38 in 11-07-2012), OHS/OSA, PAH admitted with small bowel obstruction. Patient was not an ideal candidate for surgery given her high risk and multiple comorbidities. Her SBO was treated with conservative measures. She had small bowel movement, but overall had worsened due to acute sepsis with likely abdominal source. Her condition deteriorated to require 100% FiO2 via NRB and multiple vasoactive infusions. Acute respiratory failure due to underlying COPD and severe pulmonary hypertension. Patient was a do not intubate/DNR. Patient was unable to wean off rebreather.   Family and patient decided to move toward palliation and comfort measures. Patient's time of death around 11/07/2298 on 11/22/15.   Consults: cardiology, pulmonary/intensive care and general surgery  Significant Diagnostic Studies:  CT A/P 02/21 >> large ventral hernia containing small bowel loop causing SBO with associated closed loop obstruction. Cholelithiasis with small amount of free fluid about the liver.  Blood 02/21 > NGTD Urine 02/21 > NGTD  Discharge Exam: (Based off exam prior that day) Blood pressure 105/63, pulse 116, temperature 99 F (37.2 C), temperature source Oral, resp. rate 22, height _0  (1.626 m), weight 214 lb 11.7 oz (97.4 kg), SpO2 99 %. General: Adult female, in NAD.  Neuro: Non-focal, alert, confused but  oriented.  HEENT: [place]/AT.EOMI. MM dry. NRB. Cardiovascular: IRIR, tachy, no M/R/G.  Lungs: Respirations even and unlabored. Coarse bilaterally with rhonchi. Abdomen: BS hypoactive. Abd soft with no rebound or guarding. Umbilical hernia.  Musculoskeletal: No gross deformities, no edema.  Skin: Intact, warm, no rashes.  Disposition: 20-Expired   Signed: Luiz Blare, DO PGY-2, [place]

## 2015-12-29 ENCOUNTER — Telehealth (HOSPITAL_COMMUNITY): Payer: Self-pay | Admitting: *Deleted

## 2015-12-29 NOTE — Telephone Encounter (Signed)
Pt's sister Reino Bellis called, she would really like to talk to Dr Aundra Dubin about her sister (the pt's) death.  She can be reached at work [phone] if before 5 pm, or on cell after 5 at [place].  Will send message to him

## 2016-03-04 ENCOUNTER — Encounter: Payer: Medicare Other | Admitting: Internal Medicine

## 2016-11-17 IMAGING — CR DG CHEST 2V
2 series · 2 of 2 positions shown · non-contrast
Comparison: None.

CLINICAL DATA: Heart failure.

EXAM:
CHEST  2 VIEW

[chest pa]
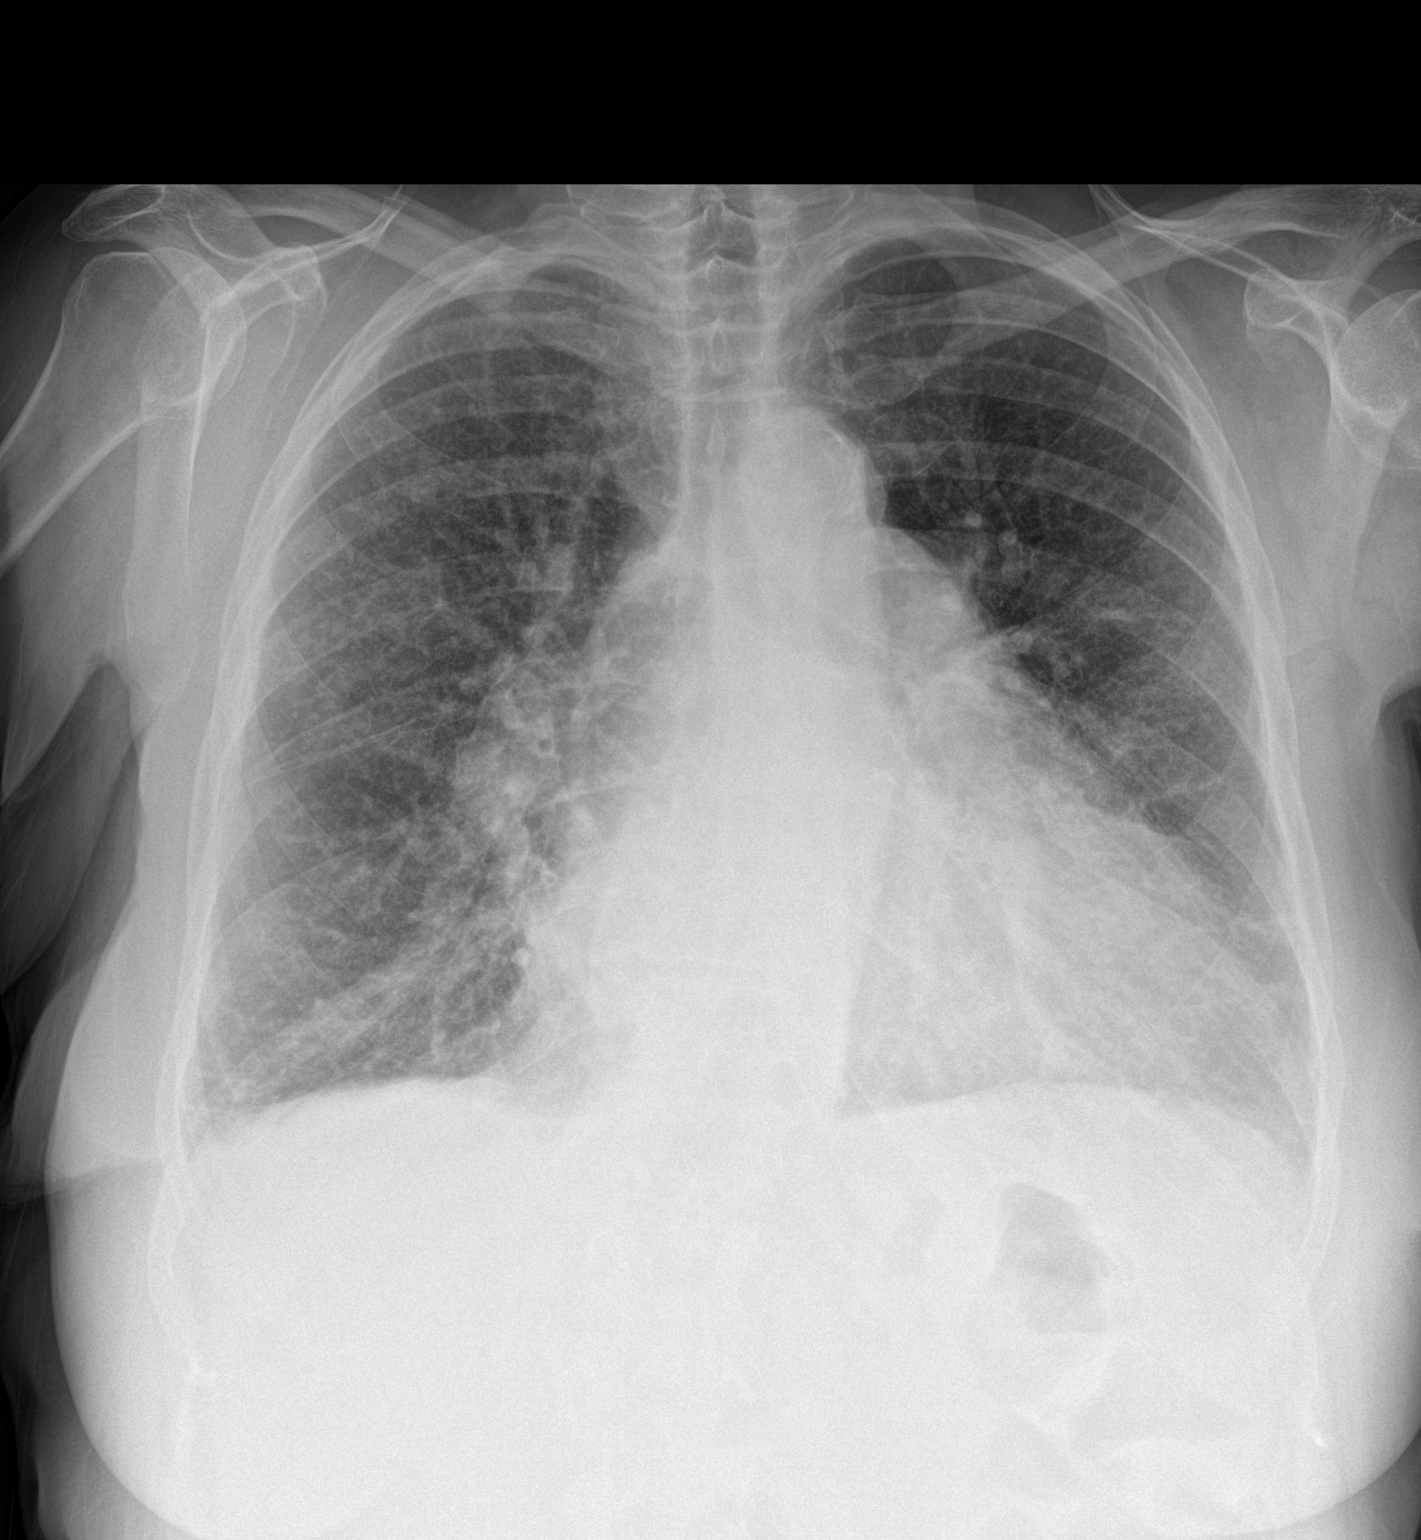

[chest lat]
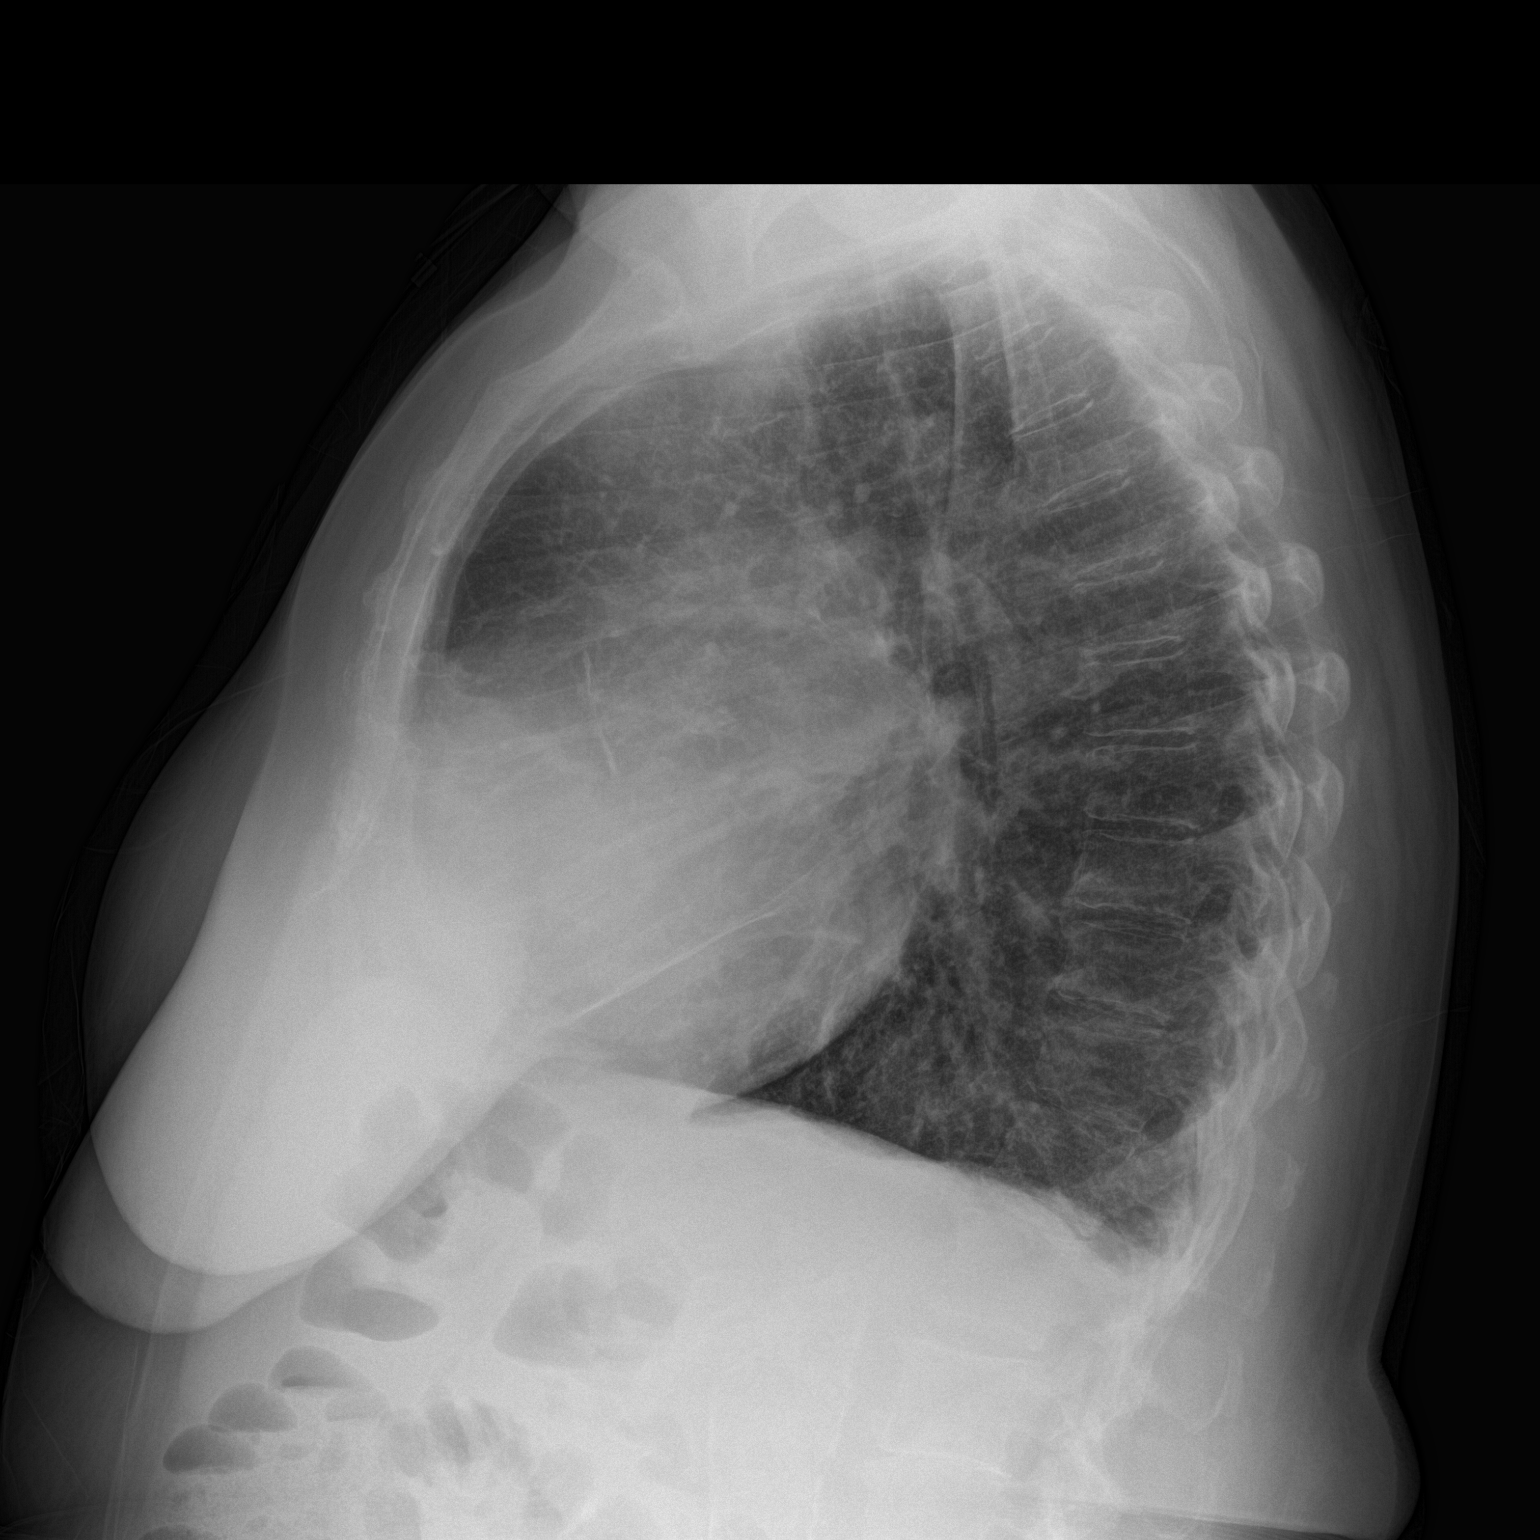

[2 of 2 positions shown; findings below may reference images not displayed]

FINDINGS: Mediastinum and hilar structures are normal. Cardiomegaly with
pulmonary vascular prominence and diffuse interstitial prominence
including Kerley B-lines. These findings are consistent with
congestive heart failure. Previously identified right pleural
effusion has cleared. No acute bony abnormality .
IMPRESSION: Congestive heart failure with pulmonary interstitial edema.
Previously identified right pleural effusion has cleared.

## 2017-01-31 IMAGING — CR DG CHEST 2V
2 series · 2 of 2 positions shown · non-contrast
Comparison: 11/06/2014

CLINICAL DATA: severe sob, has been saturating anywhere from the
70's to the low 90"s, is on O2 in the home, has a mask on tonight,
hx chf, htn, copd, is getting lasix as soon as she gets back
upstairs

EXAM:
CHEST - 2 VIEW

[chest pa]
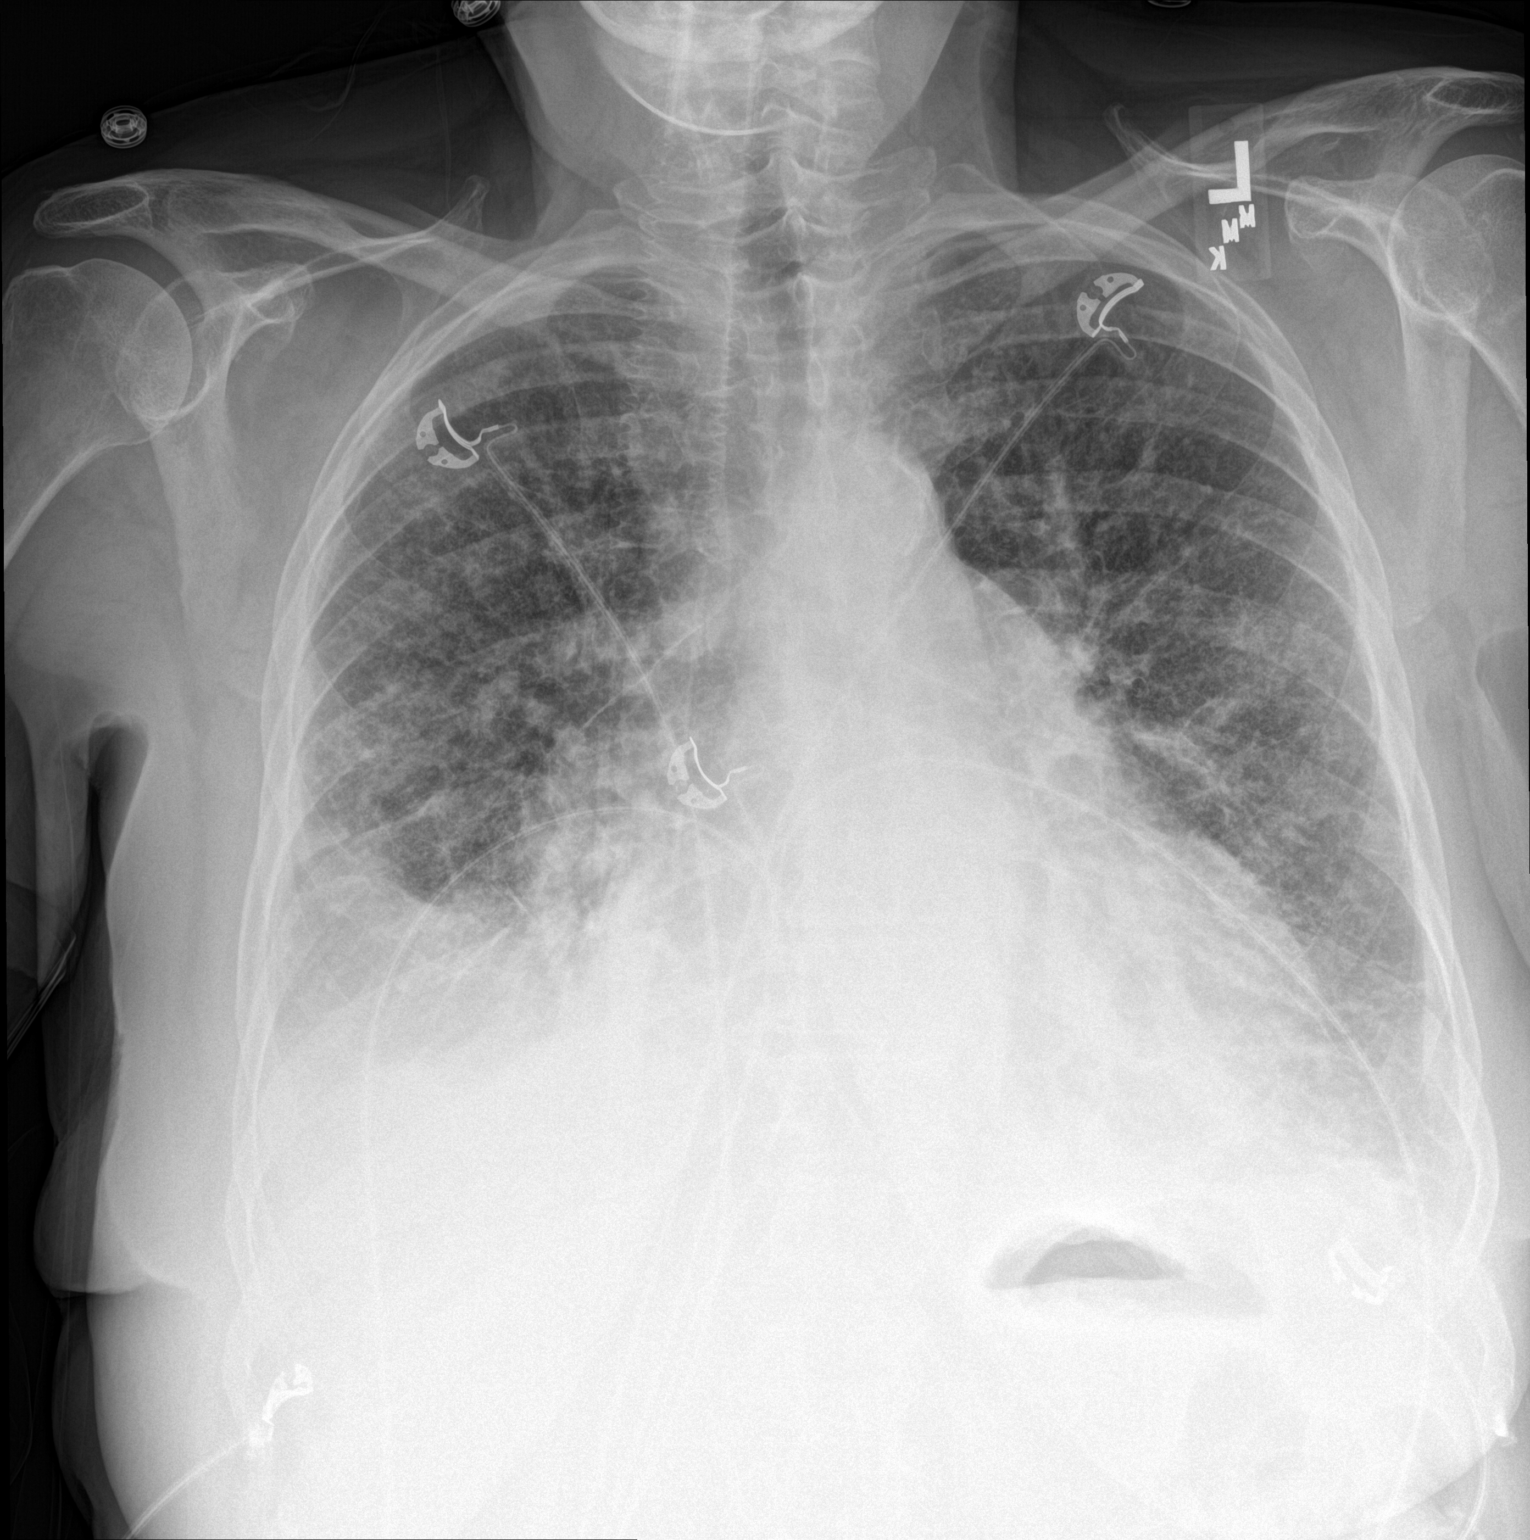

[chest lat]
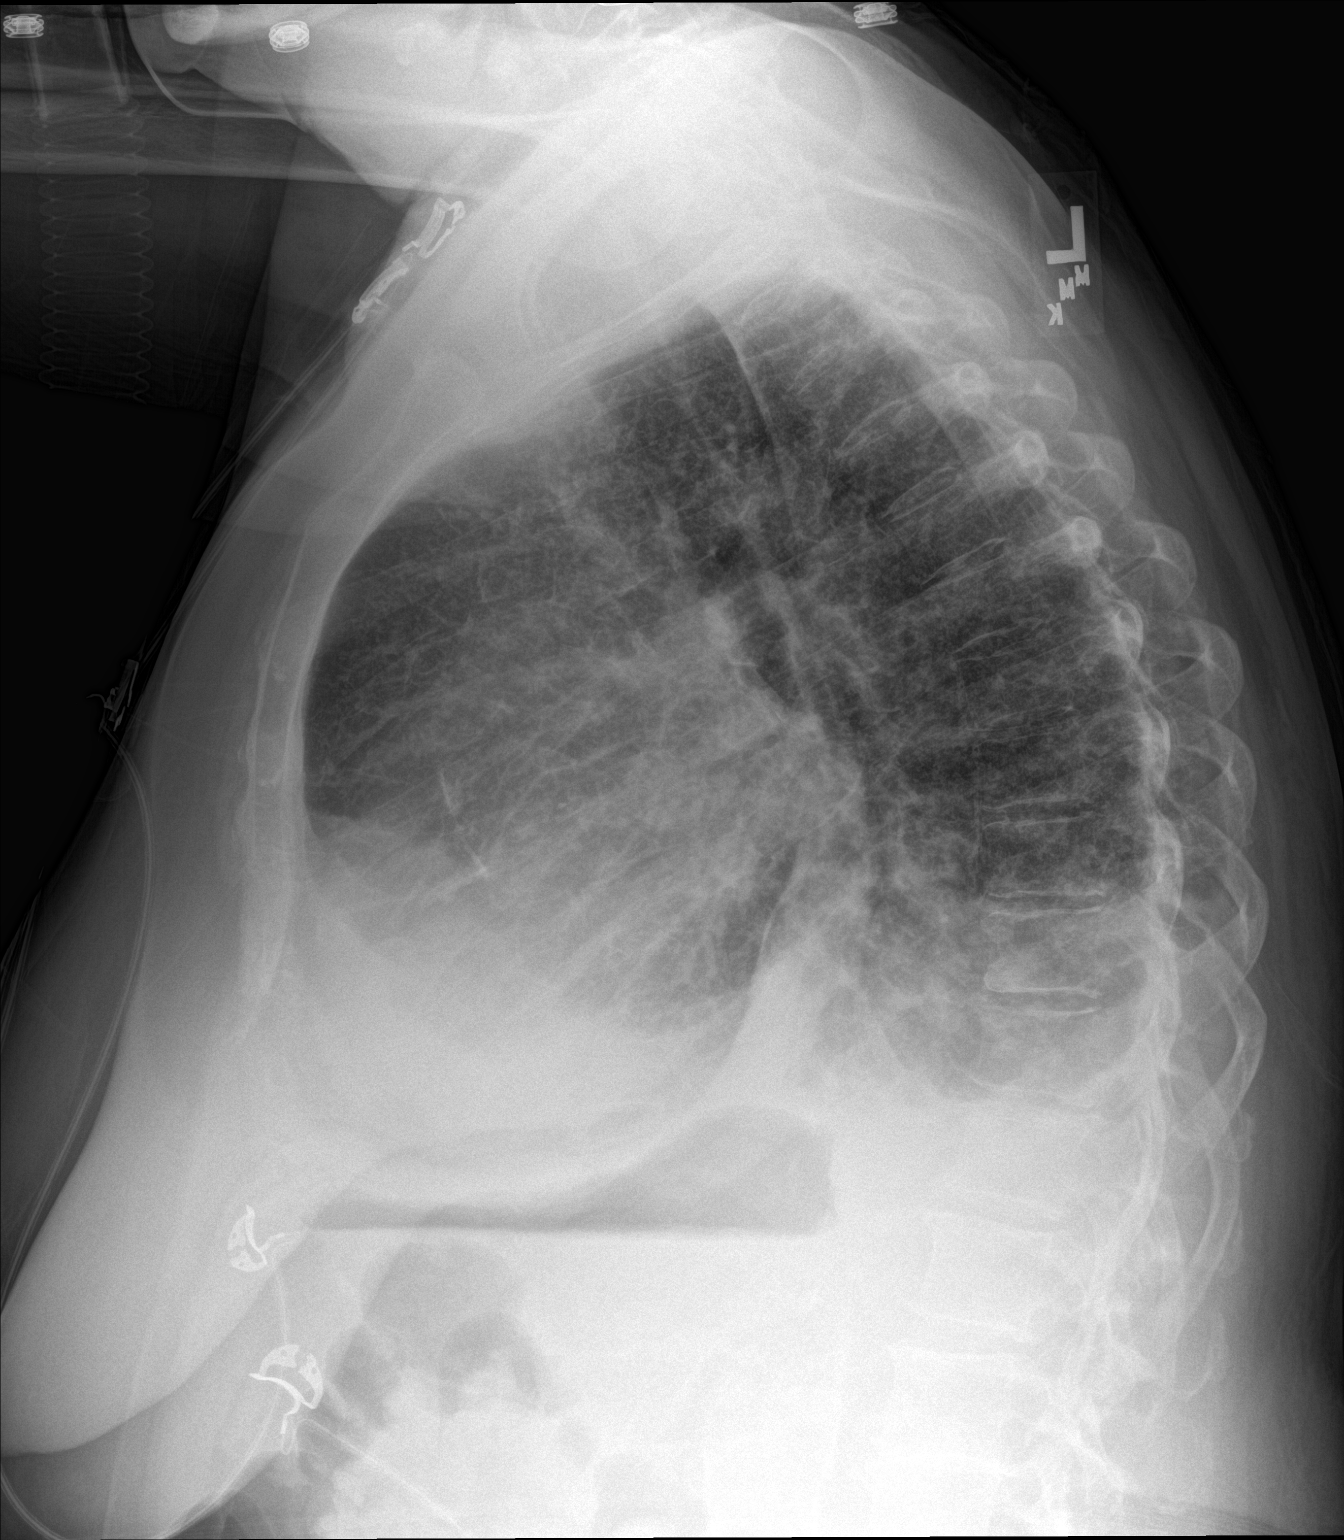

[2 of 2 positions shown; findings below may reference images not displayed]

FINDINGS: Increasing pleural effusions, moderate right and small left. Stable
cardiomegaly. Worsening bilateral interstitial and airspace edema or
infiltrates involving bases more than apices. New consolidation/
atelectasis in basilar segments of both lower lobes.
No pneumothorax.  Atheromatous aorta.
Visualized skeletal structures are unremarkable.
IMPRESSION: 1. Worsening bilateral edema/infiltrates and asymmetric pleural
effusions.
2. Stable cardiomegaly

## 2017-03-27 IMAGING — CT CT ABD-PELV W/O CM
2 of 7 series · 14 of 46 positions shown, 18 images · non-contrast
Comparison: None

CLINICAL DATA: [DATE] days of rectal bleeding.

EXAM:
CT ABDOMEN AND PELVIS WITHOUT CONTRAST
TECHNIQUE: Multidetector CT imaging of the abdomen and pelvis was performed
following the standard protocol without IV contrast.

[Series 2: ap without · axial · non-contrast · 0.68mm/px · z∈[-475,-125]mm · 11 of 84 slices shown, 15 images]
[im 9/84  soft-tissue]
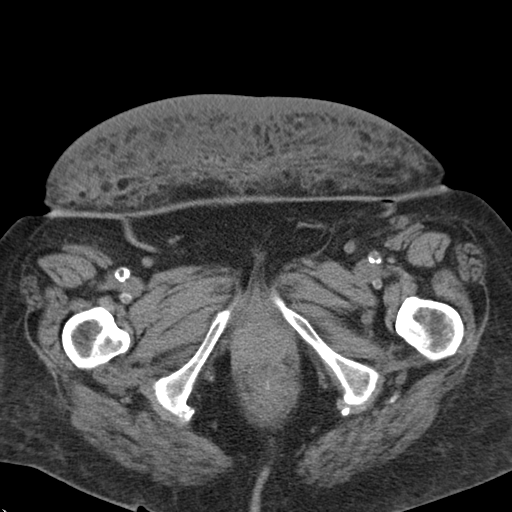
[im 9/84  bone]
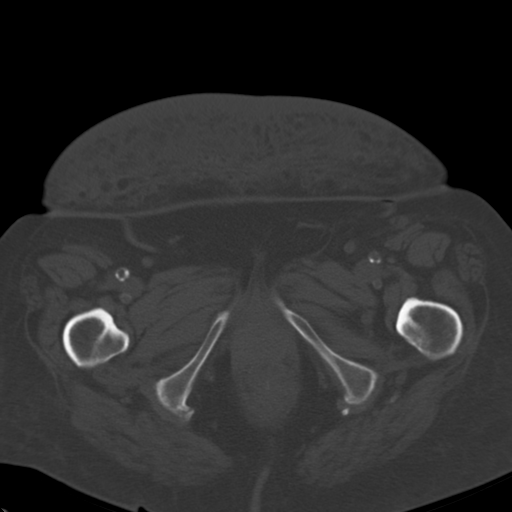
[im 17/84  soft-tissue]
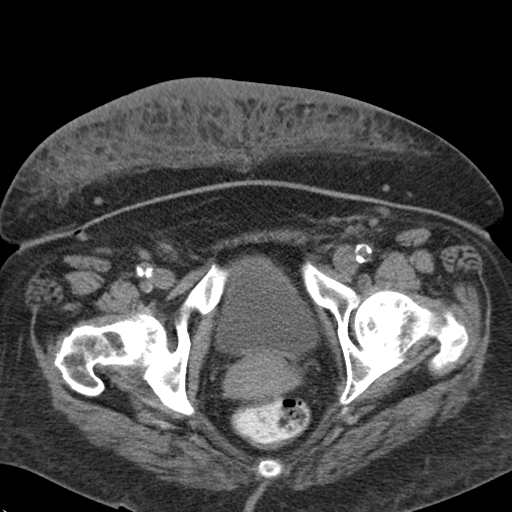
[im 25/84  soft-tissue]
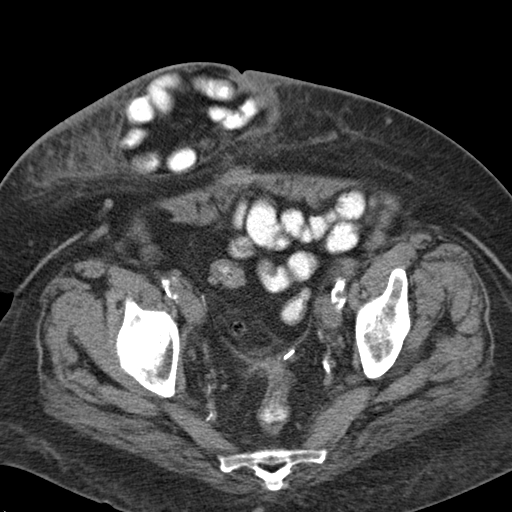
[im 34/84  soft-tissue]
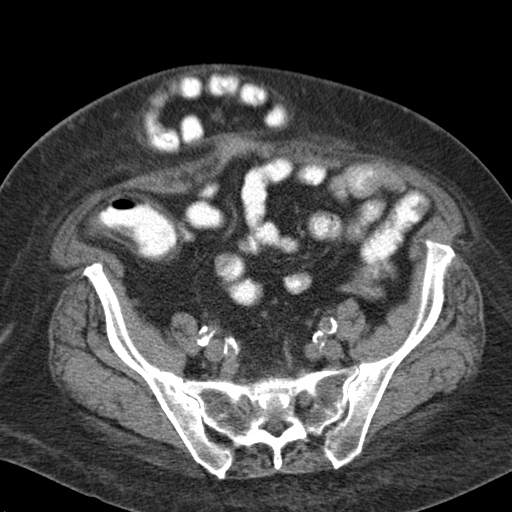
[im 42/84  soft-tissue]
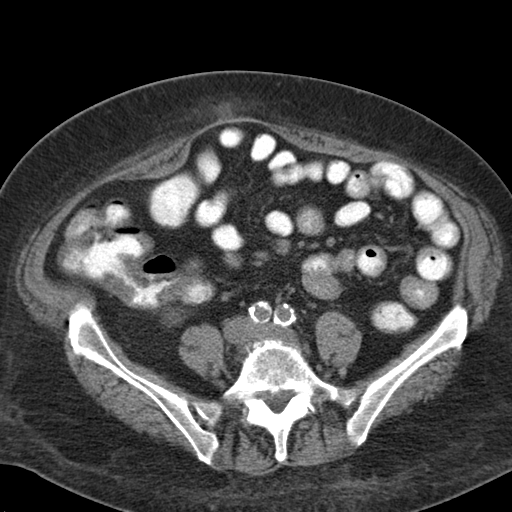
[im 50/84  soft-tissue]
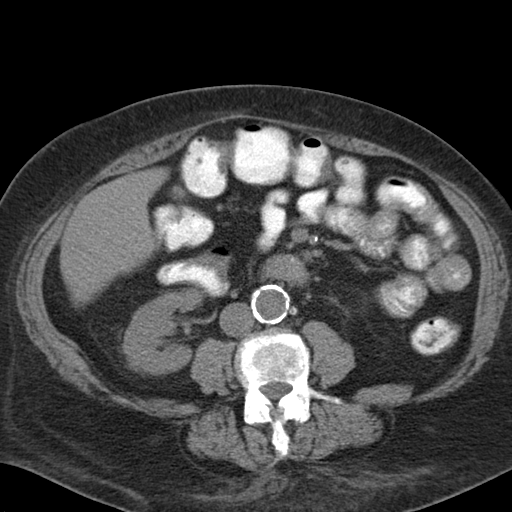
[im 59/84  soft-tissue]
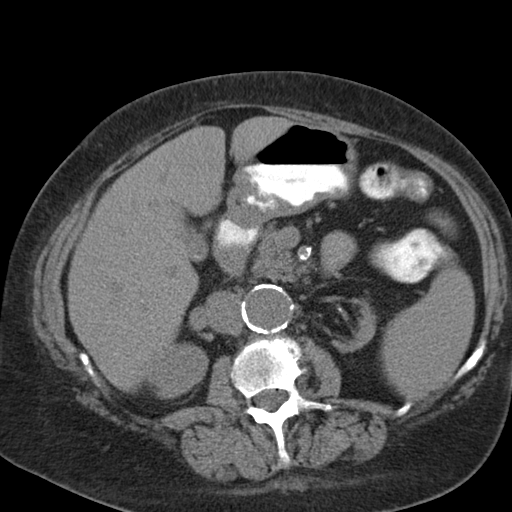
[im 67/84  soft-tissue]
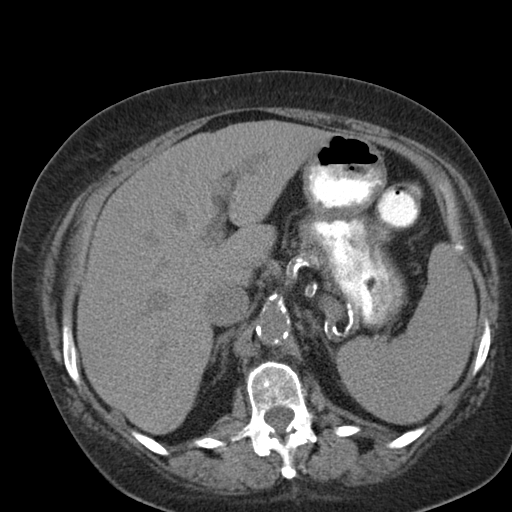
[im 67/84  lung]
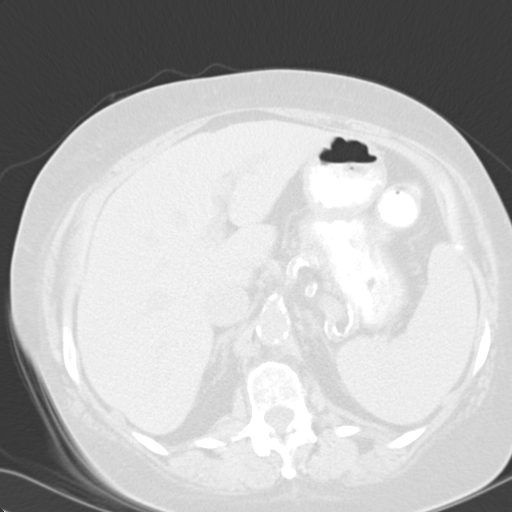
[im 71/84  lung]
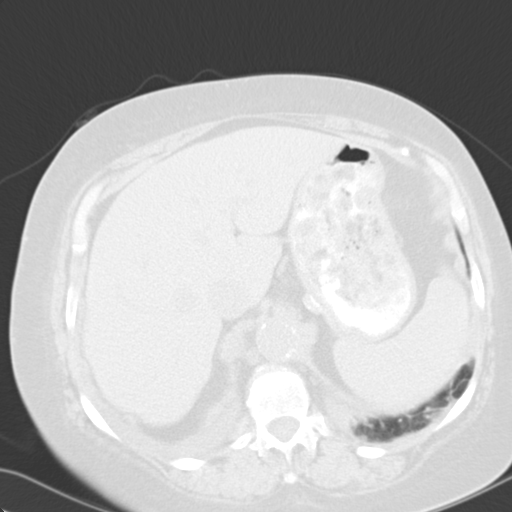
[im 75/84  soft-tissue]
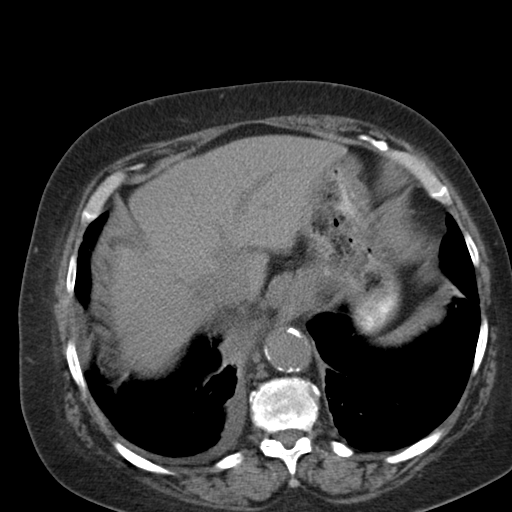
[im 75/84  lung]
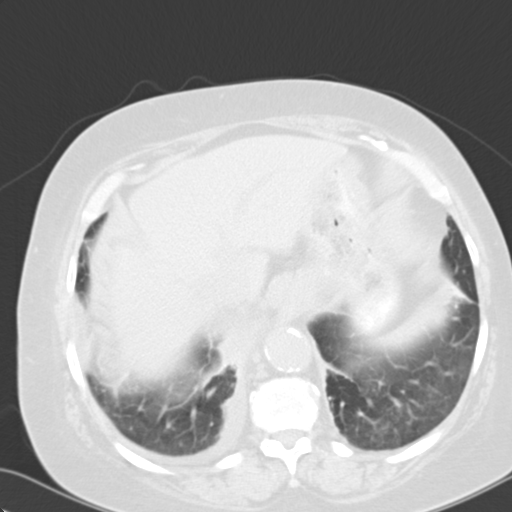
[im 75/84  bone]
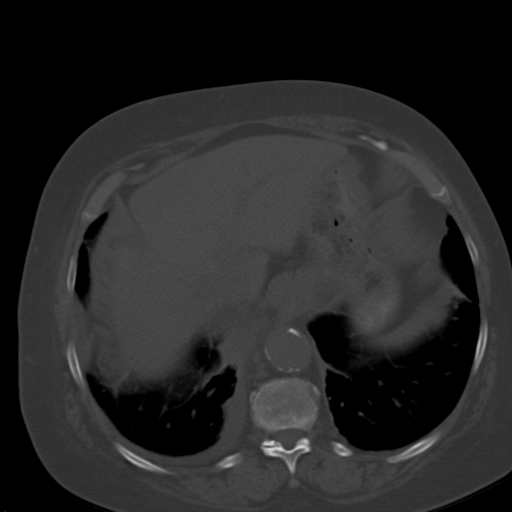
[im 79/84  lung]
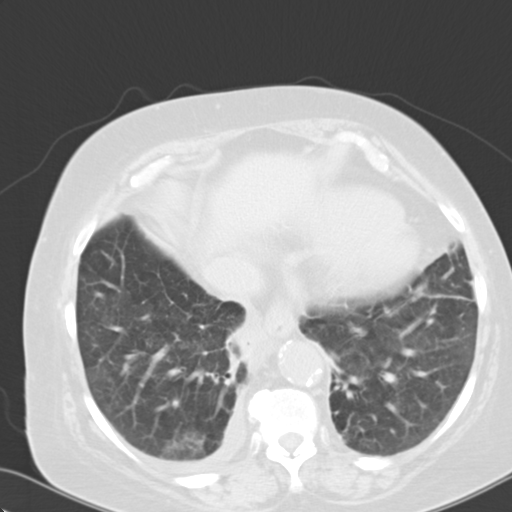

[Series 602: cor · coronal · 0.85mm/px · 3 of 152 slices shown]
[im 38/152  soft-tissue]
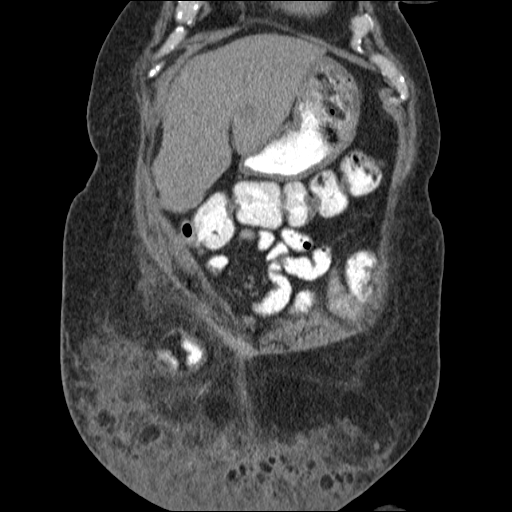
[im 76/152  soft-tissue]
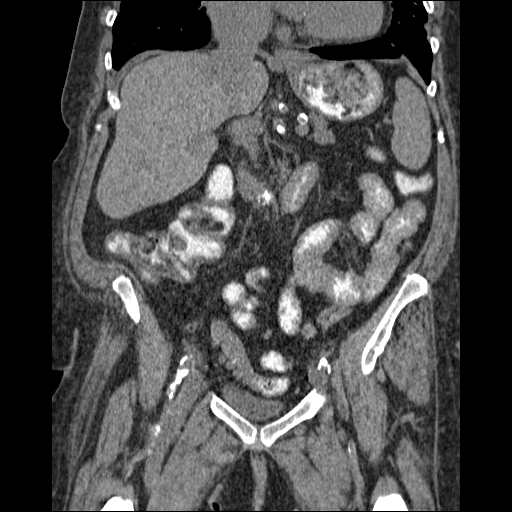
[im 114/152  soft-tissue]
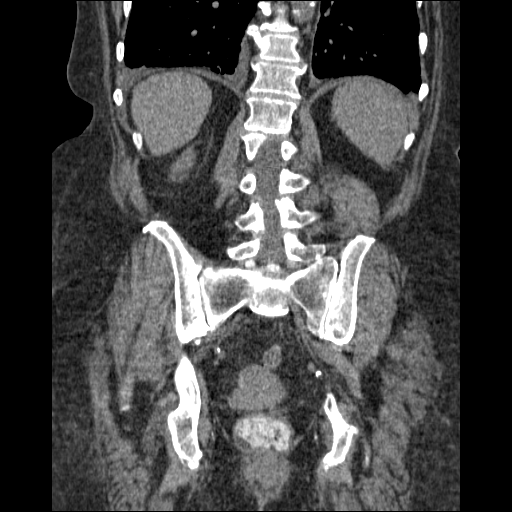

[14 of 46 positions shown; findings below may reference images not displayed]

FINDINGS: Lower chest: Small right pleural effusion noted. There is mild
diffuse interstitial thickening within both lung bases.

Hepatobiliary: No focal liver abnormality. Stones are identified
within the gallbladder. No biliary dilatation.

Pancreas: Normal appearance of the pancreas.

Spleen: The spleen is on unremarkable.

Adrenals/Urinary Tract: The adrenal glands are both normal. Atrophic
and end-stage left kidney noted. There is a calcification within the
upper pole of right kidney. Likely vascular. No right hydronephrosis
identified. Urinary bladder appears normal.

Stomach/Bowel: The stomach is normal. Normal caliber of the small
bowel loops. There is a large umbilical hernia which contains
nonobstructed loops of small bowel, image 56/series 2. The appendix
is visualized and appears normal. Normal appearance of the proximal
colon. There are a few distal colonic diverticula noted without
evidence for acute inflammation.

Vascular/Lymphatic: Calcified atherosclerotic disease involves the
abdominal aorta. The infrarenal abdominal aorta has a maximum AP
dimension of 3.5 cm, image 25/series 2.

Reproductive: Previous hysterectomy.  No adnexal mass noted.

Other: There is no ascites or focal fluid collections within the
abdomen or pelvis. There is marked diffuse skin thickening and
subcutaneous fat stranding involving the patient's pannus. No fluid
collection identified.

Musculoskeletal: Review of the visualized bony structures is
significant for lumbar degenerative disc disease. This is most
advanced at L2-3 and L3-4.
IMPRESSION: 1. No findings identified to explain rectal bleeding.
2. Cellulitis involving patient's pannus.  No abscess noted.
3. Large umbilical hernia containing nonobstructed loops of small
bowel.
4. Aortic atherosclerosis.
5. Abdominal aortic aneurysm. Recommend followup by ultrasound in 2
years. This recommendation follows ACR consensus guidelines: White
Paper of the ACR Incidental Findings Committee II on Vascular
Findings. [HOSPITAL] 6599; [DATE].
